# Patient Record
Sex: Female | Born: 1950 | Race: White | Hispanic: No | Marital: Married | Smoking: Never smoker
Health system: Southern US, Community
[De-identification: ages and names within clinical notes are randomized; demographics above are authoritative.]

## PROBLEM LIST (undated history)

## (undated) DIAGNOSIS — N6012 Diffuse cystic mastopathy of left breast: Secondary | ICD-10-CM

## (undated) DIAGNOSIS — E119 Type 2 diabetes mellitus without complications: Secondary | ICD-10-CM

## (undated) DIAGNOSIS — R011 Cardiac murmur, unspecified: Secondary | ICD-10-CM

## (undated) DIAGNOSIS — Z9889 Other specified postprocedural states: Secondary | ICD-10-CM

## (undated) DIAGNOSIS — M1990 Unspecified osteoarthritis, unspecified site: Secondary | ICD-10-CM

## (undated) DIAGNOSIS — Z5189 Encounter for other specified aftercare: Secondary | ICD-10-CM

## (undated) DIAGNOSIS — H269 Unspecified cataract: Secondary | ICD-10-CM

## (undated) DIAGNOSIS — F419 Anxiety disorder, unspecified: Secondary | ICD-10-CM

## (undated) DIAGNOSIS — R112 Nausea with vomiting, unspecified: Secondary | ICD-10-CM

## (undated) DIAGNOSIS — R0600 Dyspnea, unspecified: Secondary | ICD-10-CM

## (undated) DIAGNOSIS — T7840XA Allergy, unspecified, initial encounter: Secondary | ICD-10-CM

## (undated) DIAGNOSIS — K760 Fatty (change of) liver, not elsewhere classified: Secondary | ICD-10-CM

## (undated) DIAGNOSIS — T8859XA Other complications of anesthesia, initial encounter: Secondary | ICD-10-CM

## (undated) DIAGNOSIS — N6011 Diffuse cystic mastopathy of right breast: Secondary | ICD-10-CM

## (undated) DIAGNOSIS — K219 Gastro-esophageal reflux disease without esophagitis: Secondary | ICD-10-CM

## (undated) DIAGNOSIS — R7303 Prediabetes: Secondary | ICD-10-CM

## (undated) DIAGNOSIS — I454 Nonspecific intraventricular block: Secondary | ICD-10-CM

## (undated) DIAGNOSIS — D649 Anemia, unspecified: Secondary | ICD-10-CM

## (undated) DIAGNOSIS — F329 Major depressive disorder, single episode, unspecified: Secondary | ICD-10-CM

## (undated) DIAGNOSIS — R739 Hyperglycemia, unspecified: Secondary | ICD-10-CM

## (undated) DIAGNOSIS — K834 Spasm of sphincter of Oddi: Secondary | ICD-10-CM

## (undated) DIAGNOSIS — Z90710 Acquired absence of both cervix and uterus: Secondary | ICD-10-CM

## (undated) DIAGNOSIS — F32A Depression, unspecified: Secondary | ICD-10-CM

## (undated) HISTORY — DX: Unspecified cataract: H26.9

## (undated) HISTORY — DX: Type 2 diabetes mellitus without complications: E11.9

## (undated) HISTORY — PX: ERCP: SHX60

## (undated) HISTORY — DX: Diffuse cystic mastopathy of left breast: N60.12

## (undated) HISTORY — DX: Depression, unspecified: F32.A

## (undated) HISTORY — PX: MASTECTOMY: SHX3

## (undated) HISTORY — DX: Diffuse cystic mastopathy of left breast: N60.11

## (undated) HISTORY — DX: Unspecified osteoarthritis, unspecified site: M19.90

## (undated) HISTORY — PX: COSMETIC SURGERY: SHX468

## (undated) HISTORY — DX: Acquired absence of both cervix and uterus: Z90.710

## (undated) HISTORY — PX: CHOLECYSTECTOMY: SHX55

## (undated) HISTORY — PX: BREAST SURGERY: SHX581

## (undated) HISTORY — DX: Hyperglycemia, unspecified: R73.9

## (undated) HISTORY — DX: Major depressive disorder, single episode, unspecified: F32.9

## (undated) HISTORY — DX: Encounter for other specified aftercare: Z51.89

## (undated) HISTORY — DX: Gastro-esophageal reflux disease without esophagitis: K21.9

## (undated) HISTORY — DX: Allergy, unspecified, initial encounter: T78.40XA

## (undated) HISTORY — DX: Cardiac murmur, unspecified: R01.1

## (undated) HISTORY — PX: ABDOMINAL HYSTERECTOMY: SHX81

## (undated) HISTORY — PX: TUBAL LIGATION: SHX77

## (undated) HISTORY — DX: Spasm of sphincter of Oddi: K83.4

## (undated) HISTORY — PX: APPENDECTOMY: SHX54

## (undated) HISTORY — PX: CARDIAC CATHETERIZATION: SHX172

## (undated) HISTORY — DX: Fatty (change of) liver, not elsewhere classified: K76.0

## (undated) HISTORY — DX: Diffuse cystic mastopathy of right breast: N60.11

---

## 1978-08-30 DIAGNOSIS — Z90710 Acquired absence of both cervix and uterus: Secondary | ICD-10-CM

## 1978-08-30 HISTORY — DX: Acquired absence of both cervix and uterus: Z90.710

## 1998-03-03 ENCOUNTER — Inpatient Hospital Stay (HOSPITAL_COMMUNITY): Admission: AD | Admit: 1998-03-03 | Discharge: 1998-03-03 | Payer: Self-pay | Admitting: Obstetrics

## 1999-08-06 ENCOUNTER — Other Ambulatory Visit: Admission: RE | Admit: 1999-08-06 | Discharge: 1999-08-06 | Payer: Self-pay | Admitting: Gynecology

## 2000-08-25 ENCOUNTER — Ambulatory Visit (HOSPITAL_BASED_OUTPATIENT_CLINIC_OR_DEPARTMENT_OTHER): Admission: RE | Admit: 2000-08-25 | Discharge: 2000-08-25 | Payer: Self-pay | Admitting: Orthopedic Surgery

## 2000-09-14 ENCOUNTER — Other Ambulatory Visit: Admission: RE | Admit: 2000-09-14 | Discharge: 2000-09-14 | Payer: Self-pay | Admitting: Gynecology

## 2001-11-08 ENCOUNTER — Other Ambulatory Visit: Admission: RE | Admit: 2001-11-08 | Discharge: 2001-11-08 | Payer: Self-pay | Admitting: Gynecology

## 2002-06-13 ENCOUNTER — Encounter: Admission: RE | Admit: 2002-06-13 | Discharge: 2002-06-13 | Payer: Self-pay | Admitting: Family Medicine

## 2002-06-13 ENCOUNTER — Encounter: Payer: Self-pay | Admitting: Family Medicine

## 2002-10-03 ENCOUNTER — Encounter (INDEPENDENT_AMBULATORY_CARE_PROVIDER_SITE_OTHER): Payer: Self-pay | Admitting: Specialist

## 2002-10-03 ENCOUNTER — Ambulatory Visit (HOSPITAL_COMMUNITY): Admission: RE | Admit: 2002-10-03 | Discharge: 2002-10-03 | Payer: Self-pay | Admitting: Gastroenterology

## 2003-01-16 ENCOUNTER — Other Ambulatory Visit: Admission: RE | Admit: 2003-01-16 | Discharge: 2003-01-16 | Payer: Self-pay | Admitting: Gynecology

## 2004-04-28 ENCOUNTER — Other Ambulatory Visit: Admission: RE | Admit: 2004-04-28 | Discharge: 2004-04-28 | Payer: Self-pay | Admitting: Gynecology

## 2004-11-03 ENCOUNTER — Ambulatory Visit: Payer: Self-pay | Admitting: Internal Medicine

## 2005-03-05 ENCOUNTER — Ambulatory Visit: Payer: Self-pay | Admitting: Family Medicine

## 2005-10-05 ENCOUNTER — Ambulatory Visit: Payer: Self-pay | Admitting: Family Medicine

## 2006-01-21 ENCOUNTER — Ambulatory Visit (HOSPITAL_COMMUNITY): Admission: RE | Admit: 2006-01-21 | Discharge: 2006-01-21 | Payer: Self-pay | Admitting: Cardiology

## 2006-01-23 ENCOUNTER — Emergency Department (HOSPITAL_COMMUNITY): Admission: EM | Admit: 2006-01-23 | Discharge: 2006-01-24 | Payer: Self-pay | Admitting: Emergency Medicine

## 2006-02-01 ENCOUNTER — Encounter: Payer: Self-pay | Admitting: Vascular Surgery

## 2006-02-01 ENCOUNTER — Ambulatory Visit (HOSPITAL_COMMUNITY): Admission: RE | Admit: 2006-02-01 | Discharge: 2006-02-01 | Payer: Self-pay | Admitting: Cardiology

## 2006-03-17 ENCOUNTER — Other Ambulatory Visit: Admission: RE | Admit: 2006-03-17 | Discharge: 2006-03-17 | Payer: Self-pay | Admitting: Gynecology

## 2006-09-23 ENCOUNTER — Ambulatory Visit: Payer: Self-pay | Admitting: Family Medicine

## 2006-09-23 LAB — CONVERTED CEMR LAB
ALT: 188 units/L — ABNORMAL HIGH (ref 0–40)
AST: 73 units/L — ABNORMAL HIGH (ref 0–37)
Albumin: 3.8 g/dL (ref 3.5–5.2)
Alkaline Phosphatase: 95 units/L (ref 39–117)
BUN: 10 mg/dL (ref 6–23)
Basophils Absolute: 0 10*3/uL (ref 0.0–0.1)
Basophils Relative: 0.4 % (ref 0.0–1.0)
Bilirubin, Direct: 0.1 mg/dL (ref 0.0–0.3)
CO2: 29 meq/L (ref 19–32)
Calcium: 8.6 mg/dL (ref 8.4–10.5)
Chloride: 105 meq/L (ref 96–112)
Cholesterol: 131 mg/dL (ref 0–200)
Creatinine, Ser: 0.6 mg/dL (ref 0.4–1.2)
Eosinophils Absolute: 0.1 10*3/uL (ref 0.0–0.6)
Eosinophils Relative: 2.9 % (ref 0.0–5.0)
GFR calc Af Amer: 133 mL/min
GFR calc non Af Amer: 110 mL/min
Glucose, Bld: 105 mg/dL — ABNORMAL HIGH (ref 70–99)
HCT: 37.3 % (ref 36.0–46.0)
HDL: 41.6 mg/dL (ref >39.0)
Hemoglobin: 12.6 g/dL (ref 12.0–15.0)
LDL Cholesterol: 73 mg/dL (ref 0–99)
Lymphocytes Relative: 28.8 % (ref 12.0–46.0)
MCHC: 33.6 g/dL (ref 30.0–36.0)
MCV: 86.9 fL (ref 78.0–100.0)
Monocytes Absolute: 0.5 10*3/uL (ref 0.2–0.7)
Monocytes Relative: 10.6 % (ref 3.0–11.0)
Neutro Abs: 3 10*3/uL (ref 1.4–7.7)
Neutrophils Relative %: 57.3 % (ref 43.0–77.0)
Phosphorus: 3.8 mg/dL (ref 2.3–4.6)
Platelets: 281 10*3/uL (ref 150–400)
Potassium: 3.6 meq/L (ref 3.5–5.1)
RBC: 4.3 M/uL (ref 3.87–5.11)
RDW: 13.2 % (ref 11.5–14.6)
Sodium: 140 meq/L (ref 135–145)
Total Bilirubin: 0.7 mg/dL (ref 0.3–1.2)
Total CHOL/HDL Ratio: 3.1
Total Protein: 6.7 g/dL (ref 6.0–8.3)
Triglycerides: 80 mg/dL (ref 0–149)
VLDL: 16 mg/dL (ref 0–40)
WBC: 5.1 10*3/uL (ref 4.5–10.5)

## 2006-09-28 ENCOUNTER — Ambulatory Visit: Payer: Self-pay | Admitting: Family Medicine

## 2006-09-28 LAB — CONVERTED CEMR LAB
ALT: 99 units/L — ABNORMAL HIGH (ref 0–40)
AST: 50 units/L — ABNORMAL HIGH (ref 0–37)
Albumin: 4.6 g/dL (ref 3.5–5.2)
Alkaline Phosphatase: 72 units/L (ref 39–117)
Bilirubin, Direct: 0.1 mg/dL (ref 0.0–0.3)
HCV Ab: NEGATIVE
Hep A IgM: NEGATIVE
Hep B C IgM: NEGATIVE
Hepatitis B Surface Ag: NEGATIVE
Total Bilirubin: 0.6 mg/dL (ref 0.3–1.2)
Total Protein: 8 g/dL (ref 6.0–8.3)

## 2006-09-30 ENCOUNTER — Ambulatory Visit (HOSPITAL_COMMUNITY): Admission: RE | Admit: 2006-09-30 | Discharge: 2006-09-30 | Payer: Self-pay | Admitting: Family Medicine

## 2006-11-15 ENCOUNTER — Encounter: Admission: RE | Admit: 2006-11-15 | Discharge: 2006-11-15 | Payer: Self-pay | Admitting: Gastroenterology

## 2007-01-06 ENCOUNTER — Encounter (INDEPENDENT_AMBULATORY_CARE_PROVIDER_SITE_OTHER): Payer: Self-pay | Admitting: Specialist

## 2007-01-06 ENCOUNTER — Encounter: Payer: Self-pay | Admitting: Family Medicine

## 2007-01-06 ENCOUNTER — Ambulatory Visit (HOSPITAL_COMMUNITY): Admission: RE | Admit: 2007-01-06 | Discharge: 2007-01-06 | Payer: Self-pay | Admitting: Gastroenterology

## 2007-02-12 ENCOUNTER — Emergency Department (HOSPITAL_COMMUNITY): Admission: EM | Admit: 2007-02-12 | Discharge: 2007-02-12 | Payer: Self-pay | Admitting: Family Medicine

## 2007-03-16 ENCOUNTER — Ambulatory Visit (HOSPITAL_COMMUNITY): Admission: RE | Admit: 2007-03-16 | Discharge: 2007-03-16 | Payer: Self-pay | Admitting: Gastroenterology

## 2007-04-27 ENCOUNTER — Other Ambulatory Visit: Admission: RE | Admit: 2007-04-27 | Discharge: 2007-04-27 | Payer: Self-pay | Admitting: Gynecology

## 2007-09-04 ENCOUNTER — Telehealth: Payer: Self-pay | Admitting: Family Medicine

## 2007-09-12 ENCOUNTER — Ambulatory Visit: Payer: Self-pay | Admitting: Internal Medicine

## 2007-09-12 LAB — CONVERTED CEMR LAB: Inflenza A Ag: NEGATIVE

## 2008-01-02 ENCOUNTER — Encounter: Payer: Self-pay | Admitting: Family Medicine

## 2008-03-27 ENCOUNTER — Telehealth (INDEPENDENT_AMBULATORY_CARE_PROVIDER_SITE_OTHER): Payer: Self-pay | Admitting: *Deleted

## 2008-04-06 LAB — CONVERTED CEMR LAB: Pap Smear: NORMAL

## 2008-04-14 ENCOUNTER — Emergency Department (HOSPITAL_COMMUNITY): Admission: EM | Admit: 2008-04-14 | Discharge: 2008-04-14 | Payer: Self-pay | Admitting: Family Medicine

## 2008-04-16 ENCOUNTER — Telehealth: Payer: Self-pay | Admitting: Family Medicine

## 2008-06-20 ENCOUNTER — Ambulatory Visit: Payer: Self-pay | Admitting: Family Medicine

## 2008-06-20 DIAGNOSIS — R1012 Left upper quadrant pain: Secondary | ICD-10-CM | POA: Insufficient documentation

## 2008-06-20 DIAGNOSIS — D485 Neoplasm of uncertain behavior of skin: Secondary | ICD-10-CM | POA: Insufficient documentation

## 2008-06-20 DIAGNOSIS — F329 Major depressive disorder, single episode, unspecified: Secondary | ICD-10-CM | POA: Insufficient documentation

## 2008-06-20 DIAGNOSIS — K219 Gastro-esophageal reflux disease without esophagitis: Secondary | ICD-10-CM | POA: Insufficient documentation

## 2008-06-20 DIAGNOSIS — F3289 Other specified depressive episodes: Secondary | ICD-10-CM | POA: Insufficient documentation

## 2008-06-24 LAB — CONVERTED CEMR LAB
ALT: 27 units/L (ref 0–35)
AST: 23 units/L (ref 0–37)
Albumin: 4.2 g/dL (ref 3.5–5.2)
Alkaline Phosphatase: 55 units/L (ref 39–117)
BUN: 16 mg/dL (ref 6–23)
Basophils Absolute: 0 10*3/uL (ref 0.0–0.1)
Basophils Relative: 0.3 % (ref 0.0–3.0)
Bilirubin, Direct: 0.1 mg/dL (ref 0.0–0.3)
CO2: 30 meq/L (ref 19–32)
Calcium: 9.1 mg/dL (ref 8.4–10.5)
Chloride: 105 meq/L (ref 96–112)
Cholesterol: 204 mg/dL (ref 0–200)
Creatinine, Ser: 0.6 mg/dL (ref 0.4–1.2)
Direct LDL: 106.1 mg/dL
Eosinophils Absolute: 0.2 10*3/uL (ref 0.0–0.7)
Eosinophils Relative: 3.4 % (ref 0.0–5.0)
GFR calc Af Amer: 132 mL/min
GFR calc non Af Amer: 109 mL/min
Glucose, Bld: 99 mg/dL (ref 70–99)
HCT: 37.3 % (ref 36.0–46.0)
HDL: 59.8 mg/dL (ref >39.0)
Hemoglobin: 12.7 g/dL (ref 12.0–15.0)
Lymphocytes Relative: 28.8 % (ref 12.0–46.0)
MCHC: 34.2 g/dL (ref 30.0–36.0)
MCV: 87.2 fL (ref 78.0–100.0)
Monocytes Absolute: 0.4 10*3/uL (ref 0.1–1.0)
Monocytes Relative: 6.9 % (ref 3.0–12.0)
Neutro Abs: 3.3 10*3/uL (ref 1.4–7.7)
Neutrophils Relative %: 60.6 % (ref 43.0–77.0)
Platelets: 268 10*3/uL (ref 150–400)
Potassium: 4 meq/L (ref 3.5–5.1)
RBC: 4.28 M/uL (ref 3.87–5.11)
RDW: 12.7 % (ref 11.5–14.6)
Sodium: 141 meq/L (ref 135–145)
TSH: 1.18 microintl units/mL (ref 0.35–5.50)
Total Bilirubin: 1.1 mg/dL (ref 0.3–1.2)
Total CHOL/HDL Ratio: 3.4
Total Protein: 7.5 g/dL (ref 6.0–8.3)
Triglycerides: 145 mg/dL (ref 0–149)
VLDL: 29 mg/dL (ref 0–40)
WBC: 5.5 10*3/uL (ref 4.5–10.5)

## 2008-07-04 ENCOUNTER — Encounter: Payer: Self-pay | Admitting: Family Medicine

## 2008-08-30 HISTORY — PX: CARPAL TUNNEL RELEASE: SHX101

## 2008-09-12 ENCOUNTER — Encounter: Payer: Self-pay | Admitting: Family Medicine

## 2008-09-16 ENCOUNTER — Encounter: Payer: Self-pay | Admitting: Family Medicine

## 2008-09-26 ENCOUNTER — Encounter: Payer: Self-pay | Admitting: Family Medicine

## 2008-09-26 ENCOUNTER — Ambulatory Visit (HOSPITAL_BASED_OUTPATIENT_CLINIC_OR_DEPARTMENT_OTHER): Admission: RE | Admit: 2008-09-26 | Discharge: 2008-09-26 | Payer: Self-pay | Admitting: Orthopedic Surgery

## 2008-10-08 ENCOUNTER — Telehealth: Payer: Self-pay | Admitting: Family Medicine

## 2009-02-11 ENCOUNTER — Ambulatory Visit: Payer: Self-pay | Admitting: Family Medicine

## 2009-02-11 DIAGNOSIS — W57XXXA Bitten or stung by nonvenomous insect and other nonvenomous arthropods, initial encounter: Secondary | ICD-10-CM | POA: Insufficient documentation

## 2009-02-11 DIAGNOSIS — S90569A Insect bite (nonvenomous), unspecified ankle, initial encounter: Secondary | ICD-10-CM

## 2009-07-01 ENCOUNTER — Telehealth: Payer: Self-pay | Admitting: Family Medicine

## 2009-10-01 ENCOUNTER — Telehealth: Payer: Self-pay | Admitting: Family Medicine

## 2009-12-25 ENCOUNTER — Telehealth: Payer: Self-pay | Admitting: Family Medicine

## 2010-03-12 ENCOUNTER — Ambulatory Visit: Payer: Self-pay | Admitting: Family Medicine

## 2010-03-19 LAB — CONVERTED CEMR LAB
ALT: 41 units/L — ABNORMAL HIGH (ref 0–35)
AST: 28 units/L (ref 0–37)
Albumin: 4.5 g/dL (ref 3.5–5.2)
Alkaline Phosphatase: 61 units/L (ref 39–117)
BUN: 12 mg/dL (ref 6–23)
Basophils Absolute: 0 10*3/uL (ref 0.0–0.1)
Basophils Relative: 0.7 % (ref 0.0–3.0)
Bilirubin, Direct: 0.1 mg/dL (ref 0.0–0.3)
CO2: 29 meq/L (ref 19–32)
Calcium: 9.3 mg/dL (ref 8.4–10.5)
Chloride: 103 meq/L (ref 96–112)
Cholesterol: 205 mg/dL — ABNORMAL HIGH (ref 0–200)
Creatinine, Ser: 0.5 mg/dL (ref 0.4–1.2)
Direct LDL: 124.5 mg/dL
Eosinophils Absolute: 0.3 10*3/uL (ref 0.0–0.7)
Eosinophils Relative: 5.6 % — ABNORMAL HIGH (ref 0.0–5.0)
GFR calc non Af Amer: 130.53 mL/min (ref >60)
Glucose, Bld: 99 mg/dL (ref 70–99)
HCT: 37.8 % (ref 36.0–46.0)
HDL: 64.8 mg/dL (ref >39.00)
Hemoglobin: 12.8 g/dL (ref 12.0–15.0)
Lymphocytes Relative: 35.2 % (ref 12.0–46.0)
Lymphs Abs: 2.2 10*3/uL (ref 0.7–4.0)
MCHC: 33.8 g/dL (ref 30.0–36.0)
MCV: 87.4 fL (ref 78.0–100.0)
Monocytes Absolute: 0.5 10*3/uL (ref 0.1–1.0)
Monocytes Relative: 8.2 % (ref 3.0–12.0)
Neutro Abs: 3.1 10*3/uL (ref 1.4–7.7)
Neutrophils Relative %: 50.3 % (ref 43.0–77.0)
Platelets: 296 10*3/uL (ref 150.0–400.0)
Potassium: 4.7 meq/L (ref 3.5–5.1)
RBC: 4.32 M/uL (ref 3.87–5.11)
RDW: 13.6 % (ref 11.5–14.6)
Sodium: 140 meq/L (ref 135–145)
TSH: 1.41 microintl units/mL (ref 0.35–5.50)
Total Bilirubin: 0.8 mg/dL (ref 0.3–1.2)
Total CHOL/HDL Ratio: 3
Total Protein: 7.6 g/dL (ref 6.0–8.3)
Triglycerides: 158 mg/dL — ABNORMAL HIGH (ref 0.0–149.0)
VLDL: 31.6 mg/dL (ref 0.0–40.0)
WBC: 6.1 10*3/uL (ref 4.5–10.5)

## 2010-05-12 ENCOUNTER — Encounter: Payer: Self-pay | Admitting: Family Medicine

## 2010-06-18 ENCOUNTER — Ambulatory Visit: Payer: Self-pay | Admitting: Family Medicine

## 2010-06-18 DIAGNOSIS — R740 Nonspecific elevation of levels of transaminase and lactic acid dehydrogenase [LDH]: Secondary | ICD-10-CM

## 2010-06-18 DIAGNOSIS — R7401 Elevation of levels of liver transaminase levels: Secondary | ICD-10-CM | POA: Insufficient documentation

## 2010-06-19 LAB — CONVERTED CEMR LAB
ALT: 38 units/L — ABNORMAL HIGH (ref 0–35)
AST: 23 units/L (ref 0–37)
Albumin: 4 g/dL (ref 3.5–5.2)
Alkaline Phosphatase: 56 units/L (ref 39–117)
Bilirubin, Direct: 0.1 mg/dL (ref 0.0–0.3)
Total Bilirubin: 0.6 mg/dL (ref 0.3–1.2)
Total Protein: 6.9 g/dL (ref 6.0–8.3)

## 2010-08-27 ENCOUNTER — Ambulatory Visit
Admission: RE | Admit: 2010-08-27 | Discharge: 2010-08-27 | Payer: Self-pay | Source: Home / Self Care | Attending: Internal Medicine | Admitting: Internal Medicine

## 2010-09-18 ENCOUNTER — Telehealth: Payer: Self-pay | Admitting: Family Medicine

## 2010-09-29 NOTE — Letter (Signed)
Summary: ORTHOPAEDIC & HAND SPEC / NUMBNESS & TINGLING IN LEFT HAND / DR.  Leslie Dales & HAND SPEC / NUMBNESS & TINGLING IN LEFT HAND / DR. GARY KUZMA   Imported By: Carin Primrose 09/19/2008 14:44:03  _____________________________________________________________________  External Attachment:    Type:   Image     Comment:   External Document

## 2010-09-29 NOTE — Miscellaneous (Signed)
Summary: refil celexa  Clinical Lists Changes  Medications: Rx of CELEXA 20 MG  TABS (CITALOPRAM HYDROBROMIDE) 1 by mouth qd;  #90 x 3;  Signed;  Entered by: Judith Part MD;  Authorized by: Colon Flattery Tower MD;  Method used: Printed then faxed to    Prescriptions: CELEXA 20 MG  TABS (CITALOPRAM HYDROBROMIDE) 1 by mouth qd  #90 x 3   Entered and Authorized by:   Judith Part MD   Signed by:   Judith Part MD on 01/02/2008   Method used:   Printed then faxed to ...         RxID:   [id number]   Appended Document: refil celexa faxed to [facility] 01/02/08

## 2010-09-29 NOTE — Letter (Signed)
Summary: Beather Arbour MD  Beather Arbour MD   Imported By: Lanelle Bal 05/27/2010 12:01:54  _____________________________________________________________________  External Attachment:    Type:   Image     Comment:   External Document

## 2010-09-29 NOTE — Procedures (Signed)
Summary: Gastroenterology  Gastroenterology   Imported By: Beau Fanny 01/13/2007 15:16:45  _____________________________________________________________________  External Attachment:    Type:   Image     Comment:   External Document

## 2010-09-29 NOTE — Progress Notes (Signed)
Summary: black widow bite (notes in inbox)  Phone Note Call from Patient   Caller: Patient Call For: dr tower Summary of Call: pt states she was bit by a black widow on sunday-  she went to urgent care and was given script for doxycyline and was told not to take it unless she started having signs of infection.  she says she has started having tingling in her legs and feet, itching on the bottom of her feet. I told her to go ahead and start the abx and to call back if further problems- I told her I would check with you Initial call taken by: Lowella Petties,  April 16, 2008 12:01 PM  Follow-up for Phone Call        most troublesome symptom of black widow bite is generally severe pain at bite site with migrating muscle spasms  doxycycline would cover infection-- go ahead and take as directed f/u with me if not improved in the next day or if increased redness at bite or fever  please send for UC note if availible- thanks  Follow-up by: Judith Part MD,  April 16, 2008 1:28 PM  Additional Follow-up for Phone Call Additional follow up Details #1::        Advised patient.  ......................................................Marland KitchenLiane Comber April 16, 2008 2:25 PM u/c notes printed Additional Follow-up by: Liane Comber,  April 16, 2008 2:25 PM

## 2010-09-29 NOTE — Progress Notes (Signed)
Summary: Cyclobenzaprine 10mg  refill  Phone Note Refill Request Call back at [phone] Message from:  [facility] on October 01, 2009 9:13 AM  Refills Requested: Medication #1:  FLEXERIL 10 MG TABS one tab by mouth three times a day as needed   Last Refilled: 06/18/2009 [name] request refill for Cyclobenzaprine 10mg .Please advise.    Method Requested: Electronic Initial call taken by: Lewanda Rife LPN,  October 01, 2009 9:14 AM  Follow-up for Phone Call        px written on EMR for call in  Follow-up by: Judith Part MD,  October 01, 2009 10:12 AM    New/Updated Medications: FLEXERIL 10 MG TABS (CYCLOBENZAPRINE HCL) one tab by mouth three times a day as needed Prescriptions: FLEXERIL 10 MG TABS (CYCLOBENZAPRINE HCL) one tab by mouth three times a day as needed  #90 x 3   Entered by:   Lowella Petties CMA   Authorized by:   Judith Part MD   Signed by:   Lowella Petties CMA on 10/01/2009   Method used:   Electronically to        [facility]* (retail)       [address]       [address] Shipping/mailing       [place], [place]  [address]       Ph: [phone]       Fax: [phone]   RxID:   [id number] FLEXERIL 10 MG TABS (CYCLOBENZAPRINE HCL) one tab by mouth three times a day as needed  #90 x 3   Entered and Authorized by:   Judith Part MD   Signed by:   Judith Part MD on 10/01/2009   Method used:   Telephoned to ...       [facility]* (retail)       [address]       [address] Shipping/mailing       [place], [place]  [address]       Ph: [phone]       Fax: [phone]   RxID:   [id number]

## 2010-09-29 NOTE — Progress Notes (Signed)
  Phone Note Call from Patient Call back at [phone]   Caller: Patient Summary of Call: Pt is having back pain and needs some flexeril called in to her pharmacy at [facility] at [phone]. Pt actually was taking care of Dr Milinda Antis today and she told her to call that someone would do this for her patient. If this will be called after 5:30 please just call it in to [facility]. Initial call taken by: Carlton Adam,  October 08, 2008 3:26 PM  Follow-up for Phone Call        [facility] Follow-up by: Providence Crosby,  October 08, 2008 3:34 PM    New/Updated Medications: FLEXERIL 10 MG TABS (CYCLOBENZAPRINE HCL) one tab by mouth three times a day   Prescriptions: FLEXERIL 10 MG TABS (CYCLOBENZAPRINE HCL) one tab by mouth three times a day  #50 x 0   Entered and Authorized by:   Shaune Leeks MD   Signed by:   Shaune Leeks MD on 10/08/2008   Method used:   Print then Give to Patient   RxID:   [id number]

## 2010-09-29 NOTE — Assessment & Plan Note (Signed)
Summary: CPX NO PAP/RBH   Vital Signs:  Patient Profile:   60 Years Old Female Height:     63.5 inches Weight:      157 pounds BMI:     27.47 Temp:     98.1 degrees F oral Pulse rate:   68 / minute Pulse rhythm:   regular BP sitting:   132 / 80  (left arm) Cuff size:   regular  Vitals Entered By: Liane Comber (June 20, 2008 10:13 AM)                 Serial Vital Signs/Assessments:  Time      Position  BP       Pulse  Resp  Temp     By                     125/70                         Judith Part MD   PCP:  [facility]  Chief Complaint:  cpx.  History of Present Illness: has had flu shot at the hospital  continues to have some epigastric pain on and off - and some diarrhea yesterday has not been to GI since ERCP in july 08  symptoms are not severe- but still present  no change in reflux med- and not heartburn   had last gyn aug of 08-- is going set up her own gyn appt to get caught up  has had bilat subcutanous mastectomy-- does not get mammograms any longer   is getting started back to walking -- had gotten out of the habit   wt is up about 4 lb   thinks colonosc 2007  takes ca and gets 2000 international units vit D with that per day  lipitor-- rash, zetia non tolerant in past (Dr Deborah Chalk) thinks her chol will be up  has lesion on L neck-- ? bug bite, keeps scratching at it and will not go away        Current Allergies (reviewed today): ! SEPTRA (SULFAMETHOXAZOLE-TRIMETHOPRIM) ! LIPITOR (ATORVASTATIN CALCIUM)  Past Medical History:    Reviewed history and no changes required:       Depression/ anxiety       GERD       Fatty Liver       Sphincter of oddi- sphincteritis                            gyn-- Dr Nicholas Lose   Past Surgical History:    Reviewed history and no changes required:       TAH/ oophrectomy       Choel/ biopsy       B Mastectomy       1998- EGD- Normal       09/2002-Colonoscopy- hem, polyps       09/2002- EGD- normal      02/2005- R ankle sprain       12/2005- Cardiolite       2 D Echo- mild arterial sclerosis       12/2006- EGD- peptic polyps, normal       02/2007- duoenoscope- normal       ERCP       bilateral subcutaneous mastectomy    Family History:    Father- FM, BP    Mothe- Ulcers    Grandmother- CAD    Uncle-DM  aunt with pancreatic cancer   Social History:    Reviewed history and no changes required:       Occupation:Nurse       Never Smoked       Alcohol use-yes       Drug use-no       Married   Risk Factors:  Tobacco use:  never Drug use:  no Alcohol use:  yes  PAP Smear History:     Date of Last PAP Smear:  06/20/2008    Results:  02/2006   Colonoscopy History:     Date of Last Colonoscopy:  06/20/2008    Results:  2/200   PAP Smear History:     Date of Last PAP Smear:  04/06/2008    Results:  normal   PAP Smear History:     Date of Last PAP Smear:  02/27/2006    Results:  09/2005   Colonoscopy History:     Date of Last Colonoscopy:  10/03/2002    Results:  Adenomatous Polyp    Review of Systems  General      Denies fatigue, fever, loss of appetite, and malaise.  Eyes      Denies blurring, eye irritation, and eye pain.  CV      Denies chest pain or discomfort, palpitations, and shortness of breath with exertion.  Resp      Denies cough and wheezing.  GI      Complains of abdominal pain and diarrhea.      Denies bloody stools, change in bowel habits, gas, indigestion, nausea, vomiting, and yellowish skin color.  GU      Denies discharge, dysuria, and incontinence.  MS      Denies joint pain, joint redness, and joint swelling.  Derm      Complains of itching and lesion(s).      Denies rash.  Neuro      Denies numbness and tingling.  Psych      mood is overall stable   Endo      Denies excessive thirst and excessive urination.  Heme      Denies abnormal bruising and bleeding.   Physical Exam  General:     alert, well-developed,  well-nourished, well-hydrated, appropriate dress, normal appearance, healthy-appearing, cooperative to examination, and good hygiene.   Head:     normocephalic, atraumatic, and no abnormalities observed.   Eyes:     vision grossly intact, pupils equal, pupils round, and pupils reactive to light.   Ears:     R ear normal and L ear normal.   Nose:     no nasal discharge.   Mouth:     pharynx pink and moist.   Neck:     supple with full rom and no masses or thyromegally, no JVD or carotid bruit  Chest Wall:     No deformities, masses, or tenderness noted. Lungs:     Normal respiratory effort, chest expands symmetrically. Lungs are clear to auscultation, no crackles or wheezes. Heart:     Normal rate and regular rhythm. S1 and S2 normal without gallop, murmur, click, rub or other extra sounds. Abdomen:     Bowel sounds positive,abdomen soft and non-tender without masses, organomegaly or hernias noted. Msk:     No deformity or scoliosis noted of thoracic or lumbar spine.  no acute joint changes, full rom of all joints  Pulses:     R and L carotid,radial,femoral,dorsalis pedis and posterior tibial pulses are full and  equal bilaterally Extremities:     No clubbing, cyanosis, edema, or deformity noted with normal full range of motion of all joints.   Neurologic:     sensation intact to light touch, gait normal, and DTRs symmetrical and normal.   Skin:     papule on L neck with some scale - excoriated  slt redness  some solar lentigos no rash Cervical Nodes:     No lymphadenopathy noted Inguinal Nodes:     No significant adenopathy Psych:     normal affect, talkative and pleasant     Impression & Recommendations:  Problem # 1:  HEALTH MAINTENANCE EXAM (ICD-V70.0) Assessment: Comment Only reviewed health habits including diet, exercise and skin cancer prevention reviewed health maintenance list and family history labs today   Orders: Venipuncture (14782) TLB-Lipid Panel  (80061-LIPID) TLB-BMP (Basic Metabolic Panel-BMET) (80048-METABOL) TLB-CBC Platelet - w/Differential (85025-CBCD) TLB-Hepatic/Liver Function Pnl (80076-HEPATIC) TLB-TSH (Thyroid Stimulating Hormone) (84443-TSH)   Problem # 2:  ABDOMINAL PAIN, LEFT UPPER QUADRANT (ICD-789.02) Assessment: Improved improved but not totally resolved after removal of CBD stone  will check labs incl cbc and lfts today ref to GI for re eval  adv to update if symptoms worsen or any fever Orders: Gastroenterology Referral (GI)   Problem # 3:  NEOPLASM OF UNCERTAIN BEHAVIOR OF SKIN (ICD-238.2) Assessment: New papular lesion on L neck that will not heal will ref to derm for further eval and also skin screening Orders: Dermatology Referral (Derma)   Complete Medication List: 1)  Celexa 20 Mg Tabs (Citalopram hydrobromide) .Marland Kitchen.. 1 by mouth qd 2)  Nexium 40 Mg Cpdr (Esomeprazole magnesium) .... Take one by mouth two times a day 3)  Tylenol Extra Strength 500 Mg Tabs (Acetaminophen) .... Take one every 4 hours as needed 4)  Vivelle-dot 0.075 Mg/24hr Pttw (Estradiol) .... 1/2 patch applied twice a week 5)  Vitamin D  .... Daily 6)  Calcium  .... Daily   Patient Instructions: 1)  we will refer you to GI at check out  2)  we will refer you to derm at check out  3)  the current recommendation for calcium intake is 1200-1500 mg daily with [id number] IU of vitamin D  4)  follow up with Dr Nicholas Lose for gyn exam 5)  wellness labs today    ]  Preventive Care Screening  Colonoscopy:    Date:  10/03/2002    Next Due:  06/2013    Results:  Adenomatous Polyp   Last Flu Shot:    Date:  05/30/2008    Results:  given   Pap Smear:    Date:  04/06/2008    Results:  normal   Last Tetanus Booster:    Date:  08/30/2002    Results:  given   Bone Density:    Date:  08/30/2002    Results:  normal std dev

## 2010-09-29 NOTE — Progress Notes (Signed)
Summary: Celexa and Nexium refills  Phone Note Refill Request Call back at [phone] Message from:  [facility] on December 25, 2009 9:55 AM  Refills Requested: Medication #1:  CELEXA 20 MG  TABS 1 by mouth every day   Last Refilled: 10/01/2009  Medication #2:  NEXIUM 40 MG  CPDR Take one by mouth two times a day   Last Refilled: 10/01/2009 [name] request refills on Celexa 20mg  #90 and Nexium 40mg  #180. Pt last CPX 06/20/2008. Please advise.    Method Requested: Telephone to Pharmacy Initial call taken by: Lewanda Rife LPN,  December 25, 2009 9:57 AM  Follow-up for Phone Call        I think she may be overdue for check up -- please sched that  Follow-up by: Judith Part MD,  December 25, 2009 1:02 PM  Additional Follow-up for Phone Call Additional follow up Details #1::        Pt scheduled CPX on 07/14/11at 10:30am with Dr. Milinda Antis. Medication phoned to [facility] outpatient pharmacy (spoke w ith Velna Hatchet) as instructed. Lewanda Rife LPN  December 25, 2009 3:46 PM     Prescriptions: NEXIUM 40 MG  CPDR (ESOMEPRAZOLE MAGNESIUM) Take one by mouth two times a day  #180 x 0   Entered and Authorized by:   Judith Part MD   Signed by:   Lewanda Rife LPN on 45/40/9811   Method used:   Telephoned to ...       [name]* (retail)       [address]       [address] Shipping/mailing       [place], [place]  [address]       Ph: [phone]       Fax: [phone]   RxID:   [id number] CELEXA 20 MG  TABS (CITALOPRAM HYDROBROMIDE) 1 by mouth every day  #90 x 0   Entered and Authorized by:   Judith Part MD   Signed by:   Lewanda Rife LPN on 66/44/0347   Method used:   Telephoned to ...       [facility]* (retail)       [address]       [address] Shipping/mailing       [place], [place]  [address]       Ph: [phone]       Fax: [phone]   RxID:   [id number]

## 2010-09-29 NOTE — Op Note (Signed)
Summary: [facility]/Decompression,left Median Nerve/Dr. Jonette Mate [facility]/Decompression,left Median Nerve/Dr. Merlyn Lot   Imported By: Eleonore Chiquito 10/02/2008 13:38:49  _____________________________________________________________________  External Attachment:    Type:   Image     Comment:   External Document

## 2010-09-29 NOTE — Consult Note (Signed)
Summary: [facility]. [facility]/Dr. Loreta Ave   Imported By: Eleonore Chiquito 07/08/2008 15:33:37  _____________________________________________________________________  External Attachment:    Type:   Image     Comment:   External Document

## 2010-09-29 NOTE — Progress Notes (Signed)
Summary: 90 day rx  Phone Note Call from Patient   Caller: Patient Call For: [facility] Summary of Call: pt dropped off forms she wants a 90 day rx for a generic alternative to lexapro prefered alternatives are citalopram20mg  qd, fluoxetine20mg  qd,fluvoxamine 50mg  once daily, paroxetine 20mg  once daily, paroxetine er 25mg  once daily, sertraline 50mg  once daily. are any of these appropiate? Initial call taken by: Liane Comber,  September 04, 2007 3:47 PM  Follow-up for Phone Call        none of them will be exactly the same closest would be generic celexa 20 printed and in my out box for pick up  let me know if side effects or other problems Follow-up by: Judith Part MD,  September 04, 2007 5:14 PM  Additional Follow-up for Phone Call Additional follow up Details #1::        advised pt, she will pick up script and forms tomorrow Additional Follow-up by: Lowella Petties,  September 04, 2007 5:21 PM    New/Updated Medications: CELEXA 20 MG  TABS (CITALOPRAM HYDROBROMIDE) 1 by mouth qd   Prescriptions: CELEXA 20 MG  TABS (CITALOPRAM HYDROBROMIDE) 1 by mouth qd  #90 x 3   Entered and Authorized by:   Judith Part MD   Signed by:   Judith Part MD on 09/04/2007   Method used:   Print then Give to Patient   RxID:   [id number]

## 2010-09-29 NOTE — Assessment & Plan Note (Signed)
Summary: HIGH FEVER,COUGH/CLE   Vital Signs:  Patient Profile:   60 Years Old Female Weight:      153 pounds Temp:     101.3 degrees F oral Pulse rate:   92 / minute Pulse rhythm:   regular Resp:     20 per minute BP sitting:   110 / 60  (right arm) Cuff size:   regular  Vitals Entered By: Sydell Axon (September 12, 2007 2:37 PM)                  Visit Type:  Acute PCP:  [facility]  Chief Complaint:  High fever and cough.  History of Present Illness: Onset from late Sunday night with chills and feeling bad. Had gotten up and got ready for work and realized that she was too sick to go. Has been bed all day yesterday and then today until getting up and coming for her appointment. Hears crackles when she lies down in bed. Has taken some tylenol & delsym. When she coughs, it feels like her head is going to split in to. Has has some post nasal drainage but nothing from her nose she could blow and clear it. Hurts and aches all over.  Collaborated with Dr. Milinda Antis and treatment plan decided upon.  Current Allergies (reviewed today): ! SEPTRA (SULFAMETHOXAZOLE-TRIMETHOPRIM) ! LIPITOR (ATORVASTATIN CALCIUM) Updated/Current Medications (including changes made in today's visit):  CELEXA 20 MG  TABS (CITALOPRAM HYDROBROMIDE) 1 by mouth qd NEXIUM 40 MG  CPDR (ESOMEPRAZOLE MAGNESIUM) Take one by mouth two times a day TYLENOL EXTRA STRENGTH 500 MG  TABS (ACETAMINOPHEN) Take one every 4 hours as needed BIAXIN XL 500 MG  TB24 (CLARITHROMYCIN) Take 2 tabs once a day x 10 days. * CODICLEAR- DH Take 1 tsp every 4-6 hours as needed cough.      Review of Systems       The patient complains of fever and prolonged cough.  The patient denies decreased hearing, hoarseness, chest pain, syncope, dyspnea on exhertion, peripheral edema, hemoptysis, abdominal pain, and severe indigestion/heartburn.         has significant pain when she coughs, feels like head is coming in to.   Physical  Exam  General:     alert, well-developed, well-nourished, well-hydrated, appropriate dress, normal appearance, healthy-appearing, cooperative to examination, and good hygiene.   Ears:     TM's negative. Nose:     Mucus membranes are pink and moist with slight edema of the turbinates. Mouth:     Posterior pharnyx is slightly errythematous. No lesions or ulcerations. No significant post-nasal drainage.  Neck:     supple and full ROM.   Lungs:     normal respiratory effort and no accessory muscle use.  Left lung, mid to lower lung field is slightly decreased but air is moving into the base. No rales, rhonchi or wheezes. Heart:     normal rate and regular rhythm.   Neurologic:     alert & oriented X3.   Skin:     turgor normal, color normal, no rashes, and no suspicious lesions.  warm to touch. Cervical Nodes:     no anterior cervical adenopathy and no posterior cervical adenopathy.   Psych:     memory intact for recent and remote, normally interactive, good eye contact, not anxious appearing, and not depressed appearing.      Impression & Recommendations:  Problem # 1:  URI (ICD-465.9) Biaxin XL 500 mg- Take 2 tabs once a day x  10 days. Codaclear- DH- Take 1 tsp every 4-6 hours as needed. Delsym as needed every 12 hours. Her updated medication list for this problem includes:    Tylenol Extra Strength 500 Mg Tabs (Acetaminophen) .Marland Kitchen... Take one every 4 hours as needed  Orders: Flu A+B (04540)   Complete Medication List: 1)  Celexa 20 Mg Tabs (Citalopram hydrobromide) .Marland Kitchen.. 1 by mouth qd 2)  Nexium 40 Mg Cpdr (Esomeprazole magnesium) .... Take one by mouth two times a day 3)  Tylenol Extra Strength 500 Mg Tabs (Acetaminophen) .... Take one every 4 hours as needed 4)  Biaxin Xl 500 Mg Tb24 (Clarithromycin) .... Take 2 tabs once a day x 10 days. 5)  Codiclear- Dh  .... Take 1 tsp every 4-6 hours as needed cough.   Patient Instructions: 1)  Biaxin XL 500 mg- Take 2 tabs once a  day x 10 days. 2)  Codaclear- DH- Take 1 tsp every 4-6 hours as needed. 3)  Delsym as needed every 12 hours.    Prescriptions: BIAXIN XL 500 MG  TB24 (CLARITHROMYCIN) Take 2 tabs once a day x 10 days.  #20 x 0   Entered and Authorized by:   Kathie Rhodes NP   Signed by:   Kathie Rhodes NP on 09/12/2007   Method used:   Print then Give to Patient   RxID:   [id number] CODICLEAR- DH Take 1 tsp every 4-6 hours as needed cough.  #8 oz x 0   Entered and Authorized by:   Kathie Rhodes NP   Signed by:   Kathie Rhodes NP on 09/12/2007   Method used:   Print then Give to Patient   RxID:   [id number] CODACLEAR- DH Take 1 tsp every 4-6 hours as needed cough.  #8 oz x 0   Entered and Authorized by:   Kathie Rhodes NP   Signed by:   Kathie Rhodes NP on 09/12/2007   Method used:   Print then Give to Patient   RxID:   [id number] BIAXIN XL 500 MG  TB24 (CLARITHROMYCIN) Take 2 tabs once a day x 10 days.  #20 x 0   Entered and Authorized by:   Kathie Rhodes NP   Signed by:   Kathie Rhodes NP on 09/12/2007   Method used:   Print then Give to Patient   RxID:   [id number] CODACLEAR- DH Take 1 tsp every 4-6 hours as needed cough.  #8 oz x 0   Entered and Authorized by:   Kathie Rhodes NP   Signed by:   Kathie Rhodes NP on 09/12/2007   Method used:   Print then Give to Patient   RxID:   [id number] CODACLEAR- DH Take 1 tsp every 12 hours as needed cough.  #8 oz x 0   Entered and Authorized by:   Kathie Rhodes NP   Signed by:   Kathie Rhodes NP on 09/12/2007   Method used:   Print then Give to Patient   RxID:   [id number] BIAXIN XL 500 MG  TB24 (CLARITHROMYCIN) Take 2 tabs once a day x 10 days.  #20 x 0   Entered and Authorized by:   Kathie Rhodes NP   Signed by:   Kathie Rhodes NP on 09/12/2007   Method used:   Print then Give to Patient   RxID:   [id number]  ] Current Allergies  (reviewed today): ! SEPTRA (SULFAMETHOXAZOLE-TRIMETHOPRIM) !  LIPITOR (ATORVASTATIN CALCIUM)  Laboratory Results  Date/Time Received: 09-12-2007 Date/Time Reported: Same  Other Tests  Influenza: negative

## 2010-09-29 NOTE — Progress Notes (Signed)
Summary: refill request for celexa  Phone Note Refill Request Message from:  Fax from Pharmacy  Refills Requested: Medication #1:  CELEXA 20 MG  TABS 1 by mouth every day   Last Refilled: 04/08/2009 Faxed request from [facility], phone [phone]  Initial call taken by: Lowella Petties CMA,  July 01, 2009 10:52 AM  Follow-up for Phone Call        px written on EMR for call in  Follow-up by: Judith Part MD,  July 01, 2009 11:48 AM  Additional Follow-up for Phone Call Additional follow up Details #1::        Medication phoned to pharmacy.  Additional Follow-up by: Delilah Shan CMA (AAMA),  July 01, 2009 12:40 PM    Prescriptions: CELEXA 20 MG  TABS (CITALOPRAM HYDROBROMIDE) 1 by mouth every day  #30 x 5   Entered and Authorized by:   Judith Part MD   Signed by:   Delilah Shan CMA (AAMA) on 07/01/2009   Method used:   Telephoned to ...         RxID:   [id number]

## 2010-09-29 NOTE — Progress Notes (Signed)
Summary: nexium refill  Phone Note Refill Request Message from:  Fax from Pharmacy on March 27, 2008 12:23 PM  Refills Requested: Medication #1:  NEXIUM 40 MG  CPDR Take one by mouth two times a day  Method Requested: Fax to Local Pharmacy Initial call taken by: Liane Comber,  March 27, 2008 12:23 PM      Prescriptions: NEXIUM 40 MG  CPDR (ESOMEPRAZOLE MAGNESIUM) Take one by mouth two times a day  #180 x 1   Entered and Authorized by:   Liane Comber   Signed by:   Liane Comber on 03/27/2008   Method used:   Telephoned to ...         RxID:   [id number]

## 2010-09-29 NOTE — Assessment & Plan Note (Signed)
Summary: CPX NO PAP/RI   Vital Signs:  Patient profile:   60 year old female Height:      63.5 inches Weight:      165.75 pounds BMI:     29.01 Temp:     98.1 degrees F oral Pulse rate:   84 / minute Pulse rhythm:   regular BP sitting:   114 / 66  (left arm) Cuff size:   regular  Vitals Entered By: Lewanda Rife LPN (March 12, 2010 10:40 AM) CC: CPX GYN does pap and breast exam   History of Present Illness: here for health mt exam and to disc chronic health problems   has been feeling ok  nothing new   wt is up 4 lb  bp is good 114/66  mood - has been pretty stable   is not doing regular exercise but a lot of work outdoors  is eating a very healthy diet  needs labs -- is fasting   tot hyst in past - nl pap in 09 sees Dr Nicholas Lose yearly -- was last spring- will make appt  keeps an eye on her  is doing ok on her hormones -- for her bones -- and feels better with them    bilat mastecomy in past - no mammograms   colonosc was 04-- adv f/u in 10 y saw Dr Loreta Ave   dexa nl in 04 ca and D-- is good about taking those    Td 04  zoster status   Allergies: 1)  ! Septra (Sulfamethoxazole-Trimethoprim) 2)  ! Lipitor (Atorvastatin Calcium) 3)  ! Zetia (Ezetimibe)  Past History:  Past Medical History: Last updated: 06/20/2008 Depression/ anxiety GERD Fatty Liver Sphincter of oddi- sphincteritis    gyn-- Dr Nicholas Lose   Past Surgical History: Last updated: 09/28/2008 TAH/ oophrectomy Choel/ biopsy B Mastectomy 1998- EGD- Normal 09/2002-Colonoscopy- hem, polyps 09/2002- EGD- normal 02/2005- R ankle sprain 12/2005- Cardiolite 2 D Echo- mild arterial sclerosis 12/2006- EGD- peptic polyps, normal 02/2007- duoenoscope- normal ERCP bilateral subcutaneous mastectomy  01/10 L carpal tunnel surgery  Family History: Last updated: 06/20/2008 Father- FM, BP Mothe- Ulcers Grandmother- CAD Uncle-DM aunt with pancreatic cancer   Social History: Last updated:  06/20/2008 Occupation:Nurse Never Smoked Alcohol use-yes Drug use-no Married  Risk Factors: Smoking Status: never (06/20/2008)  Review of Systems General:  Denies fatigue, fever, loss of appetite, and malaise. Eyes:  Denies blurring and eye irritation. CV:  Denies chest pain or discomfort and lightheadness. Resp:  Denies cough, shortness of breath, and wheezing. GI:  Denies abdominal pain, change in bowel habits, indigestion, and nausea. MS:  Denies joint pain, joint redness, joint swelling, cramps, and muscle weakness. Derm:  Denies itching, lesion(s), poor wound healing, and rash. Neuro:  Denies numbness and tingling. Psych:  Denies anxiety and depression. Endo:  Denies excessive thirst and excessive urination. Heme:  Denies abnormal bruising and bleeding.  Physical Exam  General:  overweight but generally well appearing  Head:  normocephalic, atraumatic, and no abnormalities observed.   Eyes:  vision grossly intact, pupils equal, pupils round, and pupils reactive to light.  no conjunctival pallor, injection or icterus  Ears:  R ear normal and L ear normal.   Nose:  no nasal discharge.   Mouth:  pharynx pink and moist.   Neck:  supple with full rom and no masses or thyromegally, no JVD or carotid bruit  Chest Wall:  No deformities, masses, or tenderness noted. Lungs:  Normal respiratory effort, chest expands symmetrically. Lungs  are clear to auscultation, no crackles or wheezes. Heart:  Normal rate and regular rhythm. S1 and S2 normal without gallop, murmur, click, rub or other extra sounds. Abdomen:  Bowel sounds positive,abdomen soft and non-tender without masses, organomegaly or hernias noted. no renal bruits  Msk:  No deformity or scoliosis noted of thoracic or lumbar spine.  no acute joint changes  Pulses:  R and L carotid,radial,femoral,dorsalis pedis and posterior tibial pulses are full and equal bilaterally Extremities:  No clubbing, cyanosis, edema, or deformity noted  with normal full range of motion of all joints.   Neurologic:  sensation intact to light touch, gait normal, and DTRs symmetrical and normal.   Skin:  Intact without suspicious lesions or rashes lentigos diffusely  Cervical Nodes:  No lymphadenopathy noted Inguinal Nodes:  No significant adenopathy Psych:  normal affect, talkative and pleasant    Impression & Recommendations:  Problem # 1:  HEALTH MAINTENANCE EXAM (ICD-V70.0) reviewed health habits including diet, exercise and skin cancer prevention reviewed health maintenance list and family history  she will f/u with gyn for gyn care  interested in zostavax- will check with ins and call back to schedule Orders: Venipuncture ([address]) TLB-Lipid Panel (80061-LIPID) TLB-BMP (Basic Metabolic Panel-BMET) (80048-METABOL) TLB-CBC Platelet - w/Differential (85025-CBCD) TLB-Hepatic/Liver Function Pnl (80076-HEPATIC) TLB-TSH (Thyroid Stimulating Hormone) (60454-UJW) Prescription Created Electronically [phone])  Problem # 2:  GERD (ICD-530.81) well controled with nexium two times a day  med refilled  disc diet Her updated medication list for this problem includes:    Nexium 40 Mg Cpdr (Esomeprazole magnesium) .Marland Kitchen... Take one by mouth two times a day  Orders: Venipuncture ([address]) TLB-Lipid Panel (80061-LIPID) TLB-BMP (Basic Metabolic Panel-BMET) (80048-METABOL) TLB-CBC Platelet - w/Differential (85025-CBCD) TLB-Hepatic/Liver Function Pnl (80076-HEPATIC) TLB-TSH (Thyroid Stimulating Hormone) (62130-QMV) Prescription Created Electronically [id number])  Problem # 3:  DEPRESSION (ICD-311)  doing well with celexa encouraged exercise  Her updated medication list for this problem includes:    Celexa 20 Mg Tabs (Citalopram hydrobromide) .Marland Kitchen... 1 by mouth every day  Orders: Prescription Created Electronically [phone])  Complete Medication List: 1)  Celexa 20 Mg Tabs (Citalopram hydrobromide) .Marland Kitchen.. 1 by mouth every day 2)  Nexium 40 Mg Cpdr  (Esomeprazole magnesium) .... Take one by mouth two times a day 3)  Tylenol Extra Strength 500 Mg Tabs (Acetaminophen) .... Take one every 4 hours as needed 4)  Vivelle-dot 0.075 Mg/24hr Pttw (Estradiol) .... 1/2 patch applied twice a week 5)  Flexeril 10 Mg Tabs (Cyclobenzaprine hcl) .... One tab by mouth three times a day as needed 6)  Aspirin 81 Mg Tabs (Aspirin) .... One daily 7)  Clarinex 5 Mg Tabs (Desloratadine) .Marland Kitchen.. 1 once daily as needed 8)  Unisom 25 Mg Tabs (Doxylamine succinate (sleep)) .... Otc as directed. 9)  Melatonin 10mg   .... Otc as directed. 10)  Vitamin D-3 5000 Unit Tabs (Cholecalciferol) .... Take 1 tablet by mouth once a day 11)  Calcium Carbonate-vitamin D 600-400 Mg-unit Tabs (Calcium carbonate-vitamin d) .... Take 1 tablet by mouth once a day 12)  Fish Oil  .... One pill by mouth once daily  Patient Instructions: 1)  if you are interested in shingles vaccine- check with your insurance company and then call us to schedule it  2)  labs today  3)  follow up with your gyn as planned 4)  I sent px to the pharmacy  Prescriptions: NEXIUM 40 MG  CPDR (ESOMEPRAZOLE MAGNESIUM) Take one by mouth two times a day  #180 x 3  Entered and Authorized by:   Judith Part MD   Signed by:   Judith Part MD on 03/12/2010   Method used:   Electronically to        [facility]* (retail)       [address]       [address] Shipping/mailing       [place], [place]  [address]       Ph: [phone]       Fax: [phone]   RxID:   [id number] CELEXA 20 MG  TABS (CITALOPRAM HYDROBROMIDE) 1 by mouth every day  #90 x 3   Entered and Authorized by:   Judith Part MD   Signed by:   Judith Part MD on 03/12/2010   Method used:   Electronically to        [facility]* (retail)       [address]       [address] [place] [address]mail[address]       [place], [place]  [address]       Ph: [phone]       Fax: [phone]   RxID:    [id number]   Current Allergies (reviewed today): ! SEPTRA (SULFAMETHOXAZOLE-TRIMETHOPRIM) ! LIPITOR (ATORVASTATIN CALCIUM) ! ZETIA (EZETIMIBE)

## 2010-09-29 NOTE — Letter (Signed)
Summary: 2/2//10/[facility]/Dr. Tommye Standard RN  2/2//10/[facility]/Dr. Tommye Standard RN   Imported By: Eleonore Chiquito 10/15/2008 14:58:55  _____________________________________________________________________  External Attachment:    Type:   Image     Comment:   External Document

## 2010-09-29 NOTE — Assessment & Plan Note (Signed)
Summary: BUG BITE ON L ANKLE/CLE   Vital Signs:  Patient profile:   60 year old female Height:      63.25 inches Weight:      161.25 pounds BMI:     28.44 Temp:     98.6 degrees F oral Pulse rate:   80 / minute Pulse rhythm:   regular BP sitting:   118 / 72  (left arm) Cuff size:   regular  Vitals Entered By: Lewanda Rife (February 11, 2009 2:15 PM)  Primary Care Provider:  [facility]  CC:  bug bite left ankle on 02/09/09. Now feels hot is red, hurts, and swollen and itches..  History of Present Illness: Here for lesion L ankle/lower leg --noted 02/09/2009--now swollen and tender, itches --[place]--noted next morning --taking nothing, has done elevation and ice --worked on it x 12hrs yesterday, no work today  Allergies: 1)  ! Septra (Sulfamethoxazole-Trimethoprim) 2)  ! Lipitor (Atorvastatin Calcium)  Review of Systems      See HPI  Physical Exam  General:  alert, well-developed, well-nourished, and well-hydrated.   Skin:  area of increased redness centrally--skin intact with no pustular area, and lighter area of pink surrounding to just below ankle and lower 1/3 of anterolateral lower leg--slightly tender to touch and warm Psych:  normally interactive and good eye contact.     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 1 by mouth every day 2)  Nexium 40 Mg Cpdr (Esomeprazole magnesium) .... Take one by mouth two times a day 3)  Tylenol Extra Strength 500 Mg Tabs (Acetaminophen) .... Take one every 4 hours  as needed 4)  Vivelle-dot 0.075 Mg/24hr Pttw (Estradiol) .... 1/2 patch applied twice a week 5)  Vitamin D  .... Daily 6)  Calcium  .... Daily 7)  Flexeril 10 Mg Tabs (Cyclobenzaprine hcl) .... One tab by mouth three times a day as needed 8)  Aspirin 81 Mg Tabs (Aspirin) .... One daily 9)  Clarinex 5 Mg Tabs (Desloratadine) .Marland Kitchen.. 1 once daily as needed Prescriptions: FLEXERIL 10 MG TABS (CYCLOBENZAPRINE HCL) one tab by mouth three times a day as needed  #90 x 1   Entered and Authorized by:   Gildardo Griffes FNP   Signed by:   Gildardo Griffes FNP on 02/11/2009   Method used:   Electronically to        [name]* (retail)       [address]       [address] Shipping/mailing       [place], [place]  [address]       Ph: [phone]       Fax: [phone]   RxID:   [id number]   Current Allergies (reviewed today): ! SEPTRA (SULFAMETHOXAZOLE-TRIMETHOPRIM) ! LIPITOR (ATORVASTATIN CALCIUM)

## 2010-10-01 NOTE — Assessment & Plan Note (Signed)
Summary: cough,congestion/alc   Vital Signs:  Patient profile:   60 year old female Weight:      164.25 pounds Temp:     98.7 degrees F oral Pulse rate:   80 / minute Pulse rhythm:   regular BP sitting:   124 / 80  (left arm) Cuff size:   regular  Vitals Entered By: Selena Batten Dance CMA Duncan Dull) (August 27, 2010 9:33 AM) CC: Cough/Congestion x1 week   History of Present Illness: CC: cold  6d h/o cold sxs, RN, congestion, pain R maxillary region.  Actually better today.  4d h/o cough, productive of green sputum.  + ST, ear pain.  + "skin hurting".  cough waking her up at night.  Getting mucous out.  Has tried tylenol, delsym and pseudophed.  Also tried zycam, nyquil, dayquil.  No abd pain, f/c, n/v/d, rashes, myalgias, arthralgias.  No sick contacts around her.  No smokers around her.  No h/o asthma.  has had wheezing with bronchitis in past.  Pt nurse at [employer].  Current Medications (verified): 1)  Celexa 20 Mg  Tabs (Citalopram Hydrobromide) .Marland Kitchen.. 1 By Mouth Every Day 2)  Nexium 40 Mg  Cpdr (Esomeprazole Magnesium) .... Take One By Mouth Two Times A Day 3)  Tylenol Extra Strength 500 Mg  Tabs (Acetaminophen) .... Take One Every 4 Hours As Needed 4)  Vivelle-Dot 0.075 Mg/24hr Pttw (Estradiol) .... 1/2 Patch Applied Twice A Week 5)  Flexeril 10 Mg Tabs (Cyclobenzaprine Hcl) .... One Tab By Mouth Three Times A Day As Needed 6)  Aspirin 81 Mg  Tabs (Aspirin) .... One Daily 7)  Melatonin 10mg  .... Otc As Directed. 8)  Vitamin D-3 5000 Unit Tabs (Cholecalciferol) .... Take 1 Tablet By Mouth Once A Day 9)  Calcium Carbonate-Vitamin D 600-400 Mg-Unit  Tabs (Calcium Carbonate-Vitamin D) .... Take 1 Tablet By Mouth Once A Day 10)  Fish Oil .... One Pill By Mouth Once Daily 11)  Trazodone Hcl 50 Mg Tabs (Trazodone Hcl) .Marland Kitchen.. 1-1 1/2 By Mouth At Bedtime As Needed  Allergies: 1)  ! Septra 2)  ! Lipitor (Atorvastatin Calcium) 3)  ! 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  R max sinus tenderness Eyes:  vision grossly intact, pupils equal, pupils round, and pupils reactive to light.  no conjunctival pallor, injection or icterus  Ears:  R ear normal and L ear normal.   Nose:  no nasal discharge.  dry mucous membranes Mouth:  pharyngeal erythema no exudates.  mmm Neck:  supple with full rom and no masses or thyromegally, no JVD or carotid bruit  Lungs:  CTAB, crackles bibasilarly, clear with coughing. Heart:  Normal rate and regular rhythm. S1 and S2 normal without gallop, murmur, click, rub or other extra sounds. Pulses:  2+ rad pulses, brisk cap refill Skin:  Intact without suspicious lesions or rashes lentigos diffusely    Impression & Recommendations:  Problem # 1:  ACUTE BRONCHITIS (ICD-466.0) early.  supportive care.  rec tussionex.  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    Her  updated medication list for this problem includes:    Tussionex Pennkinetic Er 10-8 Mg/27ml Lqcr (Hydrocod polst-chlorphen polst) ..... One teaspoon two times a day as needed cough    Zithromax Z-pak 250 Mg Tabs (Azithromycin) ..... Use as directed  Complete Medication List: 1)  Celexa 20 Mg Tabs (Citalopram hydrobromide) .Marland Kitchen.. 1 by mouth every day 2)  Nexium 40 Mg Cpdr (Esomeprazole magnesium) .... Take one by mouth two times a day 3)  Tylenol Extra Strength 500 Mg Tabs (Acetaminophen) .... Take one every 4 hours as needed 4)  Vivelle-dot 0.075 Mg/24hr Pttw (Estradiol) .... 1/2 patch applied twice a week 5)  Flexeril 10 Mg Tabs  (Cyclobenzaprine hcl) .... One tab by mouth three times a day as needed 6)  Aspirin 81 Mg Tabs (Aspirin) .... One daily 7)  Melatonin 10mg   .... Otc as directed. 8)  Vitamin D-3 5000 Unit Tabs (Cholecalciferol) .... Take 1 tablet by mouth once a day 9)  Calcium Carbonate-vitamin D 600-400 Mg-unit Tabs (Calcium carbonate-vitamin d) .... Take 1 tablet by mouth once a day 10)  Fish Oil  .... One pill by mouth once daily 11)  Trazodone Hcl 50 Mg Tabs (Trazodone hcl) .Marland Kitchen.. 1-1 1/2 by mouth at bedtime as needed 12)  Tussionex Pennkinetic Er 10-8 Mg/50ml Lqcr (Hydrocod polst-chlorphen polst) .... One teaspoon two times a day as needed cough 13)  Zithromax Z-pak 250 Mg Tabs (Azithromycin) .... Use as directed  Patient Instructions: 1)  Sounds like you have a viral upper respiratory infection. 2)  Antibiotics are not needed for this.  Viral infections usually take 7-10 days to resolve.  The cough can last 4 weeks to go away. 3)  Use medication as prescribed: cheratussin and azithromycin to hold on to in case not noticeably better by Sunday. 4)  Please return if you are not improving as expected, or if you have high fevers (>101.5) or difficulty swallowing. 5)  Call clinic with questions.  Pleasure to see you today!  Prescriptions: ZITHROMAX Z-PAK 250 MG TABS (AZITHROMYCIN) use as directed  #1 x 0   Entered and Authorized by:   Eustaquio Boyden  MD   Signed by:   Eustaquio Boyden  MD on 08/27/2010   Method used:   Print then Give to Patient   RxID:   [id number] Sandria Senter ER 10-8 MG/5ML LQCR (HYDROCOD POLST-CHLORPHEN POLST) one teaspoon two times a day as needed cough  #100cc x 0   Entered and Authorized by:   Eustaquio Boyden  MD   Signed by:   Eustaquio Boyden  MD on 08/27/2010   Method used:   Print then Give to Patient   RxID:   [id number]    Orders Added: 1)  Est. Patient Level III [[address]]    Current Allergies (reviewed today): ! SEPTRA ! LIPITOR (ATORVASTATIN  CALCIUM) ! ZETIA (EZETIMIBE)

## 2010-10-01 NOTE — Progress Notes (Signed)
Summary: Flexeril refill  Phone Note Refill Request Message from:  [employer] on September 18, 2010 9:27 AM  Refills Requested: Medication #1:  FLEXERIL 10 MG TABS one tab by mouth three times a day as needed Electronic request for refill. Okay to do so?   Method Requested: Electronic Initial call taken by: Janee Morn CMA Duncan Dull),  September 18, 2010 9:27 AM  Follow-up for Phone Call        px written on EMR for call in  Follow-up by: Judith Part MD,  September 18, 2010 11:18 AM  Additional Follow-up for Phone Call Additional follow up Details #1::        Rx sent in as directed.  Additional Follow-up by: Janee Morn CMA Duncan Dull),  September 18, 2010 11:26 AM    Prescriptions: FLEXERIL 10 MG TABS (CYCLOBENZAPRINE HCL) one tab by mouth three times a day as needed  #90 x 0   Entered by:   Janee Morn CMA (AAMA)   Authorized by:   Judith Part MD   Signed by:   Selena Batten Dance CMA Duncan Dull) on 09/18/2010   Method used:   Electronically to        [facility]* (retail)       [address]       [address] [place] [address]       [place], [place]  [address]       Ph: [phone]       Fax: [phone]   RxID:   [id number]

## 2010-10-02 NOTE — Letter (Signed)
Summary: [facility] / MILD CARPAL TUNNEL L SIDE / DR. GARY K  [facility] / MILD CARPAL TUNNEL L SIDE / DR. GARY KUZMA   Imported By: Carin Primrose 09/20/2008 14:31:48  _____________________________________________________________________  External Attachment:    Type:   Image     Comment:   External Document

## 2010-10-05 ENCOUNTER — Encounter (HOSPITAL_BASED_OUTPATIENT_CLINIC_OR_DEPARTMENT_OTHER)
Admission: RE | Admit: 2010-10-05 | Discharge: 2010-10-05 | Disposition: A | Discharge status: Home / Self Care | Payer: 59 | Source: Ambulatory Visit | Attending: Plastic Surgery | Admitting: Plastic Surgery

## 2010-10-05 DIAGNOSIS — Z01810 Encounter for preprocedural cardiovascular examination: Secondary | ICD-10-CM | POA: Insufficient documentation

## 2010-10-08 ENCOUNTER — Ambulatory Visit (HOSPITAL_BASED_OUTPATIENT_CLINIC_OR_DEPARTMENT_OTHER)
Admission: RE | Admit: 2010-10-08 | Discharge: 2010-10-09 | Disposition: A | Discharge status: Home / Self Care | Payer: 59 | Source: Ambulatory Visit | Attending: Plastic Surgery | Admitting: Plastic Surgery

## 2010-10-08 ENCOUNTER — Other Ambulatory Visit: Payer: Self-pay | Admitting: Plastic Surgery

## 2010-10-08 DIAGNOSIS — Y849 Medical procedure, unspecified as the cause of abnormal reaction of the patient, or of later complication, without mention of misadventure at the time of the procedure: Secondary | ICD-10-CM | POA: Insufficient documentation

## 2010-10-08 DIAGNOSIS — T8549XA Other mechanical complication of breast prosthesis and implant, initial encounter: Secondary | ICD-10-CM | POA: Insufficient documentation

## 2010-10-08 DIAGNOSIS — Z9010 Acquired absence of unspecified breast and nipple: Secondary | ICD-10-CM | POA: Insufficient documentation

## 2010-10-08 DIAGNOSIS — T8544XA Capsular contracture of breast implant, initial encounter: Secondary | ICD-10-CM | POA: Insufficient documentation

## 2010-10-08 LAB — POCT HEMOGLOBIN-HEMACUE: Hemoglobin: 13.7 g/dL (ref 12.0–15.0)

## 2010-11-19 NOTE — Op Note (Signed)
NAMESABREA, [name]                 ACCOUNT NO.:  [id number]  MEDICAL RECORD NO.:  [id number]          PATIENT TYPE:  LOCATION:                                 FACILITY:  PHYSICIAN:  Etter Sjogren, M.D.          DATE OF BIRTH:  DATE OF PROCEDURE:  10/08/2010 DATE OF DISCHARGE:                              OPERATIVE REPORT   PREOPERATIVE DIAGNOSES: 1. Acquired absence of the breasts. 2. Severe capsular contracture bilateral, status post breast     reconstruction.  POSTOPERATIVE DIAGNOSES: 1. Acquired absence of the breasts. 2. Severe capsular contracture bilateral, status post breast     reconstruction. 3. Ruptured right gel implant.  PROCEDURES PERFORMED: 1. Removal of ruptured gel implant right and implant material. 2. Explantation gel implant, left. 3. Bilateral total capsulectomy. 4. Bilateral immediate breast reconstruction with a saline implant.  SURGEON:  Etter Sjogren, MD  ANESTHESIA:  General.  ESTIMATED BLOOD LOSS:  30 mL.  DRAINS:  One 19-French is left in the side.  CLINICAL NOTE:  This 60 year old woman has had bilateral subcutaneous mastectomy by another plastic surgeon approximately 22 years ago and had reconstruction with tissue expanders and then later placed on silicone gel implants.  She developed severe capsular contractures and these have been very in comfortable for her and painful and deforming.  She desired a explantation and reconstruction with saline implants.  Initially, the procedure and risks and possible complications were discussed with her in great detail and she understood those risks and wished to proceed. The risks include not limited to bleeding, infection, anesthesia complications, healing problems, scarring, loss of sensation, fluid collections, failure to heal well, displacement of the implants, capsular contracture, failure of implants, wrinkles, ripples, disappointment, secondary surgeries have recurrence of  capsular contracture, pulmonary embolism, pneumothorax and overall disappointment and she understood all this and wished to proceed.  DESCRIPTION OF PROCEDURE:  The patient was marked in the holding area for the desired inframammary fold positioning of the implants.  She was then taken to the operative room, placed supine.  After successful induction of general anesthesia, she was prepped with ChloraPrep and after waiting for 3 minutes for the drying time, she was draped with sterile drapes.  The old incisions were used and went in for areolar and then lateral to the areola and dissection carried down through the subcutaneous tissue and the underlying capsules were identified.  These were opened and these implants were submuscular.  On the right side, the implant was found to be ruptured and was removed and the pocket cleaned of silicone gel.  In the left side, the implant was found to be intact. Total capsulectomies were performed taking great care to avoid damage to underlying chest cavity and total capsulectomies had been performed. The wounds were irrigated with saline and meticulous hemostasis with electrocautery.  The spaces were opened appropriately inferiorly in order to achieve good positioning of the implants.  The 0 PDS blocking sutures were then placed one on each side at the inframammary fold just slightly below and these were placed through the underlying chest wall and then  back out through the skin to be tied later over Xeroform bolster dressing bolster.  Again, excellent hemostasis was confirmed after thoroughly cleaning gloves, the implants were prepared.  These were Mentor smooth round saline high profile implants of 600 mL implant was planned on the right side and a probably a 675 mL on the left.  The right side 600 mL implant was placed first.  It was soaked in antibiotic solution.  It was prepared.  All the air removed, 100 mL sterile saline placed using a closed  filling system.  The implant was then positioned and then filled to the maximum 600 mL and seemed to be in very good position and care was taken to make sure that the fill port was located directly behind the nipple-areolar complex antibiotic solution again placed in the space as it had been placed prior to position of the implant.  In addition, 19-French drain had also been positioned and brought through the separate stab wound inferolaterally and had been secured with a 3-0 Prolene suture as well.  With the implant in position, again antibiotic solution was placed over it and the closure with 3-0 Vicryl interrupted figure-of-eight sutures and after the wound was irrigated with antibiotic solution and then 3-0 Monocryl interrupted inverted deep dermal sutures and running 4-0 Monocryl subcuticular suture.  Attention was then directed to the left side where again through irrigation with saline antibiotic solution again placed in the space and allowed to dwell there as it had been done on the right side and the sizer was positioned and filled up to 750 and then backed off to 675. The patient was placed in sitting position.  It was felt that the 675 mL fill gave her good symmetry with the opposite side.  Her previous reconstruction had been performed with 460 and 500 mL silicone gel implants and she felt that they have given her fairly good symmetry of the left side, but the 500 had been little bit small and she did want to be a little bit larger at this time.  Having determined the 675 mL implant would be used, the sizer was removed.  The irrigation again with antibiotic solution meticulously with electrocautery.  The blocking suture was placed as previous with a 0 PDS and that was passed through the skin through almost to the chest wall just below the level of the planned inframammary crease and then back out the skin.  After thoroughly cleaning gloves, this implant was prepared.  This  was 675 mL Mentor smooth round high profile saline implant, soaked in antibiotic solution 100 mL sterile saline placed as a closed filling system and again the 3-0 Vicryl sutures were preplaced as figure-of-eight closures of the muscle layer and left untied.  The implant was positioned.  It was filled to the maximum 675 mL.  Again, she was placed in the same position, found to have good symmetry in size and position, returned supine.  Irrigation with antibiotic solution.  The 3-0 Vicryl sutures were then tied securely and the wound irrigated with antibiotic solution and then closed with 3-0 Monocryl interrupted inverted deep dermal sutures and running 4-0 Monocryl subcuticular suture.  Steri-Strips and a dry sterile dressing applied with the drain dressings.  There was one drain that was then placed on each side and these were dressed with antibiotic ointment and dry sterile dressing.  The circumferential Ace wrap placed over to hold the implants in the inferior position and another Ace wrap placed as well  in the chest binder and she was transported to the recovery room in stable having tolerated the procedure well.  DISPOSITION:  She will be observed in the RCC overnight.     Etter Sjogren, M.D.     DB/MEDQ  D:  10/08/2010  T:  10/09/2010  Job:  [id number]  Electronically Signed by Etter Sjogren M.D. on 11/19/2010 05:04:28 PM

## 2011-01-12 NOTE — Op Note (Signed)
[name], [name]                ACCOUNT NO.:  [id number]   MEDICAL RECORD NO.:  [id number]          PATIENT TYPE:  AMB   LOCATION:  DSC                          FACILITY:  [facility]   PHYSICIAN:  Cindee Salt, M.D.       DATE OF BIRTH:  1951-03-19   DATE OF PROCEDURE:  09/26/2008  DATE OF DISCHARGE:                               OPERATIVE REPORT   PREOPERATIVE DIAGNOSIS:  Carpal tunnel syndrome, left hand.   POSTOPERATIVE DIAGNOSIS:  Carpal tunnel syndrome, left hand.   OPERATION:  Decompression, left median nerve.   SURGEON:  Cindee Salt, MD   ASSISTANT:  Carolyne Fiscal, RN   ANESTHESIA:  Forearm-based IV regional.   ANESTHESIOLOGIST:  Kaylyn Layer. Michelle Piper, MD   HISTORY:  The patient is a 60 year old female with a history of carpal  tunnel syndrome, EMG nerve conductions positive.  She has undergone  release of her right side.  She is admitted now for release of her left.  She is aware of risks and complications including infection, recurrence  injury to arteries, nerves, and tendons, incomplete relief of symptoms,  and dystrophy.  In the preoperative area, the patient is seen.  The  extremity was marked by both the patient and surgeon.  Antibiotic is  given.   PROCEDURE:  The patient was brought to the operating room where a  forearm-based IV regional anesthetic was carried out without difficulty.  She was prepped using DuraPrep, supine position with the left arm free.  A time-out was taken confirming the patient and procedure.  A  longitudinal incision was made in the palm and carried down through the  subcutaneous tissue.  Bleeders were electrocauterized.  The palmar  fascia was split.  Superficial palmar arch was identified.  The flexor  tendon to the ring/little finger identified to the ulnar side of the  median nerve.  The carpal retinaculum was incised with sharp dissection.  A right-angle and Sewall retractor were placed between the skin and  forearm fascia.  Fascia was released for  approximately a centimeter and  half proximal to the wrist crease under direct vision.  The canal was  explored.  No further lesions were identified.  The area of compression  to the nerve was apparent.  The wound was copiously irrigated with  saline.  The skin was then closed with interrupted 5-0  Vicryl Rapide sutures.  A sterile compressive dressing and splint to the  wrist was applied with the fingers free.  The patient tolerated the  procedure well and was taken to the recovery room for observation in  satisfactory condition.  She will be discharged home to return to the  [facility] in 1 week on Vicodin.           ______________________________  Cindee Salt, M.D.     GK/MEDQ  D:  09/26/2008  T:  09/26/2008  Job:  [id number]   cc:   Marne A. Milinda Antis, MD

## 2011-01-12 NOTE — Op Note (Signed)
[name], [name]                ACCOUNT NO.:  [id number]   MEDICAL RECORD NO.:  [id number]          PATIENT TYPE:  AMB   LOCATION:  ENDO                         FACILITY:  [facility]   PHYSICIAN:  Anselmo Rod, M.D.  DATE OF BIRTH:  Jan 05, 1951   DATE OF PROCEDURE:  01/06/2007  DATE OF DISCHARGE:                               OPERATIVE REPORT   PROCEDURE PERFORMED:  Esophagogastroduodenoscopy with multiple cold  biopsies.   ENDOSCOPIST:  Anselmo Rod, M.D.   INSTRUMENT USED:  Pentax video panendoscope.   INDICATIONS FOR PROCEDURE:  60 year old white female with a history of  abdominal pain, especially right upper quadrant and epigastric  discomfort with intermittent nausea and vomiting, undergoing EGD to rule  out peptic ulcer disease, esophagitis, gastritis, etc.   PREPROCEDURE PREPARATION:  Informed consent was procured from the  patient.  The patient was fasted for 4 hours prior to the procedure.  The risks and benefits of the procedure were discussed with the patient  in great detail.   PREPROCEDURE PHYSICAL:  Patient had stable vital signs.  Neck supple.  Chest clear to auscultation.  S1 and S2 regular.  Abdomen soft with  normal bowel.   DESCRIPTION OF PROCEDURE:  The patient was placed in the left lateral  decubitus position and sedated with 50 mcg of Fentanyl and 6.5 mg of  Versed given intravenously in slow incremental doses.  Once the patient  was adequately sedated and maintained on low flow oxygen and continuous  cardiac monitoring, the Pentax video panendoscope was advanced through  the mouthpiece over the tongue into the esophagus under direct vision.  The entire esophagus was widely patent with no evidence of ring,  stricture, mass, esophagitis or Barrett's mucosa.  The scope was then  advanced into the stomach.  Antral erythema was noted.  Small sessile  polyps in the proximal stomach which were biopsied for pathology.  Antral biopsies were done to rule out  presence of H. pylori by  pathology, as well.  The proximal small bowel appeared normal.  There  was no outlet obstruction.  The patient tolerated the procedure well  without immediate complications.   IMPRESSION:  1. Widely patent esophagus with no evidence of ring, stricture,      esophagitis or Barrett's mucosa.  2. A few small sessile polyps in the proximal stomach, biopsied for      pathology.  3. Antral erythema, biopsies done for H. pylori.  4. Normal proximal small bowel.   RECOMMENDATIONS:  1. Await pathology results.  2. Continue PPI.  3. Avoid all nonsteroidals for now.  4. Outpatient follow-up in the next two weeks for further      recommendations.      Anselmo Rod, M.D.  Electronically Signed     JNM/MEDQ  D:  01/07/2007  T:  01/07/2007  Job:  [id number]   cc:   Marne A. Milinda Antis, MD

## 2011-01-15 NOTE — Cardiovascular Report (Signed)
[name], [name]                ACCOUNT NO.:  [id number]   MEDICAL RECORD NO.:  [id number]          PATIENT TYPE:  OIB   LOCATION:  2899                         FACILITY:  [facility]   PHYSICIAN:  Colleen Can. Deborah Chalk, M.D.DATE OF BIRTH:  Dec 21, 1950   DATE OF PROCEDURE:  01/21/2006  DATE OF DISCHARGE:                              CARDIAC CATHETERIZATION   INDICATION FOR PROCEDURE:  Left bundle branch block, chest pain, equivocal  stress Cardiolite study.   PROCEDURE:  1.  Left heart catheterization with selective coronary angiography.  2.  Left ventricular angiography.   TYPE AND SITE OF ENTRY:  Percutaneous right femoral artery with AngioSeal.   CATHETERS:  A 6-French 4 curved Judkins right and left coronary catheter, 6-  French pigtail ventriculographic catheter.   CONTRAST MATERIAL:  Omnipaque.   MEDICATIONS GIVEN PRIOR TO PROCEDURE.:  Valium 10 mg.   MEDICATIONS GIVEN DURING THE PROCEDURE:  Versed 4 mg IV.   COMMENTS:  The patient tolerated the procedure well.   HEMODYNAMIC DATA:  Aortic pressure 136/77.  LV was 154/11-13.  There was no  aortic valve gradient noted on pullback.   ANGIOGRAPHIC DATA:  1.  Left main coronary artery is normal.  2.  Left anterior descending extended to the apex.  There is some tortuosity      toward the apex but there is no focal obstructive disease.  The left      anterior descending was normal.  3.  Left circumflex:  Left circumflex is a moderately large vessel.  It is      normal.  4.  Right coronary artery:  The right coronary artery is normal.  It is a      dominant vessel.   Left ventricular angiograms is performed in the RAO position.  Overall  cardiac size and silhouette are normal.  Global ejection fraction is 60%.  Regional wall motion is normal.   OVERALL IMPRESSION:  1.  Normal left ventricular function.  2.  Normal coronary arteries.      Colleen Can. Deborah Chalk, M.D.  Electronically Signed     SNT/MEDQ  D:  01/21/2006   T:  01/21/2006  Job:  [id number]

## 2011-01-15 NOTE — H&P (Signed)
[name], [name]                ACCOUNT NO.:  [id number]   MEDICAL RECORD NO.:  [id number]           PATIENT TYPE:   LOCATION:                                 FACILITY:   PHYSICIAN:  Colleen Can. Deborah Chalk, M.D.DATE OF BIRTH:  06-12-1951   DATE OF ADMISSION:  DATE OF DISCHARGE:                                HISTORY & PHYSICAL   CHIEF COMPLAINT:  Chest pain.   HISTORY OF PRESENT ILLNESS:  The patient is a very pleasant 60 year old  female who has a known history of hyperlipidemia and a chronic left bundle-  branch block which dates back at least 5 years.  She presented to our office  with chest pain and had a non-diagnostic adenosine Cardiolite study  performed on Jan 19, 2006.  She is now referred for catheterization.   Two weeks ago she had a crampy discomfort that radiated into the jaw as well  as to her arm.  It occurred originally after she had eaten when she had a  sudden onset of left-sided chest pain.  She was quite diaphoretic.  She had  no nausea or vomiting.  She was not short of breath.  She had only taken one  dose of Nexium that day and took additional Nexium and slept for  approximately one-and-a-half hours.  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  She has basically had two episodes of chest pain in the setting of  a known left bundle-branch block.  Cardiolite testing was performed on Jan 19, 2006, which was non-diagnostic.  The patient has had previous breast  implants.  In light of her symptomatology and the non-diagnostic Cardiolite,  she is referred for catheterization.   PAST MEDICAL HISTORY:  1.  Hysterectomy.  2.  Cholecystectomy approximately 8 years ago.  3.  She has had bilateral mastectomies for fibrocystic disease with      implants.  She has had previous breast biopsies.  4.  Gastroesophageal  reflux disease.  5.  Anxiety and depression.  6.  Recently-diagnosed hyperlipidemia.  7.  Excess weight.   ALLERGIES:  SEPTRA which causes a rash.   CURRENT MEDICINES:  Include Nexium and Lexapro.   FAMILY HISTORY:  Her father died at age 6 with leukemia and had no cardiac  complaints.  Her mother is alive at age 21 and has question of an old silent  myocardial infarction, and has had previous episodes of chest pain and heart  trouble.   SOCIAL HISTORY:  She is married.  She has had no smoking and only social  alcohol use.   REVIEW OF SYSTEMS:  Is as noted above and is otherwise unremarkable.   PHYSICAL EXAMINATION:  GENERAL:  She is a very pleasant middle-aged white  female in no acute distress.  VITAL SIGNS:  Blood pressure was 140/70, heart rate in the 70s, respiratory  rate 18, she is afebrile.  SKIN:  Warm and dry, color is unremarkable.  LUNGS:  Basically clear.  HEART:  Shows  regular rhythm.  ABDOMEN:  Soft.  EXTREMITIES:  Without edema.  NEUROLOGIC:  Intact.  There are no gross focal deficits.   PERTINENT LABORATORIES:  Pending.   OVERALL IMPRESSION:  1.  Chest pain.  2.  Non-diagnostic Cardiolite study.  3.  Left bundle-branch block.  4.  Hyperlipidemia, recently diagnosed.  5.  Gastroesophageal reflux disease.   PLAN:  Will proceed on with cardiac catheterization.  The procedure has been  reviewed in full detail and she is willing to proceed on Friday, Jan 21, 2006.      Sharlee Blew, N.P.      Colleen Can. Deborah Chalk, M.D.  Electronically Signed    LC/MEDQ  D:  01/19/2006  T:  01/19/2006  Job:  [id number]   cc:   Marne A. Milinda Antis, M.D. [facility]  [address], [place]  [place] [address]   Gretta Cool, M.D.  Fax: [phone]

## 2011-01-15 NOTE — Op Note (Signed)
[name], [name]                            ACCOUNT NO.:  [id number]   MEDICAL RECORD NO.:  [id number]                   PATIENT TYPE:  AMB   LOCATION:  ENDO                                 FACILITY:  [facility]   PHYSICIAN:  Anselmo Rod, M.D.               DATE OF BIRTH:  [birth year]   DATE OF PROCEDURE:  10/03/2002  DATE OF DISCHARGE:                                 OPERATIVE REPORT   PROCEDURE PERFORMED:  Colonoscopy with biopsy x2.   ENDOSCOPIST:  Anselmo Rod, M.D.   INSTRUMENT USED:  Olympus video colonoscope (intractable pediatric scope).   INDICATIONS FOR THE PROCEDURE:  A 60 year old white female undergoing  screening colonoscopy.  The patient has a family history of colonic polyps,  a personal history of rectal bleeding, and had an EGD earlier today that  revealed no acute abnormalities.  Rule out colonic polyps, masses, etc.   PROCEDURE PREPARATION:  Informed consent was procured from the patient.  The  patient had fasted for eight hours prior to the procedure and prepped with a  bottle of MiraLax and Gatorade the night prior to the procedure.   BRIEF HISTORY AND PHYSICAL:  VITAL SIGNS:  Stable.  NECK:  Supple.  CHEST:  Clear to auscultation.  S1 and S2 regular.  ABDOMEN:  Soft with normal bowel sounds.   DESCRIPTION OF THE PROCEDURE:  The patient was placed in the left lateral  decubitus position and sedated with an additional 20 mg of Demerol and 2 mg  of Versed intravenously.  Once the patient was adequately sedated and  maintained on low-flow oxygen and continuous cardiac monitoring, the Olympus  video colonoscope was advanced from the rectum to the cecum.  The  appendiceal orifice and the ileocecal valve were visualized and  photographed.  No masses, polyps, erosions, ulcerations, or diverticula were  seen.  A small sessile polyp was biopsied at 20 cm.  Retroflexion revealed  no acute abnormalities except for small, nonbleeding internal hemorrhoids.   IMPRESSION:  1. Small, nonbleeding internal hemorrhoids.  2. Small sessile polyp snared at 20 cm.  3. Normal-appearing proximal left colon, transverse colon, right colon, and     cecum.   RECOMMENDATIONS:  1. Await pathology results.  2.     Repeat CRC screening depending on pathology results in the next 3-5 years.  3. _________.  4. Outpatient followup in the next two weeks or earlier if need be.                                               Anselmo Rod, M.D.    JNM/MEDQ  D:  10/03/2002  T:  10/03/2002  Job:  [id number]   cc:   Marne A. Tower, M.D. [facility]  [address]  [place]  [place] [address]  Fax: [phone]

## 2011-01-15 NOTE — Op Note (Signed)
   NAMECARLEIGH, [name]                            ACCOUNT NO.:  [id number]   MEDICAL RECORD NO.:  [id number]                   PATIENT TYPE:  AMB   LOCATION:  ENDO                                 FACILITY:  [facility]   PHYSICIAN:  Anselmo Rod, M.D.               DATE OF BIRTH:  [birth year]   DATE OF PROCEDURE:  10/03/2002  DATE OF DISCHARGE:                                 OPERATIVE REPORT   PROCEDURE PERFORMED:  Esophagogastroduodenoscopy.   ENDOSCOPIST:  Anselmo Rod, M.D.   INSTRUMENT USED:  Olympus video panendoscope.   INDICATIONS FOR PROCEDURE:  A 60 year old white female with epigastric pain,  rectal bleeding, rule out peptic ulcer disease, esophagitis, gastritis, etc.   PREPROCEDURE PREPARATION:  Informed consent was procured from the patient.  The patient was fasted for eight hours prior to the procedure.   PREPROCEDURE PHYSICAL:  VITAL SIGNS: The patient had stable vital signs.  NECK: Supple.  CHEST: Clear to auscultation.  S1 and S2 regular.  ABDOMEN: Soft with normal bowel sounds.   DESCRIPTION OF PROCEDURE:  The patient was placed in the left lateral  decubitus position, sedated with 60 mg of Demerol and 6 mg of Versed  intravenously.  Once the patient was adequately sedated and maintained on  low flow oxygen and continuous cardiac monitoring, the Olympus video  panendoscope was advanced through the mouth piece, over the tongue, into the  esophagus and under direct vision.  The entire esophagus appeared normal  with no evidence of rings, strictures, masses or esophagitis or Barrett's  mucosa.  The scope was then advanced into the stomach.  The entire gastric  mucosa and the proximal and small bowel appeared normal.  Retroflexion  revealed no acute abnormalities.  The patient tolerated the procedure well  without complications.   IMPRESSION:  Normal EGD.  No ulcers, erosions, masses or polyps seen.    RECOMMENDATIONS:  1. Continue proton pump inhibitors.  2.  Proceed with a colonoscopy at this time.  3. Follow antireflux measures and avoid all nonsteroidals including aspirin.                                               Anselmo Rod, M.D.    JNM/MEDQ  D:  10/03/2002  T:  10/03/2002  Job:  [id number]   cc:   Marne A. Tower, M.D. [facility]  [address] [place]  [place] [address]  Fax: 1

## 2011-03-17 ENCOUNTER — Other Ambulatory Visit: Payer: Self-pay | Admitting: Family Medicine

## 2011-03-17 NOTE — Telephone Encounter (Signed)
Px sent electronically  

## 2011-03-17 NOTE — Telephone Encounter (Signed)
[name] outpatient pharmacy electronically request refill for Nexium 40 mg #180 x1 given with note for pt to call for appt. Celexa request sent to Dr Milinda Antis as instructed.

## 2011-06-04 ENCOUNTER — Other Ambulatory Visit: Payer: Self-pay | Admitting: Gynecology

## 2011-06-07 ENCOUNTER — Ambulatory Visit (INDEPENDENT_AMBULATORY_CARE_PROVIDER_SITE_OTHER): Payer: 59 | Admitting: Family Medicine

## 2011-06-07 ENCOUNTER — Encounter: Payer: Self-pay | Admitting: Family Medicine

## 2011-06-07 VITALS — BP 130/90 | HR 90 | Temp 100.3°F | Ht 63.5 in | Wt 160.0 lb

## 2011-06-07 DIAGNOSIS — J069 Acute upper respiratory infection, unspecified: Secondary | ICD-10-CM

## 2011-06-07 LAB — HM PAP SMEAR

## 2011-06-07 LAB — HM COLONOSCOPY

## 2011-06-07 MED ORDER — CHLORPHENIRAMINE-HYDROCODONE 8-10 MG/5ML PO LQCR: 5.0000 mL | Freq: Two times a day (BID) | ORAL | Status: AC | PRN

## 2011-06-07 MED ORDER — AZITHROMYCIN 250 MG PO TABS: ORAL_TABLET | ORAL | Status: AC

## 2011-06-07 NOTE — Progress Notes (Signed)
SUBJECTIVE:  [name] is a 60 y.o. female who complains of coryza, congestion, sore throat, swollen glands, dry cough and fever for 8 days. She denies a history of anorexia, chest pain, chills, nausea, shortness of breath and vomiting and denies a history of asthma.    Patient Active Problem List  Diagnoses  . NEOPLASM OF UNCERTAIN BEHAVIOR OF SKIN  . DEPRESSION  . GERD  . ABDOMINAL PAIN, LEFT UPPER QUADRANT  . TRANSAMINASES, SERUM, ELEVATED  . INSECT BITE, LEG   Past Medical History  Diagnosis Date  . Depression   . GERD (gastroesophageal reflux disease)   . Fatty liver   . Sphincter of Oddi dysfunction    Past Surgical History  Procedure Date  . Mastectomy     bilateral for severe macrocystic breast  . Carpal tunnel release 08/2008    left  . Ercp    History  Substance Use Topics  . Smoking status: Never Smoker   . Smokeless tobacco: Not on file  . Alcohol Use: Yes   Family History  Problem Relation Age of Onset  . Hypertension Father   . Cancer Maternal Aunt     Pancreatic  . Diabetes Maternal Uncle   . Heart disease Maternal Grandmother    Allergies  Allergen Reactions  . Atorvastatin     REACTION: rash  . Ezetimibe     REACTION: muscle aches  . Sulfamethoxazole W/Trimethoprim    Current Outpatient Prescriptions on File Prior to Visit  Medication Sig Dispense Refill  . citalopram (CELEXA) 20 MG tablet TAKE 1 TABLET BY MOUTH DAILY  90 tablet  0  . NEXIUM 40 MG capsule TAKE 1 CAPSULE BY MOUTH TWICE DAILY  180 capsule  1   The PMH, PSH, Social History, Family History, Medications, and allergies have been reviewed in [facility], and have been updated if relevant.  OBJECTIVE: BP 130/90  Pulse 90  Temp(Src) 100.3 F (37.9 C) (Oral)  Ht 5' 3.5" (1.613 m)  Wt 160 lb (72.576 kg)  BMI 27.90 kg/m2  She appears well, vital signs are as noted. Ears normal.  Throat and pharynx normal.  Neck supple. No adenopathy in the neck. Nose is congested. Sinuses non tender.  The chest is clear, without wheezes or rales.  ASSESSMENT:  viral upper respiratory illness  PLAN: Symptomatic therapy suggested: push fluids, rest and return office visit prn if symptoms persist or worsen. Lack of antibiotic effectiveness discussed with her. Given rx for Zpack to fill if symptoms worsen.  Call or return to clinic prn if these symptoms worsen or fail to improve as anticipated.

## 2011-06-07 NOTE — Patient Instructions (Signed)
Hope you feel better soon! Take antibiotic as directed.  Drink lots of fluids.  Treat sympotmatically with Mucinexand Tylenol/Ibuprofen.Cough suppressant at night. Call if not improving as expected in 5-7 days.

## 2011-06-22 ENCOUNTER — Other Ambulatory Visit: Payer: Self-pay | Admitting: Family Medicine

## 2011-06-22 NOTE — Telephone Encounter (Signed)
Will refill electronically  

## 2011-06-22 NOTE — Telephone Encounter (Signed)
[facility] outpatient pharmacy request refill on Celexa 20 mg last filled 03/17/11 and Flexeril 10 mg. Last filled 09/18/10.

## 2011-09-23 ENCOUNTER — Other Ambulatory Visit: Payer: Self-pay | Admitting: Family Medicine

## 2012-02-20 ENCOUNTER — Telehealth: Payer: Self-pay | Admitting: Family Medicine

## 2012-02-20 DIAGNOSIS — Z0000 Encounter for general adult medical examination without abnormal findings: Secondary | ICD-10-CM

## 2012-02-20 NOTE — Telephone Encounter (Signed)
Message copied by Judy Pimple on Sun Feb 20, 2012  4:39 PM ------      Message from: Baldomero Lamy      Created: Thu Feb 17, 2012  8:18 AM      Regarding: Cpx labs Tues 6/25       Please order  future cpx labs for pt's upcomming lab appt.      Thanks      Rodney Booze

## 2012-02-24 ENCOUNTER — Other Ambulatory Visit (INDEPENDENT_AMBULATORY_CARE_PROVIDER_SITE_OTHER): Payer: 59

## 2012-02-24 DIAGNOSIS — Z0000 Encounter for general adult medical examination without abnormal findings: Secondary | ICD-10-CM

## 2012-02-24 LAB — CBC WITH DIFFERENTIAL/PLATELET
Basophils Absolute: 0 10*3/uL (ref 0.0–0.1)
Basophils Relative: 0.8 % (ref 0.0–3.0)
Eosinophils Absolute: 0.1 10*3/uL (ref 0.0–0.7)
Eosinophils Relative: 2.8 % (ref 0.0–5.0)
HCT: 37.3 % (ref 36.0–46.0)
Hemoglobin: 12.1 g/dL (ref 12.0–15.0)
Lymphocytes Relative: 40.4 % (ref 12.0–46.0)
Lymphs Abs: 2 10*3/uL (ref 0.7–4.0)
MCHC: 32.6 g/dL (ref 30.0–36.0)
MCV: 87.4 fl (ref 78.0–100.0)
Monocytes Absolute: 0.4 10*3/uL (ref 0.1–1.0)
Monocytes Relative: 8.7 % (ref 3.0–12.0)
Neutro Abs: 2.4 10*3/uL (ref 1.4–7.7)
Neutrophils Relative %: 47.3 % (ref 43.0–77.0)
Platelets: 263 10*3/uL (ref 150.0–400.0)
RBC: 4.27 Mil/uL (ref 3.87–5.11)
RDW: 13.6 % (ref 11.5–14.6)
WBC: 5 10*3/uL (ref 4.5–10.5)

## 2012-02-24 LAB — LIPID PANEL
Cholesterol: 206 mg/dL — ABNORMAL HIGH (ref 0–200)
HDL: 60.1 mg/dL (ref >39.00)
Total CHOL/HDL Ratio: 3
Triglycerides: 159 mg/dL — ABNORMAL HIGH (ref 0.0–149.0)
VLDL: 31.8 mg/dL (ref 0.0–40.0)

## 2012-02-24 LAB — COMPREHENSIVE METABOLIC PANEL
ALT: 19 U/L (ref 0–35)
AST: 17 U/L (ref 0–37)
Albumin: 4 g/dL (ref 3.5–5.2)
Alkaline Phosphatase: 44 U/L (ref 39–117)
BUN: 14 mg/dL (ref 6–23)
CO2: 29 mEq/L (ref 19–32)
Calcium: 8.9 mg/dL (ref 8.4–10.5)
Chloride: 106 mEq/L (ref 96–112)
Creatinine, Ser: 0.5 mg/dL (ref 0.4–1.2)
GFR: 124.05 mL/min (ref >60.00)
Glucose, Bld: 99 mg/dL (ref 70–99)
Potassium: 4.2 mEq/L (ref 3.5–5.1)
Sodium: 140 mEq/L (ref 135–145)
Total Bilirubin: 1 mg/dL (ref 0.3–1.2)
Total Protein: 7 g/dL (ref 6.0–8.3)

## 2012-02-24 LAB — TSH: TSH: 1.34 u[IU]/mL (ref 0.35–5.50)

## 2012-02-24 LAB — LDL CHOLESTEROL, DIRECT: Direct LDL: 117.3 mg/dL

## 2012-02-29 ENCOUNTER — Ambulatory Visit (INDEPENDENT_AMBULATORY_CARE_PROVIDER_SITE_OTHER): Payer: 59 | Admitting: Family Medicine

## 2012-02-29 ENCOUNTER — Encounter: Payer: Self-pay | Admitting: Family Medicine

## 2012-02-29 VITALS — BP 118/64 | HR 109 | Temp 98.2°F | Ht 64.5 in | Wt 163.2 lb

## 2012-02-29 DIAGNOSIS — Z0000 Encounter for general adult medical examination without abnormal findings: Secondary | ICD-10-CM

## 2012-02-29 MED ORDER — CITALOPRAM HYDROBROMIDE 20 MG PO TABS: 20.0000 mg | ORAL_TABLET | Freq: Every day | ORAL | Status: DC

## 2012-02-29 MED ORDER — CYCLOBENZAPRINE HCL 10 MG PO TABS: 10.0000 mg | ORAL_TABLET | Freq: Every day | ORAL | Status: DC | PRN

## 2012-02-29 MED ORDER — ESOMEPRAZOLE MAGNESIUM 40 MG PO CPDR: 40.0000 mg | DELAYED_RELEASE_CAPSULE | Freq: Two times a day (BID) | ORAL | Status: DC

## 2012-02-29 NOTE — Patient Instructions (Addendum)
If you are interested in a shingles/zoster vaccine - call your insurance to check on coverage,( you should not get it within 1 month of other vaccines) , then call us for a prescription  for it to take to a pharmacy that gives the shot  Keep up the good health habits  Try to aim for 5 days of exercise per week  Labs look stable - keep working on the low cholesterol diet

## 2012-02-29 NOTE — Assessment & Plan Note (Signed)
Reviewed health habits including diet and exercise and skin cancer prevention Also reviewed health mt list, fam hx and immunizations  Reviewed wellness labs in detail  LDL chol down a bit -disc low sat fat diet  Adv to aim for exercise 5 d per week

## 2012-02-29 NOTE — Progress Notes (Signed)
Subjective:    Patient ID: [name], female    DOB: 06-07-1951, 61 y.o.   MRN: [id number]  HPI Here for health maintenance exam and to review chronic medical problems   Is doing ok overall  Gets tired more easily   trazadone is helping sleep - helps a lot  Gets that from Dr Nicholas Lose   bp is good 118/64 Wt is up 3 lb with bmi of 27 Is eating a healthy diet for the most part  Works a lot of yard work -- for her exercise and also working on her feet  Is RN at [employer] Value Date/Time   NA 140 02/24/2012 0833   K 4.2 02/24/2012 0833   CL 106 02/24/2012 0833   CO2 29 02/24/2012 0833   BUN 14 02/24/2012 0833   CREATININE 0.5 02/24/2012 0833      Component Value Date/Time   CALCIUM 8.9 02/24/2012 0833   ALKPHOS 44 02/24/2012 0833   AST 17 02/24/2012 0833   ALT 19 02/24/2012 0833   BILITOT 1.0 02/24/2012 0833      Lab Results  Component Value Date   TSH 1.34 02/24/2012    Lab Results  Component Value Date   WBC 5.0 02/24/2012   HGB 12.1 02/24/2012   HCT 37.3 02/24/2012   MCV 87.4 02/24/2012   PLT 263.0 02/24/2012    Lab Results  Component Value Date   CHOL 206* 02/24/2012   CHOL 205* 03/12/2010   CHOL 204* 06/20/2008   Lab Results  Component Value Date   HDL 60.10 02/24/2012   HDL 19.14 03/12/2010   HDL 78.2 06/20/2008   Lab Results  Component Value Date   LDLCALC 73 09/23/2006   Lab Results  Component Value Date   TRIG 159.0* 02/24/2012   TRIG 158.0* 03/12/2010   TRIG 145 06/20/2008   Lab Results  Component Value Date   CHOLHDL 3 02/24/2012   CHOLHDL 3 03/12/2010   CHOLHDL 3.4 CALC 06/20/2008   Lab Results  Component Value Date   LDLDIRECT 117.3 02/24/2012   LDLDIRECT 124.5 03/12/2010   LDLDIRECT 106.1 06/20/2008   is overall a bit improved    Does not get mammograms due to bilateral mastectomy in the past  However- is getting one in the am -- due to problems with a ruptured implant (still has 10% breast tissue)    Zoster status- has  not had a shingles shot  Will look into insurance coverage   Last pap 10/12 - everything was ok   Colonoscopy--was in 2009- 10 year recall   On vivelle dot- HRT - is doing well with that     On celexa for depresion - doing well with that   nexium for GERD- doing well with that   Patient Active Problem List  Diagnosis  . NEOPLASM OF UNCERTAIN BEHAVIOR OF SKIN  . DEPRESSION  . GERD  . ABDOMINAL PAIN, LEFT UPPER QUADRANT  . TRANSAMINASES, SERUM, ELEVATED  . INSECT BITE, LEG  . Routine general medical examination at a health care facility   Past Medical History  Diagnosis Date  . Depression   . GERD (gastroesophageal reflux disease)   . Fatty liver   . Sphincter of Oddi dysfunction    Past Surgical History  Procedure Date  . Mastectomy     bilateral for severe macrocystic breast  . Carpal tunnel release 08/2008    left  . Ercp  History  Substance Use Topics  . Smoking status: Never Smoker   . Smokeless tobacco: Not on file  . Alcohol Use: Yes   Family History  Problem Relation Age of Onset  . Hypertension Father   . Cancer Maternal Aunt     Pancreatic  . Diabetes Maternal Uncle   . Heart disease Maternal Grandmother    Allergies  Allergen Reactions  . Atorvastatin     REACTION: rash  . Ezetimibe     REACTION: muscle aches  . Sulfamethoxazole W-Trimethoprim    Current Outpatient Prescriptions on File Prior to Visit  Medication Sig Dispense Refill  . acetaminophen (TYLENOL) 500 MG tablet Take 500 mg by mouth every 4 (four) hours as needed.        Marland Kitchen aspirin 81 MG tablet Take 81 mg by mouth daily.        . Cholecalciferol (VITAMIN D-3) 5000 UNITS TABS Take 1 tablet by mouth daily.        . citalopram (CELEXA) 20 MG tablet Take 1 tablet (20 mg total) by mouth daily.  90 tablet  3  . esomeprazole (NEXIUM) 40 MG capsule Take 1 capsule (40 mg total) by mouth 2 (two) times daily.  180 capsule  3  . estradiol (VIVELLE-DOT) 0.075 MG/24HR 1/2 patch applied twice  a week       . Omega-3 Fatty Acids (FISH OIL) 1000 MG CAPS Take 1 capsule by mouth daily.        . traZODone (DESYREL) 50 MG tablet Take one to one and a half tablets by mouth at bedtime as needed       . Calcium Carbonate-Vitamin D (TH CALCIUM CARBONATE-VITAMIN D) 600-400 MG-UNIT per tablet Take 1 tablet by mouth daily.        . chlorpheniramine-hydrocodone (TUSSIONEX) 8-10 MG/5ML suspension Take 5 mLs by mouth every 12 (twelve) hours as needed for cough.  60 mL  0  . Melatonin 10 MG TABS OTC as directed           Review of Systems    Review of Systems  Constitutional: Negative for fever, appetite change, fatigue and unexpected weight change.  Eyes: Negative for pain and visual disturbance.  Respiratory: Negative for cough and shortness of breath.   Cardiovascular: Negative for cp or palpitations    Gastrointestinal: Negative for nausea, diarrhea and constipation.  Genitourinary: Negative for urgency and frequency.  Skin: Negative for pallor or rash   Neurological: Negative for weakness, light-headedness, numbness and headaches.  Hematological: Negative for adenopathy. Does not bruise/bleed easily.  Psychiatric/Behavioral: Negative for dysphoric mood. The patient is not nervous/anxious.      Objective:   Physical Exam  Constitutional: She appears well-developed and well-nourished. No distress.  HENT:  Head: Normocephalic and atraumatic.  Mouth/Throat: Oropharynx is clear and moist.  Eyes: Conjunctivae and EOM are normal. Pupils are equal, round, and reactive to light. No scleral icterus.  Neck: Normal range of motion. Neck supple. No JVD present. Carotid bruit is not present. No thyromegaly present.  Cardiovascular: Normal rate, regular rhythm, normal heart sounds and intact distal pulses.  Exam reveals no gallop.   Pulmonary/Chest: Effort normal and breath sounds normal. No respiratory distress. She has no wheezes.  Abdominal: Soft. Bowel sounds are normal. She exhibits no  distension, no abdominal bruit and no mass. There is no tenderness.  Musculoskeletal: Normal range of motion. She exhibits no edema and no tenderness.  Lymphadenopathy:    She has no cervical adenopathy.  Neurological:  She is alert. She has normal reflexes. No cranial nerve deficit. She exhibits normal muscle tone. Coordination normal.  Skin: Skin is warm and dry. No rash noted. No erythema. No pallor.  Psychiatric: She has a normal mood and affect.          Assessment & Plan:

## 2012-03-03 ENCOUNTER — Encounter: Payer: 59 | Admitting: Family Medicine

## 2012-03-03 ENCOUNTER — Encounter: Payer: Self-pay | Admitting: Family Medicine

## 2012-07-12 ENCOUNTER — Ambulatory Visit (INDEPENDENT_AMBULATORY_CARE_PROVIDER_SITE_OTHER): Payer: 59 | Admitting: Family Medicine

## 2012-07-12 ENCOUNTER — Encounter: Payer: Self-pay | Admitting: Family Medicine

## 2012-07-12 VITALS — BP 122/84 | HR 76 | Temp 98.3°F | Ht 64.5 in | Wt 165.2 lb

## 2012-07-12 DIAGNOSIS — B354 Tinea corporis: Secondary | ICD-10-CM

## 2012-07-12 MED ORDER — CLOTRIMAZOLE-BETAMETHASONE 1-0.05 % EX CREA: TOPICAL_CREAM | Freq: Two times a day (BID) | CUTANEOUS | Status: DC

## 2012-07-12 NOTE — Progress Notes (Signed)
Subjective:    Patient ID: [name], female    DOB: 1951/08/15, 61 y.o.   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 NEOPLASM OF UNCERTAIN BEHAVIOR OF SKIN  . DEPRESSION  . GERD  . ABDOMINAL PAIN, LEFT UPPER QUADRANT  . TRANSAMINASES, SERUM, ELEVATED  . INSECT BITE, LEG  . Routine general medical examination at a health care facility   Past Medical History  Diagnosis Date  . Depression   . GERD (gastroesophageal reflux disease)   . Fatty liver   . Sphincter of Oddi dysfunction    Past Surgical History  Procedure Date  . Mastectomy     bilateral for severe macrocystic breast  . Carpal tunnel release 08/2008    left  . Ercp    History  Substance Use Topics  . Smoking status: Never Smoker   . Smokeless tobacco: Not on file  . Alcohol Use: Yes   Family History  Problem Relation Age of Onset  . Hypertension Father   . Cancer Maternal Aunt     Pancreatic  . Diabetes Maternal Uncle   . Heart disease Maternal Grandmother    Allergies  Allergen Reactions  . Atorvastatin     REACTION: rash  . Ezetimibe     REACTION: muscle aches  . Sulfamethoxazole W-Trimethoprim    Current Outpatient Prescriptions on File Prior to Visit  Medication Sig Dispense Refill  . acetaminophen (TYLENOL) 500 MG tablet Take 500 mg by mouth every 4 (four) hours as needed.        Marland Kitchen aspirin 81 MG tablet Take 81 mg by mouth daily.        Marland Kitchen esomeprazole (NEXIUM) 40 MG capsule Take 1 capsule (40 mg total) by mouth 2 (two) times daily.  180 capsule  3  . estradiol (VIVELLE-DOT) 0.075 MG/24HR 1/2 patch applied twice a week         . Omega-3 Fatty Acids (FISH OIL) 1000 MG CAPS Take 1 capsule by mouth daily.        . traZODone (DESYREL) 50 MG tablet Take one to one and a half tablets by mouth at bedtime as needed       . Calcium Carbonate-Vitamin D (TH CALCIUM CARBONATE-VITAMIN D) 600-400 MG-UNIT per tablet Take 1 tablet by mouth daily.        . Cholecalciferol (VITAMIN D-3) 5000 UNITS TABS Take 1 tablet by mouth daily.        . cyclobenzaprine (FLEXERIL) 10 MG tablet Take 1 tablet (10 mg total) by mouth daily as needed for muscle spasms.  90 tablet  1      Review of Systems    Review of Systems  Constitutional: Negative for fever, appetite change, fatigue and unexpected weight change.  Eyes: Negative for pain and visual disturbance.  Respiratory: Negative for cough and shortness of breath.   Cardiovascular: Negative for cp or palpitations    Gastrointestinal: Negative for nausea, diarrhea and constipation.  Genitourinary: Negative for urgency and frequency.  Skin: Negative  for pallor and pos for itchy rash on foot Neurological: Negative for weakness, light-headedness, numbness and headaches.  Hematological: Negative for adenopathy. Does not bruise/bleed easily.  Psychiatric/Behavioral: Negative for dysphoric mood. The patient is not nervous/anxious.      Objective:   Physical Exam  Constitutional: She appears well-developed and well-nourished. No distress.  HENT:  Head: Normocephalic and atraumatic.  Mouth/Throat: Oropharynx is clear and moist.  Eyes: Conjunctivae normal and EOM are normal. Pupils are equal, round, and reactive to light. Right eye exhibits no discharge. Left eye exhibits no discharge. No scleral icterus.  Neck: Normal range of motion. Neck supple. Carotid bruit is not present.  Cardiovascular: Normal rate, regular rhythm and intact distal pulses.   Musculoskeletal: She exhibits no edema.  Lymphadenopathy:    She has no cervical adenopathy.  Neurological: She is alert.  Skin: Skin is warm.  Rash noted. There is erythema.       L heel 2 by 2 cm oval area of erythema with some central clearing and raised border with scale No drainage or vesicle  Psychiatric: She has a normal mood and affect.          Assessment & Plan:

## 2012-07-12 NOTE — Assessment & Plan Note (Signed)
Area of redness/ scale and central clearing on back of L heel that itches 2 by 2 cm  Consistent with ringworm tx with lotrisone and update  Will call if wose or no imp

## 2012-07-12 NOTE — Patient Instructions (Addendum)
Use lotrisone cream twice daily  Keep area clean and dry  If worse or no improvement in a week - or if other new symptoms-please call

## 2012-08-25 ENCOUNTER — Other Ambulatory Visit: Payer: Self-pay | Admitting: Gynecology

## 2012-09-07 ENCOUNTER — Ambulatory Visit (INDEPENDENT_AMBULATORY_CARE_PROVIDER_SITE_OTHER): Payer: 59 | Admitting: *Deleted

## 2012-09-07 DIAGNOSIS — Z2911 Encounter for prophylactic immunotherapy for respiratory syncytial virus (RSV): Secondary | ICD-10-CM

## 2012-09-07 DIAGNOSIS — Z23 Encounter for immunization: Secondary | ICD-10-CM

## 2012-09-29 ENCOUNTER — Ambulatory Visit (INDEPENDENT_AMBULATORY_CARE_PROVIDER_SITE_OTHER): Payer: 59 | Admitting: Family Medicine

## 2012-09-29 ENCOUNTER — Encounter: Payer: Self-pay | Admitting: Family Medicine

## 2012-09-29 VITALS — BP 116/74 | HR 69 | Temp 98.5°F | Ht 64.5 in | Wt 157.2 lb

## 2012-09-29 DIAGNOSIS — J069 Acute upper respiratory infection, unspecified: Secondary | ICD-10-CM | POA: Insufficient documentation

## 2012-09-29 DIAGNOSIS — J029 Acute pharyngitis, unspecified: Secondary | ICD-10-CM

## 2012-09-29 LAB — POCT RAPID STREP A (OFFICE): Rapid Strep A Screen: NEGATIVE

## 2012-09-29 MED ORDER — HYDROCODONE-HOMATROPINE 5-1.5 MG/5ML PO SYRP: 5.0000 mL | ORAL_SOLUTION | Freq: Three times a day (TID) | ORAL | Status: DC | PRN

## 2012-09-29 NOTE — Assessment & Plan Note (Signed)
Disc symptomatic care - see instructions on AVS  Will try hycodan cough susp Update if not starting to improve in a week or if worsening   Will rest and drink fluids this weekend- hope to return to work [employer]

## 2012-09-29 NOTE — Progress Notes (Signed)
Subjective:    Patient ID: [name], female    DOB: [birth year], 62 y.o.   MRN: [id number]  HPI Here with uri symptoms  Rapid strep test neg   Monday night - developed a ST out of the blue  tues - achey -- low grade fever if any  Now she is coughing - (worse at night) - some green / yellow mucous   Nasal- stuffiness / occ runny nose  R ear hurts  No facial pain   Did get her flu shot this year   Patient Active Problem List  Diagnosis  . NEOPLASM OF UNCERTAIN BEHAVIOR OF SKIN  . DEPRESSION  . GERD  . ABDOMINAL PAIN, LEFT UPPER QUADRANT  . TRANSAMINASES, SERUM, ELEVATED  . INSECT BITE, LEG  . Routine general medical examination at a health care facility  . Tinea corporis  . Viral URI with cough   Past Medical History  Diagnosis Date  . Depression   . GERD (gastroesophageal reflux disease)   . Fatty liver   . Sphincter of Oddi dysfunction    Past Surgical History  Procedure Date  . Mastectomy     bilateral for severe macrocystic breast  . Carpal tunnel release 08/2008    left  . Ercp    History  Substance Use Topics  . Smoking status: Never Smoker   . Smokeless tobacco: Not on file  . Alcohol Use: Yes     Comment: occ   Family History  Problem Relation Age of Onset  . Hypertension Father   . Cancer Maternal Aunt     Pancreatic  . Diabetes Maternal Uncle   . Heart disease Maternal Grandmother    Allergies  Allergen Reactions  . Atorvastatin     REACTION: rash  . Ezetimibe     REACTION: muscle aches  . Sulfamethoxazole W-Trimethoprim    Current Outpatient Prescriptions on File Prior to Visit  Medication Sig Dispense Refill  . acetaminophen (TYLENOL) 500 MG tablet Take 500 mg by mouth every 4 (four) hours as needed.        Marland Kitchen aspirin 81 MG tablet Take 81 mg by mouth daily.        . Calcium Carbonate-Vitamin D (TH CALCIUM CARBONATE-VITAMIN D) 600-400 MG-UNIT per tablet Take 1 tablet by mouth daily.        . Cholecalciferol (VITAMIN D-3) 5000 UNITS  TABS Take 1 tablet by mouth daily.        . cyclobenzaprine (FLEXERIL) 10 MG tablet Take 1 tablet (10 mg total) by mouth daily as needed for muscle spasms.  90 tablet  1  . esomeprazole (NEXIUM) 40 MG capsule Take 1 capsule (40 mg total) by mouth 2 (two) times daily.  180 capsule  3  . estradiol (VIVELLE-DOT) 0.075 MG/24HR 1/2 patch applied twice a week       . Omega-3 Fatty Acids (FISH OIL) 1000 MG CAPS Take 1 capsule by mouth daily.        . traZODone (DESYREL) 50 MG tablet Take one to one and a half tablets by mouth at bedtime as needed           Review of Systems Review of Systems  Constitutional: Negative for  appetite change,  and unexpected weight change.  ENt pos for cong and rhinorrhea and st, neg for sinus pain  Eyes: Negative for pain and visual disturbance.  Respiratory: Negative for wheeze  and shortness of breath.   Cardiovascular: Negative for cp or palpitations  Gastrointestinal: Negative for nausea, diarrhea and constipation.  Genitourinary: Negative for urgency and frequency.  Skin: Negative for pallor or rash   Neurological: Negative for weakness, light-headedness, numbness and headaches.  Hematological: Negative for adenopathy. Does not bruise/bleed easily.  Psychiatric/Behavioral: Negative for dysphoric mood. The patient is not nervous/anxious.         Objective:   Physical Exam  Constitutional: She appears well-developed and well-nourished. No distress.  HENT:  Head: Normocephalic and atraumatic.  Right Ear: External ear normal.  Left Ear: External ear normal.  Mouth/Throat: Oropharynx is clear and moist. No oropharyngeal exudate.       Nares are injected and congested  No sinus tenderness   Eyes: Conjunctivae normal and EOM are normal. Pupils are equal, round, and reactive to light. Right eye exhibits no discharge. Left eye exhibits no discharge.  Neck: Normal range of motion. Neck supple.  Cardiovascular: Normal rate and regular rhythm.    Pulmonary/Chest: Effort normal and breath sounds normal. No respiratory distress. She has no wheezes. She has no rales.  Lymphadenopathy:    She has no cervical adenopathy.  Neurological: She is alert.  Skin: Skin is warm and dry. No rash noted.  Psychiatric: She has a normal mood and affect.          Assessment & Plan:

## 2012-09-29 NOTE — Patient Instructions (Signed)
You have a viral upper respiratory infection  Try the hycodan at night for cough (or when you are not driving or working)  Also something with guifenesin will help loosen congestion  Drink lots of fluids and rest

## 2013-05-22 ENCOUNTER — Encounter: Payer: Self-pay | Admitting: Family Medicine

## 2013-05-22 ENCOUNTER — Ambulatory Visit (INDEPENDENT_AMBULATORY_CARE_PROVIDER_SITE_OTHER): Payer: 59 | Admitting: Family Medicine

## 2013-05-22 VITALS — BP 124/68 | HR 69 | Temp 98.3°F | Ht 64.5 in | Wt 158.8 lb

## 2013-05-22 DIAGNOSIS — R079 Chest pain, unspecified: Secondary | ICD-10-CM

## 2013-05-22 DIAGNOSIS — R0789 Other chest pain: Secondary | ICD-10-CM

## 2013-05-22 MED ORDER — ESOMEPRAZOLE MAGNESIUM 40 MG PO CPDR: 40.0000 mg | DELAYED_RELEASE_CAPSULE | Freq: Two times a day (BID) | ORAL | Status: DC

## 2013-05-22 NOTE — Progress Notes (Signed)
Subjective:    Patient ID: [name], female    DOB: [birth year], 62 y.o.   MRN: [id number]  HPI Here for chest discomfort  Has an area on L chest that hurts occ - feels deep One day at work - she had pain in both arms - sudden when she stood up - lasted no more than 5 min and dissapated gradually (weak feeling but not sob or cp at the time and no sweating or nausea) --not on exertion   Sometimes does have some exertional symptom -- cp with slt rad to jaw  No regular exercise   No palpitations Family history - distant M relatives with heart problems (but mother is ok -- she has a LBBB as well)   Some stress- caring for mother in her 90s  Nothing out of the ordinary   Lab Results  Component Value Date   CHOL 206* 02/24/2012   HDL 60.10 02/24/2012   LDLCALC 73 09/23/2006   LDLDIRECT 117.3 02/24/2012   TRIG 159.0* 02/24/2012   CHOLHDL 3 02/24/2012    No new GI symptoms Has chronic RUQ pain since her ERCP  Had cardiac cath 7-8 years ago for LBBB- it was clean along with an echo  Patient Active Problem List   Diagnosis Date Noted  . Viral URI with cough 09/29/2012  . Tinea corporis 07/12/2012  . Routine general medical examination at a health care facility 02/20/2012  . TRANSAMINASES, SERUM, ELEVATED 06/18/2010  . INSECT BITE, LEG 02/11/2009  . 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 DEPRESSION 06/20/2008  . GERD 06/20/2008  . ABDOMINAL PAIN, LEFT UPPER QUADRANT 06/20/2008   Past Medical History  Diagnosis Date  . Depression   . GERD (gastroesophageal reflux disease)   . Fatty liver   . Sphincter of Oddi dysfunction    Past Surgical History  Procedure Laterality Date  . Mastectomy      bilateral for severe macrocystic breast  . Carpal tunnel release  08/2008    left  . Ercp     History  Substance Use Topics  . Smoking status: Never Smoker   . Smokeless tobacco: Not on file  . Alcohol Use: Yes     Comment: occ   Family History  Problem Relation  Age of Onset  . Hypertension Father   . Cancer Maternal Aunt     Pancreatic  . Diabetes Maternal Uncle   . Heart disease Maternal Grandmother    Allergies  Allergen Reactions  . Atorvastatin     REACTION: rash  . Ezetimibe     REACTION: muscle aches  . Sulfamethoxazole W-Trimethoprim    Current Outpatient Prescriptions on File Prior to Visit  Medication Sig Dispense Refill  . acetaminophen (TYLENOL) 500 MG tablet Take 500 mg by mouth every 4 (four) hours as needed.        Marland Kitchen aspirin 81 MG tablet Take 81 mg by mouth daily.        Marland Kitchen esomeprazole (NEXIUM) 40 MG capsule Take 1 capsule (40 mg total) by mouth 2 (two) times daily.  180 capsule  3  . estradiol (ESTRACE VAGINAL) 0.1 MG/GM vaginal cream Place 1 g vaginally 2 (two) times a week.      . estradiol (VIVELLE-DOT) 0.075 MG/24HR 1/2 patch applied twice a week       . traZODone (DESYREL) 50 MG tablet 150 mg at bedtime.       . Cholecalciferol (VITAMIN D-3) 5000 UNITS TABS Take 1 tablet  by mouth daily.        . Omega-3 Fatty Acids (FISH OIL) 1000 MG CAPS Take 1 capsule by mouth daily.         No current facility-administered medications on file prior to visit.      Review of Systems Review of Systems  Constitutional: Negative for fever, appetite change, fatigue and unexpected weight change.  Eyes: Negative for pain and visual disturbance.  Respiratory: Negative for cough and shortness of breath.   Cardiovascular: Negative for palpitations , (can hear pulse in ears however), neg for PND/ orthopnea or pedal edema Gastrointestinal: Negative for nausea, diarrhea and constipation.  Genitourinary: Negative for urgency and frequency.  Skin: Negative for pallor or rash   Neurological: Negative for weakness, light-headedness, numbness and headaches.  Hematological: Negative for adenopathy. Does not bruise/bleed easily.  Psychiatric/Behavioral: Negative for dysphoric mood. The patient is not nervous/anxious.          Objective:    Physical Exam  Constitutional: She is oriented to person, place, and time. She appears well-developed and well-nourished. No distress.  HENT:  Head: Normocephalic and atraumatic.  Eyes: Conjunctivae and EOM are normal. Pupils are equal, round, and reactive to light. No scleral icterus.  Neck: Normal range of motion. Neck supple. No JVD present. Carotid bruit is not present. No thyromegaly present.  Cardiovascular: Normal rate, regular rhythm, normal heart sounds and intact distal pulses.  Exam reveals no gallop and no friction rub.   No murmur heard. Pulmonary/Chest: Effort normal and breath sounds normal. No respiratory distress. She has no wheezes. She has no rales. She exhibits tenderness.  No crackles  Scant tenderness over L ant cw -but not reprod symptoms  Abdominal: Soft. Bowel sounds are normal. She exhibits no distension and no mass. There is no tenderness.  Musculoskeletal: She exhibits no edema and no tenderness.  Lymphadenopathy:    She has no cervical adenopathy.  Neurological: She is alert and oriented to person, place, and time. She has normal reflexes.  Skin: Skin is warm and dry. No rash noted. No erythema. No pallor.  Psychiatric: She has a normal mood and affect.          Assessment & Plan:

## 2013-05-22 NOTE — Patient Instructions (Addendum)
We will refer you to cardiology at check out  If symptoms worsen/ persist - go to ER

## 2013-05-23 NOTE — Assessment & Plan Note (Signed)
In pt with a clear cath 7-8 y ago  Some stressors Symptoms are at times atypical but at other times exertional  LBBB on EKG Ref to cardiology for eval

## 2013-05-24 ENCOUNTER — Ambulatory Visit (INDEPENDENT_AMBULATORY_CARE_PROVIDER_SITE_OTHER): Payer: 59 | Admitting: Cardiology

## 2013-05-24 ENCOUNTER — Encounter: Payer: Self-pay | Admitting: Cardiology

## 2013-05-24 VITALS — BP 127/67 | HR 86 | Ht 64.5 in | Wt 158.8 lb

## 2013-05-24 DIAGNOSIS — R079 Chest pain, unspecified: Secondary | ICD-10-CM

## 2013-05-24 LAB — BASIC METABOLIC PANEL
BUN: 16 mg/dL (ref 6–23)
CO2: 27 mEq/L (ref 19–32)
Calcium: 9.2 mg/dL (ref 8.4–10.5)
Chloride: 108 mEq/L (ref 96–112)
Creatinine, Ser: 0.6 mg/dL (ref 0.4–1.2)
GFR: 109.17 mL/min (ref >60.00)
Glucose, Bld: 120 mg/dL — ABNORMAL HIGH (ref 70–99)
Potassium: 3.9 mEq/L (ref 3.5–5.1)
Sodium: 141 mEq/L (ref 135–145)

## 2013-05-24 LAB — LIPID PANEL
Cholesterol: 204 mg/dL — ABNORMAL HIGH (ref 0–200)
HDL: 58.2 mg/dL (ref >39.00)
Total CHOL/HDL Ratio: 4
Triglycerides: 221 mg/dL — ABNORMAL HIGH (ref 0.0–149.0)
VLDL: 44.2 mg/dL — ABNORMAL HIGH (ref 0.0–40.0)

## 2013-05-24 LAB — LDL CHOLESTEROL, DIRECT: Direct LDL: 119 mg/dL

## 2013-05-24 MED ORDER — NITROGLYCERIN 0.4 MG SL SUBL: 0.4000 mg | SUBLINGUAL_TABLET | SUBLINGUAL | Status: DC | PRN

## 2013-05-24 NOTE — Progress Notes (Signed)
Patient ID: [name], female   DOB: 08-05-1951, 62 y.o.   MRN: [id number]    Patient Name: [name] [facility] Date of Encounter: 05/24/2013  Primary Care Provider:  Roxy Manns, MD Primary Cardiologist:  Tobias Alexander, H   Patient Profile  Chest pain  Problem List   Past Medical History  Diagnosis Date  . Depression   . GERD (gastroesophageal reflux disease)   . Fatty liver   . Sphincter of Oddi dysfunction    Past Surgical History  Procedure Laterality Date  . Mastectomy      bilateral for severe macrocystic breast  . Carpal tunnel release  08/2008    left  . Ercp      Allergies  Allergies  Allergen Reactions  . Atorvastatin     REACTION: rash  . Ezetimibe     REACTION: muscle aches  . 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 The pains are predominantly exertional occuring when she is doing house chores. The pains usually last about 15 minutes and they disappear as she slows down with the activity. The pains started in the spring and they are increasing in frequency currently occuring about 3 times a week. She describes the pain as retrosternal, throbbing, radiating sometimes to her jaw and sometimes to her left arm. She describes one occasion when she stood up at work and experienced sudden onset weakness and pain in those upper extremities it was excruciating and slowly resolved. She denies any associated shortness of breath palpitations or prior syncopal episode. The patient has a history of left bundle branch block on EKG that was found prior to the carpal tunnel surgery 12 years ago. It was followed by an echocardiogram that was normal. Patient also underwent cardiac catheterization 7 years ago that was normal.  Home Medications  Prior to Admission medications   Medication Sig Start Date End Date Taking? Authorizing Provider  acetaminophen (TYLENOL) 500 MG  tablet Take 500 mg by mouth every 4 (four) hours as needed.     Yes Historical Provider, MD  aspirin 81 MG tablet Take 81 mg by mouth daily.     Yes Historical Provider, MD  Cholecalciferol (VITAMIN D-3) 5000 UNITS TABS Take 1 tablet by mouth daily.     Yes Historical Provider, MD  esomeprazole (NEXIUM) 40 MG capsule Take 1 capsule (40 mg total) by mouth 2 (two) times daily. 05/22/13  Yes Judy Pimple, MD  estradiol (ESTRACE VAGINAL) 0.1 MG/GM vaginal cream Place 1 g vaginally 2 (two) times a week.   Yes Historical Provider, MD  Omega-3 Fatty Acids (FISH OIL) 1000 MG CAPS Take 300 mg by mouth daily.    Yes Historical Provider, MD  traZODone (DESYREL) 50 MG tablet 150 mg at bedtime.    Yes Historical Provider, MD    Family History  Family History  Problem Relation Age of Onset  . Hypertension Father   . Cancer Maternal Aunt     Pancreatic  . Diabetes Maternal Uncle   . Heart disease Maternal Grandmother     Social History  History   Social History  . Marital Status: Married    Spouse Name: N/A    Number of Children: N/A  . Years of Education: N/A   Occupational History  . Not on file.   Social History Main Topics  . Smoking status: Never Smoker   . Smokeless  tobacco: Not on file  . Alcohol Use: Yes     Comment: occ  . Drug Use: No  . Sexual Activity: Not on file   Other Topics Concern  . Not on file   Social History Narrative  . No narrative on file     Review of Systems General:  No chills, fever, night sweats or weight changes.  Cardiovascular:  + chest pain, dyspnea on exertion, edema, orthopnea, palpitations, paroxysmal nocturnal dyspnea. Dermatological: No rash, lesions/masses Respiratory: No cough, dyspnea Urologic: No hematuria, dysuria Abdominal:   No nausea, vomiting, diarrhea, bright red blood per rectum, melena, or hematemesis Neurologic:  No visual changes, wkns, changes in mental status. All other systems reviewed and are otherwise negative except  as noted above.  Physical Exam  Blood pressure 127/67, pulse 86, height 5' 4.5" (1.638 m), weight 158 lb 12.8 oz (72.031 kg).  General: Pleasant, NAD Psych: Normal affect. Neuro: Alert and oriented X 3. Moves all extremities spontaneously. HEENT: Normal  Neck: Supple without bruits or JVD. Lungs:  Resp regular and unlabored, CTA. Heart: RRR no s3, s4, holosystolic murmur at the left lower sternal border. Abdomen: Soft, non-tender, non-distended, BS + x 4.  Extremities: No clubbing, cyanosis or edema. DP/PT/Radials 2+ and equal bilaterally.  Accessory Clinical Findings  ECG - SR, 85 BPM, LBBB  Cardiac cath 12/2005 ANGIOGRAPHIC DATA:  1. Left main coronary artery is normal.  2. Left anterior descending extended to the apex. There is some tortuosity  toward the apex but there is no focal obstructive disease. The left  anterior descending was normal.  3. Left circumflex: Left circumflex is a moderately large vessel. It is  normal.  4. Right coronary artery: The right coronary artery is normal. It is a  dominant vessel.  Left ventricular angiograms is performed in the RAO position. Overall  cardiac size and silhouette are normal. Global ejection fraction is 60%.  Regional wall motion is normal.  OVERALL IMPRESSION:  1. Normal left ventricular function.  2. Normal coronary arteries.   Assessment & Plan  62 year old female   1. Typical exertional chest pain - we will order an exercise stress test to assess ischemia and reproducibility of her symptoms on exertion  2. Hyperlipidemia - lipid panel not checked in more than a year, we will order  3. Systolic murmur - we will order TTE  Follow up in 2 weeks   Tobias Alexander, Rexene Edison, MD 05/24/2013, 9:12 AM

## 2013-05-24 NOTE — Patient Instructions (Addendum)
Your physician recommends that you return for lab work today: Lipid profile, BMET  Your physician has requested that you have an echocardiogram. Echocardiography is a painless test that uses sound waves to create images of your heart. It provides your doctor with information about the size and shape of your heart and how well your heart's chambers and valves are working. This procedure takes approximately one hour. There are no restrictions for this procedure.  Your physician has recommended you make the following change in your medication:  1) Start Nitroglycerin as needed for chest pain (per discussed guidelines)  Your physician has requested that you have en exercise stress myoview. For further information please visit [email or web address] Please follow instruction sheet, as given.  Your physician recommends that you schedule a follow-up appointment in: 2 weeks with Dr. Delton See

## 2013-06-04 ENCOUNTER — Encounter: Payer: Self-pay | Admitting: Internal Medicine

## 2013-06-04 ENCOUNTER — Ambulatory Visit (HOSPITAL_COMMUNITY): Payer: 59 | Attending: Cardiology | Admitting: Radiology

## 2013-06-04 ENCOUNTER — Other Ambulatory Visit (HOSPITAL_COMMUNITY): Payer: Self-pay | Admitting: Radiology

## 2013-06-04 ENCOUNTER — Other Ambulatory Visit (HOSPITAL_COMMUNITY): Payer: Self-pay | Admitting: Cardiology

## 2013-06-04 DIAGNOSIS — R072 Precordial pain: Secondary | ICD-10-CM | POA: Insufficient documentation

## 2013-06-04 DIAGNOSIS — R079 Chest pain, unspecified: Secondary | ICD-10-CM

## 2013-06-04 DIAGNOSIS — I447 Left bundle-branch block, unspecified: Secondary | ICD-10-CM | POA: Insufficient documentation

## 2013-06-04 DIAGNOSIS — R0602 Shortness of breath: Secondary | ICD-10-CM | POA: Insufficient documentation

## 2013-06-04 DIAGNOSIS — I079 Rheumatic tricuspid valve disease, unspecified: Secondary | ICD-10-CM | POA: Insufficient documentation

## 2013-06-04 DIAGNOSIS — I080 Rheumatic disorders of both mitral and aortic valves: Secondary | ICD-10-CM | POA: Insufficient documentation

## 2013-06-04 DIAGNOSIS — C50919 Malignant neoplasm of unspecified site of unspecified female breast: Secondary | ICD-10-CM | POA: Insufficient documentation

## 2013-06-04 NOTE — Progress Notes (Signed)
Echocardiogram performed.  

## 2013-06-05 ENCOUNTER — Ambulatory Visit (HOSPITAL_COMMUNITY): Payer: 59 | Attending: Cardiology | Admitting: Radiology

## 2013-06-05 VITALS — BP 123/82 | HR 68 | Ht 64.0 in | Wt 156.0 lb

## 2013-06-05 DIAGNOSIS — R42 Dizziness and giddiness: Secondary | ICD-10-CM | POA: Insufficient documentation

## 2013-06-05 DIAGNOSIS — I447 Left bundle-branch block, unspecified: Secondary | ICD-10-CM | POA: Insufficient documentation

## 2013-06-05 DIAGNOSIS — R5383 Other fatigue: Secondary | ICD-10-CM | POA: Insufficient documentation

## 2013-06-05 DIAGNOSIS — R079 Chest pain, unspecified: Secondary | ICD-10-CM

## 2013-06-05 DIAGNOSIS — R0789 Other chest pain: Secondary | ICD-10-CM | POA: Insufficient documentation

## 2013-06-05 DIAGNOSIS — R002 Palpitations: Secondary | ICD-10-CM | POA: Insufficient documentation

## 2013-06-05 DIAGNOSIS — R0609 Other forms of dyspnea: Secondary | ICD-10-CM | POA: Insufficient documentation

## 2013-06-05 DIAGNOSIS — M79609 Pain in unspecified limb: Secondary | ICD-10-CM | POA: Insufficient documentation

## 2013-06-05 DIAGNOSIS — R5381 Other malaise: Secondary | ICD-10-CM | POA: Insufficient documentation

## 2013-06-05 DIAGNOSIS — R0989 Other specified symptoms and signs involving the circulatory and respiratory systems: Secondary | ICD-10-CM | POA: Insufficient documentation

## 2013-06-05 DIAGNOSIS — R110 Nausea: Secondary | ICD-10-CM | POA: Insufficient documentation

## 2013-06-05 MED ORDER — ADENOSINE (DIAGNOSTIC) 3 MG/ML IV SOLN
0.5600 mg/kg | Freq: Once | INTRAVENOUS | Status: AC
  Administered 2013-06-05: 39.6 mg via INTRAVENOUS

## 2013-06-05 MED ORDER — TECHNETIUM TC 99M SESTAMIBI GENERIC - CARDIOLITE
11.0000 | Freq: Once | INTRAVENOUS | Status: AC | PRN
  Administered 2013-06-05: 11 via INTRAVENOUS

## 2013-06-05 MED ORDER — TECHNETIUM TC 99M SESTAMIBI GENERIC - CARDIOLITE
33.0000 | Freq: Once | INTRAVENOUS | Status: AC | PRN
  Administered 2013-06-05: 33 via INTRAVENOUS

## 2013-06-05 NOTE — Progress Notes (Signed)
[facility] SITE 3 NUCLEAR MED [address] [place], [place] [address] [phone]    Cardiology Nuclear Med Study  [name] is a 62 y.o. female     MRN : [id number]     DOB: 1951-03-10  Procedure Date: 06/05/2013  Nuclear Med Background Indication for Stress Test:  Evaluation for Ischemia History:  '07 OZH:[id number]>MWUX:LK=44%,WNUUVO Cardiac Risk Factors: LBBB  Symptoms:  Chest Pain (last date of chest discomfort was today) that radiated into her jaw and had left arm pain , DOE, Fatigue with Exertion, Light-Headedness, Nausea and Palpitations   Nuclear Pre-Procedure Caffeine/Decaff Intake:  None NPO After: 8:30pm    O2 Sat: not obtained%  IV 0.9% NS with Angio Cath:  22g  IV Site: R Antecubital  IV Started by:  Rickard Patience  Chest Size (in):  38 Cup Size: B  Height: 5\' 4"  (1.626 m)  Weight:  156 lb (70.761 kg)  BMI:  Body mass index is 26.76 kg/(m^2). Tech Comments:  n/a    Nuclear Med Study 1 or 2 day study: 1 day  Stress Test Type:  Adenosine  Reading MD: Cassell Clement, MD  Order Authorizing Provider:  Wyvonna Plum  Resting Radionuclide: Technetium 49m Sestamibi  Resting Radionuclide Dose: 10.8 mCi   Stress Radionuclide:  Technetium 72m Sestamibi  Stress Radionuclide Dose: 33.0 mCi           Stress Protocol Rest HR: 68 Stress HR: 94  Rest BP: 123/82 Stress BP: 110/56  Exercise Time (min): n/a METS: n/a   Predicted Max HR: 157 bpm % Max HR: 59.87 bpm Rate Pressure Product: 53664   Dose of Adenosine (mg):  39.7 Dose of Lexiscan: n/a mg  Dose of Atropine (mg): n/a Dose of Dobutamine: n/a mcg/kg/min (at max HR)  Stress Test Technologist: Nelson Chimes, BS-ES  Nuclear Technologist:  Doyne Keel, CNMT     Rest Procedure:  Myocardial perfusion imaging was performed at rest 45 minutes following the intravenous administration of Technetium 10m Sestamibi. Rest ECG: NSR-LBBB  Stress Procedure:  The patient received IV adenosine at  140 mcg/kg/min for 4 minutes.  Technetium 86m Sestamibi was injected at the 2 minute mark and quantitative spect images were obtained after a 45 minute delay. Patient complained of arms feeling heavy and hurting with left neck pain. Stress ECG: No significant change from baseline ECG  QPS Raw Data Images:  Normal; no motion artifact; normal heart/lung ratio. Stress Images:  There is decreased uptake in the anterior wall. Rest Images:  There is decreased uptake in the anterior wall. Subtraction (SDS):  There is a fixed defect that is most consistent with a previous infarction. Transient Ischemic Dilatation (Normal <1.22):  n/a Lung/Heart Ratio (Normal <0.45): 0.36  Quantitative Gated Spect Images QGS EDV: 83 ml QGS ESV: 23 ml  Impression Exercise Capacity:  Adenosine study with no exercise. BP Response:  Normal blood pressure response. Clinical Symptoms:  Arms heavy, left neck pain. ECG Impression:  Baseline:  LBBB.  EKG uninterpretable due to LBBB at rest and stress. Comparison with Prior Nuclear Study: No images to compare  Overall Impression:  Low risk stress nuclear study. There is a small fixed defect of moderate severity involving the apical anterior and apical septal. There is no reversibility. Cannot totally exclude artefact from overlying breast.  The anterior wall moves vigorously on QGS imaging.  LV Ejection Fraction: 73%.  LV Wall Motion:  NL LV Function; NL Wall Motion  [employer]

## 2013-06-07 ENCOUNTER — Encounter (HOSPITAL_COMMUNITY): Payer: Self-pay | Admitting: Pharmacist

## 2013-06-07 ENCOUNTER — Ambulatory Visit (INDEPENDENT_AMBULATORY_CARE_PROVIDER_SITE_OTHER): Payer: 59 | Admitting: Cardiology

## 2013-06-07 VITALS — BP 121/76 | HR 74 | Ht 64.0 in | Wt 158.0 lb

## 2013-06-07 DIAGNOSIS — R079 Chest pain, unspecified: Secondary | ICD-10-CM

## 2013-06-07 DIAGNOSIS — Z01810 Encounter for preprocedural cardiovascular examination: Secondary | ICD-10-CM

## 2013-06-07 LAB — BASIC METABOLIC PANEL
BUN: 14 mg/dL (ref 6–23)
CO2: 26 mEq/L (ref 19–32)
Calcium: 9.2 mg/dL (ref 8.4–10.5)
Chloride: 105 mEq/L (ref 96–112)
Creatinine, Ser: 0.6 mg/dL (ref 0.4–1.2)
GFR: 111.34 mL/min (ref >60.00)
Glucose, Bld: 116 mg/dL — ABNORMAL HIGH (ref 70–99)
Potassium: 3.6 mEq/L (ref 3.5–5.1)
Sodium: 140 mEq/L (ref 135–145)

## 2013-06-07 LAB — PROTIME-INR
INR: 1.1 ratio — ABNORMAL HIGH (ref 0.8–1.0)
Prothrombin Time: 12.1 s (ref 10.2–12.4)

## 2013-06-07 LAB — CBC WITH DIFFERENTIAL/PLATELET
Basophils Absolute: 0 10*3/uL (ref 0.0–0.1)
Basophils Relative: 0.6 % (ref 0.0–3.0)
Eosinophils Absolute: 0.1 10*3/uL (ref 0.0–0.7)
Eosinophils Relative: 1.3 % (ref 0.0–5.0)
HCT: 38.8 % (ref 36.0–46.0)
Hemoglobin: 12.9 g/dL (ref 12.0–15.0)
Lymphocytes Relative: 29.7 % (ref 12.0–46.0)
Lymphs Abs: 2.1 10*3/uL (ref 0.7–4.0)
MCHC: 33.2 g/dL (ref 30.0–36.0)
MCV: 86.8 fl (ref 78.0–100.0)
Monocytes Absolute: 0.4 10*3/uL (ref 0.1–1.0)
Monocytes Relative: 5.3 % (ref 3.0–12.0)
Neutro Abs: 4.4 10*3/uL (ref 1.4–7.7)
Neutrophils Relative %: 63.1 % (ref 43.0–77.0)
Platelets: 281 10*3/uL (ref 150.0–400.0)
RBC: 4.47 Mil/uL (ref 3.87–5.11)
RDW: 13.3 % (ref 11.5–14.6)
WBC: 7 10*3/uL (ref 4.5–10.5)

## 2013-06-07 NOTE — Patient Instructions (Addendum)
**Note De-Identified Salinda Snedeker Obfuscation** Your physician has requested that you have a cardiac catheterization. Cardiac catheterization is used to diagnose and/or treat various heart conditions. Doctors may recommend this procedure for a number of different reasons. The most common reason is to evaluate chest pain. Chest pain can be a symptom of coronary artery disease (CAD), and cardiac catheterization can show whether plaque is narrowing or blocking your heart's arteries. This procedure is also used to evaluate the valves, as well as measure the blood flow and oxygen levels in different parts of your heart. For further information please visit [email or web address] Please follow instruction sheet, as given.  Your physician recommends that you return for lab work in: today   Your physician recommends that you schedule a follow-up appointment in: after

## 2013-06-07 NOTE — Progress Notes (Signed)
Patient ID: YAZLYNN BIRKELAND, female   DOB: [birth year], 62 y.o.   MRN: [id number]  Patient Name: [name] Date of Encounter: 06/07/2013  Primary Care Provider:  Roxy Manns, MD Primary Cardiologist:  Tobias Alexander, H  Patient Profile  Chest pain  Problem List   Past Medical History  Diagnosis Date  . Depression   . GERD (gastroesophageal reflux disease)   . Fatty liver   . Sphincter of Oddi dysfunction    Past Surgical History  Procedure Laterality Date  . Mastectomy      bilateral for severe macrocystic breast  . Carpal tunnel release  08/2008    left  . Ercp      Allergies  Allergies  Allergen Reactions  . Atorvastatin     REACTION: rash  . Ezetimibe     REACTION: muscle aches  . 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 The pains are predominantly exertional occuring when she is doing house chores. The pains usually last about 15 minutes and they disappear as she slows down with the activity. The pains started in the spring and they are increasing in frequency currently occuring about 3 times a week. She describes the pain as retrosternal, throbbing, radiating sometimes to her jaw and sometimes to her left arm. She describes one occasion when she stood up at work and experienced sudden onset weakness and pain in those upper extremities it was excruciating and slowly resolved. She denies any associated shortness of breath palpitations or prior syncopal episode. The patient has a history of left bundle branch block on EKG that was found prior to the carpal tunnel surgery 12 years ago. It was followed by an echocardiogram that was normal. Patient also underwent cardiac catheterization 7 years ago that was normal.  Home Medications  Prior to Admission medications   Medication Sig Start Date End Date Taking? Authorizing Provider  acetaminophen (TYLENOL) 500 MG tablet  Take 500 mg by mouth every 4 (four) hours as needed.     Yes Historical Provider, MD  aspirin 81 MG tablet Take 81 mg by mouth daily.     Yes Historical Provider, MD  Cholecalciferol (VITAMIN D-3) 5000 UNITS TABS Take 1 tablet by mouth daily.     Yes Historical Provider, MD  esomeprazole (NEXIUM) 40 MG capsule Take 1 capsule (40 mg total) by mouth 2 (two) times daily. 05/22/13  Yes Judy Pimple, MD  estradiol (ESTRACE VAGINAL) 0.1 MG/GM vaginal cream Place 1 g vaginally 2 (two) times a week.   Yes Historical Provider, MD  Omega-3 Fatty Acids (FISH OIL) 1000 MG CAPS Take 300 mg by mouth daily.    Yes Historical Provider, MD  traZODone (DESYREL) 50 MG tablet 150 mg at bedtime.    Yes Historical Provider, MD    Family History  Family History  Problem Relation Age of Onset  . Hypertension Father   . Cancer Maternal Aunt     Pancreatic  . Diabetes Maternal Uncle   . Heart disease Maternal Grandmother     Social History  History   Social History  . Marital Status: Married    Spouse Name: N/A    Number of Children: N/A  . Years of Education: N/A   Occupational History  . Not on file.   Social History Main Topics  . Smoking status: Never Smoker   . Smokeless tobacco: Not on file  .  Alcohol Use: Yes     Comment: occ  . Drug Use: No  . Sexual Activity: Not on file   Other Topics Concern  . Not on file   Social History Narrative  . No narrative on file     Review of Systems General:  No chills, fever, night sweats or weight changes.  Cardiovascular:  + chest pain, dyspnea on exertion, edema, orthopnea, palpitations, paroxysmal nocturnal dyspnea. Dermatological: No rash, lesions/masses Respiratory: No cough, dyspnea Urologic: No hematuria, dysuria Abdominal:   No nausea, vomiting, diarrhea, bright red blood per rectum, melena, or hematemesis Neurologic:  No visual changes, wkns, changes in mental status. All other systems reviewed and are otherwise negative except as noted  above.  Physical Exam  Blood pressure 121/76, pulse 74, height 5\' 4"  (1.626 m), weight 158 lb (71.668 kg).  General: Pleasant, NAD Psych: Normal affect. Neuro: Alert and oriented X 3. Moves all extremities spontaneously. HEENT: Normal  Neck: Supple without bruits or JVD. Lungs:  Resp regular and unlabored, CTA. Heart: RRR no s3, s4, holosystolic murmur at the left lower sternal border. Abdomen: Soft, non-tender, non-distended, BS + x 4.  Extremities: No clubbing, cyanosis or edema. DP/PT/Radials 2+ and equal bilaterally.  Accessory Clinical Findings  ECG - SR, 85 BPM, LBBB  Cardiac cath 12/2005 ANGIOGRAPHIC DATA:  1. Left main coronary artery is normal.  2. Left anterior descending extended to the apex. There is some tortuosity  toward the apex but there is no focal obstructive disease. The left  anterior descending was normal.  3. Left circumflex: Left circumflex is a moderately large vessel. It is  normal.  4. Right coronary artery: The right coronary artery is normal. It is a  dominant vessel.  Left ventricular angiograms is performed in the RAO position. Overall  cardiac size and silhouette are normal. Global ejection fraction is 60%.  Regional wall motion is normal.  OVERALL IMPRESSION:  1. Normal left ventricular function.  2. Normal coronary arteries.   Lipid Panel     Component Value Date/Time   CHOL 204* 05/24/2013 1015   TRIG 221.0* 05/24/2013 1015   HDL 58.20 05/24/2013 1015   CHOLHDL 4 05/24/2013 1015   VLDL 44.2* 05/24/2013 1015   LDLCALC 73 09/23/2006 1019    TTE 06/04/2013 Study Conclusions  - Left ventricle: The cavity size was normal. Wall thickness was increased in a pattern of mild LVH. Systolic function was normal. The estimated ejection fraction was in the range of 50% to 55%. Wall motion was normal; there were no regional wall motion abnormalities. Doppler parameters are consistent with abnormal left ventricular relaxation (grade 1 diastolic  dysfunction). - Aortic valve: Trivial regurgitation. - Mitral valve: Mild regurgitation. - Left atrium: The atrium was mildly dilated.  Adenosin nuclear stress test 06/06/2013 Impression  Exercise Capacity: Adenosine study with no exercise.  BP Response: Normal blood pressure response.  Clinical Symptoms: Arms heavy, left neck pain.  ECG Impression: Baseline: LBBB. EKG uninterpretable due to LBBB at rest and stress.  Comparison with Prior Nuclear Study: No images to compare  Overall Impression: Low risk stress nuclear study. There is a small fixed defect of moderate severity involving the apical anterior and apical septal. There is no reversibility. Cannot totally exclude artefact from overlying breast. The anterior wall moves vigorously on QGS imaging.  LV Ejection Fraction: 73%. LV Wall Motion: NL LV Function; NL Wall Motion   Assessment & Plan  62 year old female with typical progressively worsening exertional chest pain, that now  occurs almost daily. The patient is very uncomfortable and anxious about the situation. Despite stress test being negative for ischemia, there should be a consideration for 3 vessel disease with hypoperfusion in all 3 territories leading to falsely negative stress test. There was a concern for potential old MI in the distal LAD territory.  The patient was offered a cardiac CT to assess atherosclerotic plague. However, she is so uncomfortable, that she would like to get a final answer as soon as possible. We will schedule a  Cardiac cath with Dr Elease Hashimoto tomorrow.  I wanted to start Lipitor but the patient states that she doesn't tolerate statins (hives).   Tobias Alexander, Rexene Edison, MD 06/07/2013, 1:49 PM

## 2013-06-08 ENCOUNTER — Encounter (HOSPITAL_COMMUNITY)
Admission: RE | Disposition: A | Discharge status: Home / Self Care | Payer: Self-pay | Source: Ambulatory Visit | Attending: Cardiovascular Disease

## 2013-06-08 ENCOUNTER — Ambulatory Visit (HOSPITAL_COMMUNITY)
Admission: RE | Admit: 2013-06-08 | Discharge: 2013-06-08 | Disposition: A | Discharge status: Home / Self Care | Payer: 59 | Source: Ambulatory Visit | Attending: Cardiovascular Disease | Admitting: Cardiovascular Disease

## 2013-06-08 ENCOUNTER — Encounter: Payer: Self-pay | Admitting: Cardiology

## 2013-06-08 DIAGNOSIS — R0789 Other chest pain: Secondary | ICD-10-CM | POA: Insufficient documentation

## 2013-06-08 DIAGNOSIS — R079 Chest pain, unspecified: Secondary | ICD-10-CM

## 2013-06-08 DIAGNOSIS — I447 Left bundle-branch block, unspecified: Secondary | ICD-10-CM | POA: Insufficient documentation

## 2013-06-08 HISTORY — PX: LEFT HEART CATHETERIZATION WITH CORONARY ANGIOGRAM: SHX5451

## 2013-06-08 SURGERY — LEFT HEART CATHETERIZATION WITH CORONARY ANGIOGRAM
Anesthesia: LOCAL

## 2013-06-08 MED ORDER — SODIUM CHLORIDE 0.9 % IJ SOLN: 3.0000 mL | Freq: Two times a day (BID) | INTRAMUSCULAR | Status: DC

## 2013-06-08 MED ORDER — LIDOCAINE HCL (PF) 1 % IJ SOLN
INTRAMUSCULAR | Status: AC
  Filled 2013-06-08: qty 30

## 2013-06-08 MED ORDER — ASPIRIN 81 MG PO CHEW: 81.0000 mg | CHEWABLE_TABLET | ORAL | Status: DC

## 2013-06-08 MED ORDER — MIDAZOLAM HCL 2 MG/2ML IJ SOLN
INTRAMUSCULAR | Status: AC
  Filled 2013-06-08: qty 2

## 2013-06-08 MED ORDER — FENTANYL CITRATE 0.05 MG/ML IJ SOLN
INTRAMUSCULAR | Status: AC
  Filled 2013-06-08: qty 2

## 2013-06-08 MED ORDER — HEPARIN SODIUM (PORCINE) 1000 UNIT/ML IJ SOLN
INTRAMUSCULAR | Status: AC
Start: 2013-06-08 — End: 2013-06-08
  Filled 2013-06-08: qty 1

## 2013-06-08 MED ORDER — VERAPAMIL HCL 2.5 MG/ML IV SOLN
INTRAVENOUS | Status: AC
  Filled 2013-06-08: qty 2

## 2013-06-08 MED ORDER — HEPARIN (PORCINE) IN NACL 2-0.9 UNIT/ML-% IJ SOLN
INTRAMUSCULAR | Status: AC
  Filled 2013-06-08: qty 1000

## 2013-06-08 MED ORDER — SODIUM CHLORIDE 0.9 % IV SOLN
INTRAVENOUS | Status: DC
  Administered 2013-06-08: 09:00:00 via INTRAVENOUS

## 2013-06-08 MED ORDER — SODIUM CHLORIDE 0.9 % IJ SOLN: 3.0000 mL | INTRAMUSCULAR | Status: DC | PRN

## 2013-06-08 MED ORDER — NITROGLYCERIN 0.2 MG/ML ON CALL CATH LAB
INTRAVENOUS | Status: AC
  Filled 2013-06-08: qty 1

## 2013-06-08 MED ORDER — SODIUM CHLORIDE 0.9 % IV SOLN: 250.0000 mL | INTRAVENOUS | Status: DC | PRN

## 2013-06-08 NOTE — Interval H&P Note (Signed)
Cath Lab Visit (complete for each Cath Lab visit)  Clinical Evaluation Leading to the Procedure:   ACS: no  Non-ACS:    Anginal Classification: CCS II  Anti-ischemic medical therapy: No Therapy  Non-Invasive Test Results: Low-risk stress test findings: cardiac mortality <1%/year  Prior CABG: No previous CABG      History and Physical Interval Note:  06/08/2013 10:44 AM  [name]  has presented today for surgery, with the diagnosis of cad  The various methods of treatment have been discussed with the patient and family. After consideration of risks, benefits and other options for treatment, the patient has consented to  Procedure(s): LEFT HEART CATHETERIZATION WITH CORONARY ANGIOGRAM (N/A) as a surgical intervention .  The patient's history has been reviewed, patient examined, no change in status, stable for surgery.  I have reviewed the patient's chart and labs.  Questions were answered to the patient's satisfaction.     Elyn Aquas.

## 2013-06-08 NOTE — CV Procedure (Signed)
     Cardiac Cath Note  [name] [id number] 17-Jun-1951  Procedure: Left  Heart Cardiac Catheterization Note Indications: chest pressure  Procedure Details Consent: Obtained Time Out: Verified patient identification, verified procedure, site/side was marked, verified correct patient position, special equipment/implants available, Radiology Safety Procedures followed,  medications/allergies/relevent history reviewed, required imaging and test results available.  Performed   Medications: Fentanyl: 100 mcg iv Versed: 3 mg IV Verapamil 3 mg iv Heparin 4000 units   The right radial l artery was easily canulated using a modified Seldinger technique.  Hemodynamics:    LV pressure: 103/8 Aortic pressure: 103/56  Angiography   Left Main: smooth and normal  Left anterior Descending: smooth and normal. diag is normal  Left Circumflex: smooth and normal  Right Coronary Artery:  Smooth and normal, large and dominant, PDA and PLSA is normal  LV Gram: normal LV function, EF 65%.    Complications: No apparent complications Patient did tolerate procedure well.  Contrast used: 100 cc  Conclusions:   1. Smooth and normal coronaries 2. normal LV function.  Vesta Mixer, Montez Hageman., MD, [facility] 06/08/2013, 11:56 AM Office - [phone] Pager [phone]

## 2013-06-08 NOTE — H&P (View-Only) (Signed)
Patient ID: Rilyn J Soper, female   DOB: 11/17/1950, 62 y.o.   MRN: [id number]  Patient Name: [name] Date of Encounter: 06/07/2013  Primary Care Provider:  Marne Tower, MD Primary Cardiologist:  [name], H  Patient Profile  Chest pain  Problem List   Past Medical History  Diagnosis Date  . Depression   . GERD (gastroesophageal reflux disease)   . Fatty liver   . Sphincter of Oddi dysfunction    Past Surgical History  Procedure Laterality Date  . Mastectomy      bilateral for severe macrocystic breast  . Carpal tunnel release  08/2008    left  . Ercp      Allergies  Allergies  Allergen Reactions  . Atorvastatin     REACTION: rash  . Ezetimibe     REACTION: muscle aches  . 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 The pains are predominantly exertional occuring when she is doing house chores. The pains usually last about 15 minutes and they disappear as she slows down with the activity. The pains started in the spring and they are increasing in frequency currently occuring about 3 times a week. She describes the pain as retrosternal, throbbing, radiating sometimes to her jaw and sometimes to her left arm. She describes one occasion when she stood up at work and experienced sudden onset weakness and pain in those upper extremities it was excruciating and slowly resolved. She denies any associated shortness of breath palpitations or prior syncopal episode. The patient has a history of left bundle branch block on EKG that was found prior to the carpal tunnel surgery 12 years ago. It was followed by an echocardiogram that was normal. Patient also underwent cardiac catheterization 7 years ago that was normal.  Home Medications  Prior to Admission medications   Medication Sig Start Date End Date Taking? Authorizing Provider  acetaminophen (TYLENOL) 500 MG tablet  Take 500 mg by mouth every 4 (four) hours as needed.     Yes Historical Provider, MD  aspirin 81 MG tablet Take 81 mg by mouth daily.     Yes Historical Provider, MD  Cholecalciferol (VITAMIN D-3) 5000 UNITS TABS Take 1 tablet by mouth daily.     Yes Historical Provider, MD  esomeprazole (NEXIUM) 40 MG capsule Take 1 capsule (40 mg total) by mouth 2 (two) times daily. 05/22/13  Yes Rose A Tower, MD  estradiol (ESTRACE VAGINAL) 0.1 MG/GM vaginal cream Place 1 g vaginally 2 (two) times a week.   Yes Historical Provider, MD  Omega-3 Fatty Acids (FISH OIL) 1000 MG CAPS Take 300 mg by mouth daily.    Yes Historical Provider, MD  traZODone (DESYREL) 50 MG tablet 150 mg at bedtime.    Yes Historical Provider, MD    Family History  Family History  Problem Relation Age of Onset  . Hypertension Father   . Cancer Maternal Aunt     Pancreatic  . Diabetes Maternal Uncle   . Heart disease Maternal Grandmother     Social History  History   Social History  . Marital Status: Married    Spouse Name: N/A    Number of Children: N/A  . Years of Education: N/A   Occupational History  . Not on file.   Social History Main Topics  . Smoking status: Never Smoker   . Smokeless tobacco: Not on file  .   Alcohol Use: Yes     Comment: occ  . Drug Use: No  . Sexual Activity: Not on file   Other Topics Concern  . Not on file   Social History Narrative  . No narrative on file     Review of Systems General:  No chills, fever, night sweats or weight changes.  Cardiovascular:  + chest pain, dyspnea on exertion, edema, orthopnea, palpitations, paroxysmal nocturnal dyspnea. Dermatological: No rash, lesions/masses Respiratory: No cough, dyspnea Urologic: No hematuria, dysuria Abdominal:   No nausea, vomiting, diarrhea, bright red blood per rectum, melena, or hematemesis Neurologic:  No visual changes, wkns, changes in mental status. All other systems reviewed and are otherwise negative except as noted  above.  Physical Exam  Blood pressure 121/76, pulse 74, height 5' 4" (1.626 m), weight 158 lb (71.668 kg).  General: Pleasant, NAD Psych: Normal affect. Neuro: Alert and oriented X 3. Moves all extremities spontaneously. HEENT: Normal  Neck: Supple without bruits or JVD. Lungs:  Resp regular and unlabored, CTA. Heart: RRR no s3, s4, holosystolic murmur at the left lower sternal border. Abdomen: Soft, non-tender, non-distended, BS + x 4.  Extremities: No clubbing, cyanosis or edema. DP/PT/Radials 2+ and equal bilaterally.  Accessory Clinical Findings  ECG - SR, 85 BPM, LBBB  Cardiac cath 12/2005 ANGIOGRAPHIC DATA:  1. Left main coronary artery is normal.  2. Left anterior descending extended to the apex. There is some tortuosity  toward the apex but there is no focal obstructive disease. The left  anterior descending was normal.  3. Left circumflex: Left circumflex is a moderately large vessel. It is  normal.  4. Right coronary artery: The right coronary artery is normal. It is a  dominant vessel.  Left ventricular angiograms is performed in the RAO position. Overall  cardiac size and silhouette are normal. Global ejection fraction is 60%.  Regional wall motion is normal.  OVERALL IMPRESSION:  1. Normal left ventricular function.  2. Normal coronary arteries.   Lipid Panel     Component Value Date/Time   CHOL 204* 05/24/2013 1015   TRIG 221.0* 05/24/2013 1015   HDL 58.20 05/24/2013 1015   CHOLHDL 4 05/24/2013 1015   VLDL 44.2* 05/24/2013 1015   LDLCALC 73 09/23/2006 1019    TTE 06/04/2013 Study Conclusions  - Left ventricle: The cavity size was normal. Wall thickness was increased in a pattern of mild LVH. Systolic function was normal. The estimated ejection fraction was in the range of 50% to 55%. Wall motion was normal; there were no regional wall motion abnormalities. Doppler parameters are consistent with abnormal left ventricular relaxation (grade 1 diastolic  dysfunction). - Aortic valve: Trivial regurgitation. - Mitral valve: Mild regurgitation. - Left atrium: The atrium was mildly dilated.  Adenosin nuclear stress test 06/06/2013 Impression  Exercise Capacity: Adenosine study with no exercise.  BP Response: Normal blood pressure response.  Clinical Symptoms: Arms heavy, left neck pain.  ECG Impression: Baseline: LBBB. EKG uninterpretable due to LBBB at rest and stress.  Comparison with Prior Nuclear Study: No images to compare  Overall Impression: Low risk stress nuclear study. There is a small fixed defect of moderate severity involving the apical anterior and apical septal. There is no reversibility. Cannot totally exclude artefact from overlying breast. The anterior wall moves vigorously on QGS imaging.  LV Ejection Fraction: 73%. LV Wall Motion: NL LV Function; NL Wall Motion   Assessment & Plan  62-year-old female with typical progressively worsening exertional chest pain, that now   occurs almost daily. The patient is very uncomfortable and anxious about the situation. Despite stress test being negative for ischemia, there should be a consideration for 3 vessel disease with hypoperfusion in all 3 territories leading to falsely negative stress test. There was a concern for potential old MI in the distal LAD territory.  The patient was offered a cardiac CT to assess atherosclerotic plague. However, she is so uncomfortable, that she would like to get a final answer as soon as possible. We will schedule a  Cardiac cath with Dr Nahser tomorrow.  I wanted to start Lipitor but the patient states that she doesn't tolerate statins (hives).   [name], H, MD 06/07/2013, 1:49 PM    

## 2013-06-21 ENCOUNTER — Ambulatory Visit: Payer: 59 | Admitting: Cardiology

## 2013-07-05 ENCOUNTER — Other Ambulatory Visit: Payer: Self-pay

## 2013-09-04 ENCOUNTER — Encounter: Payer: Self-pay | Admitting: Family Medicine

## 2014-05-03 ENCOUNTER — Encounter: Payer: Self-pay | Admitting: Family Medicine

## 2014-05-03 ENCOUNTER — Ambulatory Visit (INDEPENDENT_AMBULATORY_CARE_PROVIDER_SITE_OTHER): Payer: 59 | Admitting: Family Medicine

## 2014-05-03 VITALS — BP 124/76 | HR 75 | Temp 98.4°F | Ht 64.0 in | Wt 158.8 lb

## 2014-05-03 DIAGNOSIS — Z23 Encounter for immunization: Secondary | ICD-10-CM

## 2014-05-03 DIAGNOSIS — N959 Unspecified menopausal and perimenopausal disorder: Secondary | ICD-10-CM

## 2014-05-03 DIAGNOSIS — R7401 Elevation of levels of liver transaminase levels: Secondary | ICD-10-CM

## 2014-05-03 DIAGNOSIS — K219 Gastro-esophageal reflux disease without esophagitis: Secondary | ICD-10-CM

## 2014-05-03 DIAGNOSIS — R7402 Elevation of levels of lactic acid dehydrogenase (LDH): Secondary | ICD-10-CM

## 2014-05-03 DIAGNOSIS — Z0000 Encounter for general adult medical examination without abnormal findings: Secondary | ICD-10-CM

## 2014-05-03 DIAGNOSIS — R740 Nonspecific elevation of levels of transaminase and lactic acid dehydrogenase [LDH]: Secondary | ICD-10-CM

## 2014-05-03 LAB — COMPREHENSIVE METABOLIC PANEL
ALT: 18 U/L (ref 0–35)
AST: 19 U/L (ref 0–37)
Albumin: 4.2 g/dL (ref 3.5–5.2)
Alkaline Phosphatase: 50 U/L (ref 39–117)
BUN: 15 mg/dL (ref 6–23)
CO2: 27 mEq/L (ref 19–32)
Calcium: 9 mg/dL (ref 8.4–10.5)
Chloride: 106 mEq/L (ref 96–112)
Creatinine, Ser: 0.7 mg/dL (ref 0.4–1.2)
GFR: 92.4 mL/min (ref >60.00)
Glucose, Bld: 97 mg/dL (ref 70–99)
Potassium: 4.1 mEq/L (ref 3.5–5.1)
Sodium: 140 mEq/L (ref 135–145)
Total Bilirubin: 0.7 mg/dL (ref 0.2–1.2)
Total Protein: 7.5 g/dL (ref 6.0–8.3)

## 2014-05-03 MED ORDER — ESTRADIOL 0.1 MG/GM VA CREA: 1.0000 g | TOPICAL_CREAM | VAGINAL | Status: DC

## 2014-05-03 MED ORDER — ESTRADIOL 0.075 MG/24HR TD PTTW: 1.0000 | MEDICATED_PATCH | TRANSDERMAL | Status: DC

## 2014-05-03 MED ORDER — ESOMEPRAZOLE MAGNESIUM 40 MG PO CPDR: 40.0000 mg | DELAYED_RELEASE_CAPSULE | Freq: Two times a day (BID) | ORAL | Status: DC

## 2014-05-03 NOTE — Assessment & Plan Note (Signed)
Pt's gyn retired No longer needs mammogams due to mastectomy Refilled estrogen patch and cream  Disc risks of blood clot with these (pros/cons/risks) She chooses to stay on them

## 2014-05-03 NOTE — Patient Instructions (Signed)
Lab today  Liver tests today  Flu shot and tetanus shot today

## 2014-05-03 NOTE — Assessment & Plan Note (Signed)
Lab today occ RUQ pain despite hx of ccy and ercp  Overall much better

## 2014-05-03 NOTE — Assessment & Plan Note (Signed)
Refill nexium bid  Not able to come off of it  Works well [employer] for gerd

## 2014-05-03 NOTE — Progress Notes (Signed)
Pre visit review using our clinic review tool, if applicable. No additional management support is needed unless otherwise documented below in the visit note. 

## 2014-05-03 NOTE — Progress Notes (Signed)
Subjective:    Patient ID: [name], female    DOB: July 16, 1951, 63 y.o.   MRN: [id number]  HPI Here for f/u of chronic medical problems   All in all she feels ok   Some moments of irritability/anxiousness  Taking care of her mother - frustration She is sick and also irritable as well  Has 3 sisters - limited helpfulness   Need to refill nexium  Does great on this -no heartburn or stomach pain   Hx of elevated transaminases in the past  Needs a re check   Dr Ubaldo Glassing was her gyn - and he is retiring  On estrace and also vivelle  Does well  He wanted her on it "for life"- even has hot flashes on them  He though benefits outweighed the risks  Aware of increased risk for blood clots  Had bilat mastectomies  Has only a small area She gets mammograms - due to small amt of tissue left and implants (had silicone and then saline)  She gets them every 2-3 y    Patient Active Problem List   Diagnosis Date Noted  . Menopausal disorder 05/03/2014  . Chest pain 05/22/2013  . Tinea corporis 07/12/2012  . Routine general medical examination at a health care facility 02/20/2012  . TRANSAMINASES, SERUM, ELEVATED 06/18/2010  . INSECT BITE, LEG 02/11/2009  . 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 DEPRESSION 06/20/2008  . GERD 06/20/2008  . ABDOMINAL PAIN, LEFT UPPER QUADRANT 06/20/2008   Past Medical History  Diagnosis Date  . Depression   . GERD (gastroesophageal reflux disease)   . Fatty liver   . Sphincter of Oddi dysfunction    Past Surgical History  Procedure Laterality Date  . Mastectomy      bilateral for severe macrocystic breast  . Carpal tunnel release  08/2008    left  . Ercp     History  Substance Use Topics  . Smoking status: Never Smoker   . Smokeless tobacco: Not on file  . Alcohol Use: Yes     Comment: occ   Family History  Problem Relation Age of Onset  . Hypertension Father   . Cancer Maternal Aunt     Pancreatic  . Diabetes  Maternal Uncle   . Heart disease Maternal Grandmother    Allergies  Allergen Reactions  . Atorvastatin     REACTION: rash  . Ezetimibe     REACTION: muscle aches  . Sulfamethoxazole-Trimethoprim Other (See Comments)    Swelling of nostrils   Current Outpatient Prescriptions on File Prior to Visit  Medication Sig Dispense Refill  . acetaminophen (TYLENOL) 500 MG tablet Take 500 mg by mouth at bedtime.       . nitroGLYCERIN (NITROSTAT) 0.4 MG SL tablet Place 1 tablet (0.4 mg total) under the tongue every 5 (five) minutes as needed for chest pain.  100 tablet  3   No current facility-administered medications on file prior to visit.    Review of Systems Review of Systems  Constitutional: Negative for fever, appetite change, and unexpected weight change.  Eyes: Negative for pain and visual disturbance.  Respiratory: Negative for cough and shortness of breath.   Cardiovascular: Negative for cp or palpitations    Gastrointestinal: Negative for nausea, diarrhea and constipation.  Genitourinary: Negative for urgency and frequency.  Skin: Negative for pallor or rash   Neurological: Negative for weakness, light-headedness, numbness and headaches.  Hematological: Negative for adenopathy. Does not bruise/bleed easily.  Psychiatric/Behavioral: Negative for dysphoric mood. The patient is not nervous/anxious.  pos for stressors and fatigue        Objective:   Physical Exam  Constitutional: She appears well-developed and well-nourished. No distress.  HENT:  Head: Normocephalic and atraumatic.  Mouth/Throat: Oropharynx is clear and moist.  Eyes: Conjunctivae and EOM are normal. Pupils are equal, round, and reactive to light. Right eye exhibits no discharge. Left eye exhibits no discharge. No scleral icterus.  Neck: Normal range of motion. Neck supple. No JVD present. Carotid bruit is not present. No thyromegaly present.  Cardiovascular: Normal rate, regular rhythm, normal heart sounds and  intact distal pulses.   Pulmonary/Chest: Effort normal and breath sounds normal. No respiratory distress. She has no wheezes. She has no rales.  Abdominal: Soft. Bowel sounds are normal. She exhibits no distension, no abdominal bruit and no mass. There is no hepatosplenomegaly. There is no tenderness. There is no rebound.  Musculoskeletal: She exhibits no edema.  Lymphadenopathy:    She has no cervical adenopathy.  Neurological: She is alert. She has normal reflexes. No cranial nerve deficit. She exhibits normal muscle tone. Coordination normal.  Skin: Skin is warm and dry. No rash noted. No erythema. No pallor.  Psychiatric: She has a normal mood and affect.          Assessment & Plan:   Problem List Items Addressed This Visit     Digestive   GERD - Primary     Refill nexium bid  Not able to come off of it  Works well Disc diet for gerd     Relevant Medications      esomeprazole (NEXIUM) capsule     Other   TRANSAMINASES, SERUM, ELEVATED     Lab today occ RUQ pain despite hx of ccy and ercp  Overall much better     Relevant Orders      Comprehensive metabolic panel (Completed)   Routine general medical examination at a health care facility   Menopausal disorder     Pt's gyn retired No longer needs mammogams due to mastectomy Refilled estrogen patch and cream  Disc risks of blood clot with these (pros/cons/risks) She chooses to stay on them     Other Visit Diagnoses   Need for prophylactic vaccination and inoculation against influenza        Relevant Orders       Flu Vaccine QUAD 36+ mos PF IM (Fluarix Quad PF) (Completed)    Need for Tdap vaccination        Relevant Orders       Tdap vaccine greater than or equal to 7yo IM (Completed)

## 2014-08-08 ENCOUNTER — Encounter (HOSPITAL_COMMUNITY): Payer: Self-pay | Admitting: Cardiovascular Disease

## 2014-10-10 DIAGNOSIS — K219 Gastro-esophageal reflux disease without esophagitis: Secondary | ICD-10-CM | POA: Diagnosis not present

## 2014-10-10 DIAGNOSIS — R1011 Right upper quadrant pain: Secondary | ICD-10-CM | POA: Diagnosis not present

## 2014-10-10 DIAGNOSIS — R110 Nausea: Secondary | ICD-10-CM | POA: Diagnosis not present

## 2015-01-21 ENCOUNTER — Telehealth (INDEPENDENT_AMBULATORY_CARE_PROVIDER_SITE_OTHER): Payer: Medicare Other | Admitting: Family Medicine

## 2015-01-21 ENCOUNTER — Other Ambulatory Visit (INDEPENDENT_AMBULATORY_CARE_PROVIDER_SITE_OTHER): Payer: Medicare Other

## 2015-01-21 DIAGNOSIS — Z0000 Encounter for general adult medical examination without abnormal findings: Secondary | ICD-10-CM

## 2015-01-21 DIAGNOSIS — Z79899 Other long term (current) drug therapy: Secondary | ICD-10-CM | POA: Diagnosis not present

## 2015-01-21 LAB — CBC WITH DIFFERENTIAL/PLATELET
Basophils Absolute: 0 10*3/uL (ref 0.0–0.1)
Basophils Relative: 0.7 % (ref 0.0–3.0)
Eosinophils Absolute: 0.1 10*3/uL (ref 0.0–0.7)
Eosinophils Relative: 2.7 % (ref 0.0–5.0)
HCT: 39.3 % (ref 36.0–46.0)
Hemoglobin: 13 g/dL (ref 12.0–15.0)
Lymphocytes Relative: 44.9 % (ref 12.0–46.0)
Lymphs Abs: 2.2 10*3/uL (ref 0.7–4.0)
MCHC: 33 g/dL (ref 30.0–36.0)
MCV: 85 fl (ref 78.0–100.0)
Monocytes Absolute: 0.4 10*3/uL (ref 0.1–1.0)
Monocytes Relative: 8.9 % (ref 3.0–12.0)
Neutro Abs: 2.1 10*3/uL (ref 1.4–7.7)
Neutrophils Relative %: 42.8 % — ABNORMAL LOW (ref 43.0–77.0)
Platelets: 307 10*3/uL (ref 150.0–400.0)
RBC: 4.63 Mil/uL (ref 3.87–5.11)
RDW: 13.8 % (ref 11.5–15.5)
WBC: 4.9 10*3/uL (ref 4.0–10.5)

## 2015-01-21 LAB — LIPID PANEL
Cholesterol: 178 mg/dL (ref 0–200)
HDL: 58.8 mg/dL (ref >39.00)
LDL Cholesterol: 82 mg/dL (ref 0–99)
NonHDL: 119.2
Total CHOL/HDL Ratio: 3
Triglycerides: 184 mg/dL — ABNORMAL HIGH (ref 0.0–149.0)
VLDL: 36.8 mg/dL (ref 0.0–40.0)

## 2015-01-21 LAB — COMPREHENSIVE METABOLIC PANEL
ALT: 23 U/L (ref 0–35)
AST: 16 U/L (ref 0–37)
Albumin: 4.4 g/dL (ref 3.5–5.2)
Alkaline Phosphatase: 62 U/L (ref 39–117)
BUN: 13 mg/dL (ref 6–23)
CO2: 27 mEq/L (ref 19–32)
Calcium: 9.3 mg/dL (ref 8.4–10.5)
Chloride: 103 mEq/L (ref 96–112)
Creatinine, Ser: 0.57 mg/dL (ref 0.40–1.20)
GFR: 113.01 mL/min (ref >60.00)
Glucose, Bld: 111 mg/dL — ABNORMAL HIGH (ref 70–99)
Potassium: 4.1 mEq/L (ref 3.5–5.1)
Sodium: 137 mEq/L (ref 135–145)
Total Bilirubin: 0.9 mg/dL (ref 0.2–1.2)
Total Protein: 7 g/dL (ref 6.0–8.3)

## 2015-01-21 LAB — TSH: TSH: 1.5 u[IU]/mL (ref 0.35–4.50)

## 2015-01-21 NOTE — Telephone Encounter (Signed)
-----   Message from Ignacia Marvel, [place] sent at 01/21/2015  2:13 PM EDT ----- Please order labs on patient. They were drawn this AM and need to go out this afternoon. Thanks!

## 2015-01-23 ENCOUNTER — Other Ambulatory Visit: Payer: 59

## 2015-01-29 ENCOUNTER — Encounter: Payer: Self-pay | Admitting: Family Medicine

## 2015-01-29 ENCOUNTER — Ambulatory Visit (INDEPENDENT_AMBULATORY_CARE_PROVIDER_SITE_OTHER): Payer: Medicare Other | Admitting: Family Medicine

## 2015-01-29 VITALS — BP 127/78 | HR 83 | Temp 98.3°F | Ht 63.75 in | Wt 166.5 lb

## 2015-01-29 DIAGNOSIS — Z23 Encounter for immunization: Secondary | ICD-10-CM

## 2015-01-29 DIAGNOSIS — E2839 Other primary ovarian failure: Secondary | ICD-10-CM

## 2015-01-29 DIAGNOSIS — Z0000 Encounter for general adult medical examination without abnormal findings: Secondary | ICD-10-CM

## 2015-01-29 DIAGNOSIS — R739 Hyperglycemia, unspecified: Secondary | ICD-10-CM | POA: Insufficient documentation

## 2015-01-29 MED ORDER — CYCLOBENZAPRINE HCL 10 MG PO TABS: 10.0000 mg | ORAL_TABLET | Freq: Every day | ORAL | Status: DC | PRN

## 2015-01-29 MED ORDER — ESOMEPRAZOLE MAGNESIUM 40 MG PO CPDR: 40.0000 mg | DELAYED_RELEASE_CAPSULE | Freq: Two times a day (BID) | ORAL | Status: DC

## 2015-01-29 MED ORDER — ESTRADIOL 0.075 MG/24HR TD PTTW: 1.0000 | MEDICATED_PATCH | TRANSDERMAL | Status: DC

## 2015-01-29 NOTE — Progress Notes (Signed)
Subjective:    Patient ID: [name], female    DOB: March 15, 1951, 64 y.o.   MRN: [id number]  HPI Here for annual medicare wellness visit as well as chronic/acute medical problems  And preventative med   I have personally reviewed the Medicare Annual Wellness questionnaire and have noted 1. The patient's medical and social history 2. Their use of alcohol, tobacco or illicit drugs 3. Their current medications and supplements 4. The patient's functional ability including ADL's, fall risks, home safety risks and hearing or visual             impairment. 5. Diet and physical activities 6. Evidence for depression or mood disorders  The patients weight, height, BMI have been recorded in the chart and visual acuity is per eye clinic.  I have made referrals, counseling and provided education to the patient based review of the above and I have provided the pt with a written personalized care plan for preventive services. Reviewed and updated provider list, see scanned forms.  Doing well  Working on a house at [employer] generally pretty good  Stress - with her mother / gets upset about that  Not interested in counseling right now  Is taking care of herself   See scanned forms.  Routine anticipatory guidance given to patient.  See health maintenance. Colon cancer screening 12/14 - 10 year recall  Breast cancer screening- no more mammograms due to mastectomy  Self breast exam - of mammogram site- only has 10% breast tissue/ has some areas of contracture at times/no change  Flu vaccine 9/15 Tetanus vaccine 9/15  Pneumovax - will do the prevnar today Zoster vaccine 1/14  dexa - "about 6 y ago" - with her gyn - and was ok - wants to get another one - will go to the breast center in [facility]  She is not taking ca or D , no falls or fractures (does climb ladders)  Gyn provider retired  Last pap 2-3 y ago- no abn paps - no problems, no new partners  Advance directive - has a living  will and POA  Cognitive function addressed- see scanned forms- and if abnormal then additional documentation follows. -no major concerns   PMH and SH reviewed  Meds, vitals, and allergies reviewed.   ROS: See HPI.  Otherwise negative.    Est def  Not using vaginal cream because it makes her itch Still uses vivelle dot - 1/2 patch at a time -- she was told by Dr Ubaldo Glassing - to take it for life (to keep her "younger")  Only has 10 % of breast tissue    Results for orders placed or performed in visit on 01/21/15  CBC with Differential/Platelet  Result Value Ref Range   WBC 4.9 4.0 - 10.5 K/uL   RBC 4.63 3.87 - 5.11 Mil/uL   Hemoglobin 13.0 12.0 - 15.0 g/dL   HCT 39.3 36.0 - 46.0 %   MCV 85.0 78.0 - 100.0 fl   MCHC 33.0 30.0 - 36.0 g/dL   RDW 13.8 11.5 - 15.5 %   Platelets 307.0 150.0 - 400.0 K/uL   Neutrophils Relative % 42.8 (L) 43.0 - 77.0 %   Lymphocytes Relative 44.9 12.0 - 46.0 %   Monocytes Relative 8.9 3.0 - 12.0 %   Eosinophils Relative 2.7 0.0 - 5.0 %   Basophils Relative 0.7 0.0 - 3.0 %   Neutro Abs 2.1 1.4 - 7.7 K/uL   Lymphs Abs 2.2  0.7 - 4.0 K/uL   Monocytes Absolute 0.4 0.1 - 1.0 K/uL   Eosinophils Absolute 0.1 0.0 - 0.7 K/uL   Basophils Absolute 0.0 0.0 - 0.1 K/uL  Comprehensive metabolic panel  Result Value Ref Range   Sodium 137 135 - 145 mEq/L   Potassium 4.1 3.5 - 5.1 mEq/L   Chloride 103 96 - 112 mEq/L   CO2 27 19 - 32 mEq/L   Glucose, Bld 111 (H) 70 - 99 mg/dL   BUN 13 6 - 23 mg/dL   Creatinine, Ser 0.57 0.40 - 1.20 mg/dL   Total Bilirubin 0.9 0.2 - 1.2 mg/dL   Alkaline Phosphatase 62 39 - 117 U/L   AST 16 0 - 37 U/L   ALT 23 0 - 35 U/L   Total Protein 7.0 6.0 - 8.3 g/dL   Albumin 4.4 3.5 - 5.2 g/dL   Calcium 9.3 8.4 - 10.5 mg/dL   GFR 113.01 >60.00 mL/min  TSH  Result Value Ref Range   TSH 1.50 0.35 - 4.50 uIU/mL  Lipid panel  Result Value Ref Range   Cholesterol 178 0 - 200 mg/dL   Triglycerides 184.0 (H) 0.0 - 149.0 mg/dL   HDL 58.80 >39.00  mg/dL   VLDL 36.8 0.0 - 40.0 mg/dL   LDL Cholesterol 82 0 - 99 mg/dL   Total CHOL/HDL Ratio 3    NonHDL 119.20     Borderline glucose Is a sweet eater/ partial to chocolate and ice cream  Knows she can cut back  Active lifestyle/lot of physical work - no exercise program   Patient Active Problem List   Diagnosis Date Noted  . Encounter for Medicare annual wellness exam 01/29/2015  . Menopausal disorder 05/03/2014  . Chest pain 05/22/2013  . Tinea corporis 07/12/2012  . Routine general medical examination at a health care facility 02/20/2012  . TRANSAMINASES, SERUM, ELEVATED 06/18/2010  . INSECT BITE, LEG 02/11/2009  . 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 DEPRESSION 06/20/2008  . GERD 06/20/2008  . ABDOMINAL PAIN, LEFT UPPER QUADRANT 06/20/2008   Past Medical History  Diagnosis Date  . Depression   . GERD (gastroesophageal reflux disease)   . Fatty liver   . Sphincter of Oddi dysfunction    Past Surgical History  Procedure Laterality Date  . Mastectomy      bilateral for severe macrocystic breast  . Carpal tunnel release  08/2008    left  . Ercp    . Left heart catheterization with coronary angiogram N/A 06/08/2013    Procedure: LEFT HEART CATHETERIZATION WITH CORONARY ANGIOGRAM;  Surgeon: Ramond Dial, MD;  Location: [facility] CATH LAB;  Service: Cardiovascular;  Laterality: N/A;   History  Substance Use Topics  . Smoking status: Never Smoker   . Smokeless tobacco: Not on file  . Alcohol Use: 0.0 oz/week    0 Standard drinks or equivalent per week     Comment: occ   Family History  Problem Relation Age of Onset  . Hypertension Father   . Cancer Maternal Aunt     Pancreatic  . Diabetes Maternal Uncle   . Heart disease Maternal Grandmother    Allergies  Allergen Reactions  . Atorvastatin     REACTION: rash  . Ezetimibe     REACTION: muscle aches  . Sulfamethoxazole-Trimethoprim Other (See Comments)    Swelling of nostrils   Current  Outpatient Prescriptions on File Prior to Visit  Medication Sig Dispense Refill  . acetaminophen (TYLENOL)  500 MG tablet Take 500 mg by mouth at bedtime.     Marland Kitchen doxylamine, Sleep, (UNISOM) 25 MG tablet Take 25 mg by mouth at bedtime as needed.    Marland Kitchen esomeprazole (NEXIUM) 40 MG capsule Take 1 capsule (40 mg total) by mouth 2 (two) times daily. 180 capsule 3  . estradiol (ESTRACE VAGINAL) 0.1 MG/GM vaginal cream Place 3.35 Applicatorfuls vaginally 2 (two) times a week. Takes twice weekly as needed for vaginal dryness 42.5 g 3  . estradiol (VIVELLE-DOT) 0.075 MG/24HR Place 1 patch onto the skin 2 (two) times a week. 24 patch 3  . nitroGLYCERIN (NITROSTAT) 0.4 MG SL tablet Place 1 tablet (0.4 mg total) under the tongue every 5 (five) minutes as needed for chest pain. 100 tablet 3   No current facility-administered medications on file prior to visit.     Review of Systems Review of Systems  Constitutional: Negative for fever, appetite change, fatigue and unexpected weight change.  Eyes: Negative for pain and visual disturbance.  Respiratory: Negative for cough and shortness of breath.   Cardiovascular: Negative for cp or palpitations    Gastrointestinal: Negative for nausea, diarrhea and constipation.  Genitourinary: Negative for urgency and frequency.  Skin: Negative for pallor or rash   Neurological: Negative for weakness, light-headedness, numbness and headaches.  Hematological: Negative for adenopathy. Does not bruise/bleed easily.  Psychiatric/Behavioral: Negative for dysphoric mood. The patient is not nervous/anxious.         Objective:   Physical Exam  Constitutional: She appears well-developed and well-nourished. No distress.  overwt and well appearing   HENT:  Head: Normocephalic and atraumatic.  Right Ear: External ear normal.  Left Ear: External ear normal.  Nose: Nose normal.  Mouth/Throat: Oropharynx is clear and moist.  Eyes: Conjunctivae and EOM are normal. Pupils are  equal, round, and reactive to light. Right eye exhibits no discharge. Left eye exhibits no discharge. No scleral icterus.  Neck: Normal range of motion. Neck supple. No JVD present. Carotid bruit is not present. No thyromegaly present.  Cardiovascular: Normal rate, regular rhythm, normal heart sounds and intact distal pulses.  Exam reveals no gallop.   Pulmonary/Chest: Effort normal and breath sounds normal. No respiratory distress. She has no wheezes. She has no rales.  Abdominal: Soft. Bowel sounds are normal. She exhibits no distension and no mass. There is no tenderness.  Genitourinary:  Bilateral breast implants noted (post mastectomy) No tenderness, masses or LN  No skin changes   Musculoskeletal: She exhibits no edema or tenderness.  Lymphadenopathy:    She has no cervical adenopathy.  Neurological: She is alert. She has normal reflexes. No cranial nerve deficit. She exhibits normal muscle tone. Coordination normal.  Skin: Skin is warm and dry. No rash noted. No erythema. No pallor.  Psychiatric: She has a normal mood and affect.          Assessment & Plan:   Problem List Items Addressed This Visit    Encounter for Medicare annual wellness exam - Primary    Reviewed health habits including diet and exercise and skin cancer prevention Reviewed appropriate screening tests for age  Also reviewed health mt list, fam hx and immunization status , as well as social and family history   See HPI Labs reviewed prevnar vaccine today  Stop at check out for referral for bone density test  Try to get 1200-1500 mg of calcium per day with at least 1000 iu of vitamin D - for bone health  Estrogen deficiency    Pt chooses to continue vivelle dot patch for HRT - she has minimal breast tissue re: less breast cancer risk  This helps vasomotor symptoms greatly  Also may help bone density  dexa scheduled for bone density screening as well  Counseled on ca and D and safety as well         Relevant Orders   DG Bone Density   Hyperglycemia    This is new  Disc low glycemic diet to prevent DM as well as weight control  Will check A1C at next labs       Routine general medical examination at a health care facility    Reviewed health habits including diet and exercise and skin cancer prevention Reviewed appropriate screening tests for age  Also reviewed health mt list, fam hx and immunization status , as well as social and family history   See HPI Labs reviewed prevnar vaccine today  Stop at check out for referral for bone density test  Try to get 1200-1500 mg of calcium per day with at least 1000 iu of vitamin D - for bone health        Other Visit Diagnoses    Need for vaccination with 13-polyvalent pneumococcal conjugate vaccine        Relevant Orders    Pneumococcal conjugate vaccine 13-valent (Completed)

## 2015-01-29 NOTE — Progress Notes (Signed)
Pre visit review using our clinic review tool, if applicable. No additional management support is needed unless otherwise documented below in the visit note. 

## 2015-01-29 NOTE — Patient Instructions (Signed)
prevnar vaccine today  Stop at check out for referral for bone density test  Try to get 1200-1500 mg of calcium per day with at least 1000 iu of vitamin D - for bone health  Also watch sugar and fat in diet

## 2015-01-30 ENCOUNTER — Ambulatory Visit (INDEPENDENT_AMBULATORY_CARE_PROVIDER_SITE_OTHER)
Admission: RE | Admit: 2015-01-30 | Discharge: 2015-01-30 | Disposition: A | Discharge status: Home / Self Care | Payer: Medicare Other | Source: Ambulatory Visit | Attending: Family Medicine | Admitting: Family Medicine

## 2015-01-30 ENCOUNTER — Other Ambulatory Visit: Payer: Medicare Other

## 2015-01-30 DIAGNOSIS — E2839 Other primary ovarian failure: Secondary | ICD-10-CM | POA: Diagnosis not present

## 2015-01-30 NOTE — Assessment & Plan Note (Signed)
Reviewed health habits including diet and exercise and skin cancer prevention Reviewed appropriate screening tests for age  Also reviewed health mt list, fam hx and immunization status , as well as social and family history   See HPI Labs reviewed prevnar vaccine today  Stop at check out for referral for bone density test  Try to get 1200-1500 mg of calcium per day with at least 1000 iu of vitamin D - for bone health

## 2015-01-30 NOTE — Assessment & Plan Note (Signed)
This is new  Disc low glycemic diet to prevent DM as well as weight control  Will check A1C at next labs

## 2015-01-30 NOTE — Assessment & Plan Note (Signed)
Pt chooses to continue vivelle dot patch for HRT - she has minimal breast tissue re: less breast cancer risk  This helps vasomotor symptoms greatly  Also may help bone density  dexa scheduled for bone density screening as well  Counseled on ca and D and safety as well

## 2015-03-12 ENCOUNTER — Encounter: Payer: Self-pay | Admitting: *Deleted

## 2015-03-12 LAB — HM MAMMOGRAPHY

## 2015-03-20 ENCOUNTER — Encounter: Payer: Self-pay | Admitting: *Deleted

## 2015-04-03 ENCOUNTER — Ambulatory Visit (INDEPENDENT_AMBULATORY_CARE_PROVIDER_SITE_OTHER): Payer: Medicare Other | Admitting: Internal Medicine

## 2015-04-03 ENCOUNTER — Encounter: Payer: Self-pay | Admitting: Internal Medicine

## 2015-04-03 VITALS — BP 136/82 | HR 92 | Temp 98.8°F | Wt 167.0 lb

## 2015-04-03 DIAGNOSIS — J329 Chronic sinusitis, unspecified: Secondary | ICD-10-CM

## 2015-04-03 DIAGNOSIS — B349 Viral infection, unspecified: Secondary | ICD-10-CM

## 2015-04-03 DIAGNOSIS — B9789 Other viral agents as the cause of diseases classified elsewhere: Secondary | ICD-10-CM

## 2015-04-03 MED ORDER — HYDROCODONE-HOMATROPINE 5-1.5 MG/5ML PO SYRP: 5.0000 mL | ORAL_SOLUTION | Freq: Three times a day (TID) | ORAL | Status: DC | PRN

## 2015-04-03 NOTE — Patient Instructions (Signed)

## 2015-04-03 NOTE — Progress Notes (Signed)
HPI  Pt presents to the clinic today with c/o headache, facial pain and pressure, runny nose, sore throat and cough. This started 3-4 days ago. She is blowing clear mucous out of her nose. The cough is nonproductive. She denies fever, chills or body aches. She has tried Delsym, Tylenol cold and flu and Nyquil with minimal relief. She has no history of allergies or breathing problems. He has not had sick contacts.  Review of Systems    Past Medical History  Diagnosis Date  . Depression   . GERD (gastroesophageal reflux disease)   . Fatty liver   . Sphincter of Oddi dysfunction     Family History  Problem Relation Age of Onset  . Hypertension Father   . Cancer Maternal Aunt     Pancreatic  . Diabetes Maternal Uncle   . Heart disease Maternal Grandmother     History   Social History  . Marital Status: Married    Spouse Name: N/A  . Number of Children: N/A  . Years of Education: N/A   Occupational History  . Not on file.   Social History Main Topics  . Smoking status: Never Smoker   . Smokeless tobacco: Not on file  . Alcohol Use: 0.0 oz/week    0 Standard drinks or equivalent per week     Comment: occ  . Drug Use: No  . Sexual Activity: Not on file   Other Topics Concern  . Not on file   Social History Narrative    Allergies  Allergen Reactions  . Atorvastatin     REACTION: rash  . Ezetimibe     REACTION: muscle aches  . Sulfamethoxazole-Trimethoprim Other (See Comments)    Swelling of nostrils     Constitutional: Positive headache, fatigue . Denies fever or abrupt weight changes.  HEENT:  Positive eye pain, pressure behind the eyes, facial pain, nasal congestion and sore throat. Denies eye redness, ear pain, ringing in the ears, wax buildup, runny nose or bloody nose. Respiratory: Positive cough. Denies difficulty breathing or shortness of breath.  Cardiovascular: Denies chest pain, chest tightness, palpitations or swelling in the hands or feet.   No  other specific complaints in a complete review of systems (except as listed in HPI above).  Objective:  BP 136/82 mmHg  Pulse 92  Temp(Src) 98.8 F (37.1 C) (Oral)  Wt 167 lb (75.751 kg)  SpO2 98%   General: Appears his stated age,  in NAD. HEENT: Head: normal shape and size, no sinus tenderness noted; Eyes: sclera white, no icterus, conjunctiva pink, PERRLA and EOMs intact; Ears: Tm's gray and intact, normal light reflex; Nose: mucosa boggy and moist, septum midline; Throat/Mouth: + PND. Teeth present, mucosa pink and moist, no exudate noted, no lesions or ulcerations noted.  Neck: No adenopathy noted Cardiovascular: Normal rate and rhythm. S1,S2 noted.  No murmur, rubs or gallops noted. Pulmonary/Chest: Normal effort and positive vesicular breath sounds. No respiratory distress. No wheezes, rales or ronchi noted.      Assessment & Plan:   Acute viral sinusitis  Can use a Neti Pot which can be purchased from your local drug store. Flonase 2 sprays each nostril for 3 days and then as needed. Start taking Zyrtec daily If no improvement by Monday, call me back, will consider abx at that time  RTC as needed or if symptoms persist.

## 2015-04-03 NOTE — Progress Notes (Signed)
Pre visit review using our clinic review tool, if applicable. No additional management support is needed unless otherwise documented below in the visit note. 

## 2015-06-06 ENCOUNTER — Ambulatory Visit (INDEPENDENT_AMBULATORY_CARE_PROVIDER_SITE_OTHER): Payer: Medicare Other

## 2015-06-06 DIAGNOSIS — Z23 Encounter for immunization: Secondary | ICD-10-CM | POA: Diagnosis not present

## 2016-01-09 ENCOUNTER — Ambulatory Visit (INDEPENDENT_AMBULATORY_CARE_PROVIDER_SITE_OTHER): Payer: Medicare Other | Admitting: Family Medicine

## 2016-01-09 ENCOUNTER — Encounter: Payer: Self-pay | Admitting: Family Medicine

## 2016-01-09 VITALS — BP 121/79 | HR 83 | Temp 98.6°F | Ht 63.75 in | Wt 169.0 lb

## 2016-01-09 DIAGNOSIS — R030 Elevated blood-pressure reading, without diagnosis of hypertension: Secondary | ICD-10-CM

## 2016-01-09 NOTE — Progress Notes (Signed)
Pre visit review using our clinic review tool, if applicable. No additional management support is needed unless otherwise documented below in the visit note. 

## 2016-01-09 NOTE — Progress Notes (Signed)
   Subjective:    Patient ID: [name], female    DOB: [birth year], 65 y.o.   MRN: [id number]  HPI   65 year old female pt of Dr, Marliss Coots with history of  Depression, menopause presents with new onset elevated blood pressure. She reports that earlier in the week when she gave blood her BP was elevated at  130/94. ? HR History of chest pain work up in past.. cardiac cath normal. No new changes in  No SOB. No headache, except one 1 month ago. No vision changes. No neuro symptoms.  She has been more stressed out with mother's health.  She has not previous history of elevated BP.  No new medications. She is on estrogen.    BP Readings from Last 3 Encounters:  01/09/16 121/79  04/03/15 136/82  01/29/15 127/78   Wt Readings from Last 3 Encounters:  01/09/16 169 lb (76.658 kg)  04/03/15 167 lb (75.751 kg)  01/29/15 166 lb 8 oz (75.524 kg)   Social History /Family History/Past Medical History reviewed and updated if needed.   Review of Systems  Constitutional: Negative for fever and fatigue.  HENT: Negative for ear pain.   Eyes: Negative for pain.  Respiratory: Negative for chest tightness and shortness of breath.   Cardiovascular: Negative for chest pain, palpitations and leg swelling.  Gastrointestinal: Negative for abdominal pain.  Genitourinary: Negative for dysuria.       Objective:   Physical Exam  Constitutional: Vital signs are normal. She appears well-developed and well-nourished. She is cooperative.  Non-toxic appearance. She does not appear ill. No distress.  HENT:  Head: Normocephalic.  Right Ear: Hearing, tympanic membrane, external ear and ear canal normal. Tympanic membrane is not erythematous, not retracted and not bulging.  Left Ear: Hearing, tympanic membrane, external ear and ear canal normal. Tympanic membrane is not erythematous, not retracted and not bulging.  Nose: No mucosal edema or rhinorrhea. Right sinus exhibits no maxillary sinus tenderness  and no frontal sinus tenderness. Left sinus exhibits no maxillary sinus tenderness and no frontal sinus tenderness.  Mouth/Throat: Uvula is midline, oropharynx is clear and moist and mucous membranes are normal.  Eyes: Conjunctivae, EOM and lids are normal. Pupils are equal, round, and reactive to light. Lids are everted and swept, no foreign bodies found.  Neck: Trachea normal and normal range of motion. Neck supple. Carotid bruit is not present. No thyroid mass and no thyromegaly present.  Cardiovascular: Normal rate, regular rhythm, S1 normal, S2 normal, normal heart sounds, intact distal pulses and normal pulses.  Exam reveals no gallop and no friction rub.   No murmur heard. Pulmonary/Chest: Effort normal and breath sounds normal. No tachypnea. No respiratory distress. She has no decreased breath sounds. She has no wheezes. She has no rhonchi. She has no rales.  Abdominal: Soft. Normal appearance and bowel sounds are normal. There is no tenderness.  Neurological: She is alert.  Skin: Skin is warm, dry and intact. No rash noted.  Psychiatric: Her speech is normal and behavior is normal. Judgment and thought content normal. Her mood appears not anxious. Cognition and memory are normal. She does not exhibit a depressed mood.          Assessment & Plan:

## 2016-01-09 NOTE — Patient Instructions (Signed)
Follow BP at home, goal <140/90. Work on low salt low fat, low carb diet. Increase exercise and work on weight loss. Work on stress reduction.

## 2016-01-09 NOTE — Assessment & Plan Note (Signed)
BP well controlled today. Likely secondary to stress. Work on risk factor reduction, DASH diet reviewed, weight loss, exercise.  Follow at home and call if above goal.

## 2016-02-27 DIAGNOSIS — H2513 Age-related nuclear cataract, bilateral: Secondary | ICD-10-CM | POA: Diagnosis not present

## 2016-03-03 ENCOUNTER — Encounter: Payer: Self-pay | Admitting: Family Medicine

## 2016-03-03 ENCOUNTER — Ambulatory Visit (INDEPENDENT_AMBULATORY_CARE_PROVIDER_SITE_OTHER): Payer: Medicare Other | Admitting: Family Medicine

## 2016-03-03 ENCOUNTER — Ambulatory Visit (INDEPENDENT_AMBULATORY_CARE_PROVIDER_SITE_OTHER)
Admission: RE | Admit: 2016-03-03 | Discharge: 2016-03-03 | Disposition: A | Discharge status: Home / Self Care | Payer: Medicare Other | Source: Ambulatory Visit | Attending: Family Medicine | Admitting: Family Medicine

## 2016-03-03 VITALS — BP 110/82 | HR 85 | Temp 98.2°F | Ht 63.75 in | Wt 169.5 lb

## 2016-03-03 DIAGNOSIS — M25562 Pain in left knee: Secondary | ICD-10-CM

## 2016-03-03 DIAGNOSIS — M179 Osteoarthritis of knee, unspecified: Secondary | ICD-10-CM | POA: Diagnosis not present

## 2016-03-03 MED ORDER — TRAMADOL HCL 50 MG PO TABS: 50.0000 mg | ORAL_TABLET | Freq: Four times a day (QID) | ORAL | Status: DC | PRN

## 2016-03-03 MED ORDER — DICLOFENAC SODIUM 1 % TD GEL: 4.0000 g | Freq: Four times a day (QID) | TRANSDERMAL | Status: DC

## 2016-03-03 NOTE — Progress Notes (Signed)
Dr. Frederico Hamman T. Kaston Faughn, MD, [facility] [place], [address] Phone: [phone] Fax: [phone]  03/03/2016  Patient: [name], MRN: [id number], DOB: [birth year], 65 y.o.  Primary Physician:  Loura Pardon, MD   Chief Complaint  Patient presents with  . Knee Pain    Left   Subjective:   [name] is a 65 y.o. very pleasant female patient who presents with the following:  Was asleep and on Saturday morning woke up and knee was hurting her a lot. Has not slept for 2 nights. Hurting a lot medially, She does not recall any specific injury or incident. She has not really had a significant history of knee pain historically. No trauma or accident. She does not have a history of gout or pseudogout. Her knee has not been red, hot, or warm.  Past Medical History, Surgical History, Social History, Family History, Problem List, Medications, and Allergies have been reviewed and updated if relevant.  Patient Active Problem List   Diagnosis Date Noted  . Elevated blood-pressure reading without diagnosis of hypertension 01/09/2016  . Encounter for Medicare annual wellness exam 01/29/2015  . Estrogen deficiency 01/29/2015  . Hyperglycemia 01/29/2015  . Menopausal disorder 05/03/2014  . Chest pain 05/22/2013  . Tinea corporis 07/12/2012  . Routine general medical examination at a health care facility 02/20/2012  . TRANSAMINASES, SERUM, ELEVATED 06/18/2010  . INSECT BITE, LEG 02/11/2009  . 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 DEPRESSION 06/20/2008  . GERD 06/20/2008  . ABDOMINAL PAIN, LEFT UPPER QUADRANT 06/20/2008    Past Medical History  Diagnosis Date  . Depression   . GERD (gastroesophageal reflux disease)   . Fatty liver   . Sphincter of Oddi dysfunction     Past Surgical History  Procedure Laterality Date  . Mastectomy      bilateral for severe macrocystic breast  . Carpal tunnel release   08/2008    left  . Ercp    . Left heart catheterization with coronary angiogram N/A 06/08/2013    Procedure: LEFT HEART CATHETERIZATION WITH CORONARY ANGIOGRAM;  Surgeon: Ramond Dial, MD;  Location: [facility] CATH LAB;  Service: Cardiovascular;  Laterality: N/A;    Social History   Social History  . Marital Status: Married    Spouse Name: N/A  . Number of Children: N/A  . Years of Education: N/A   Occupational History  . Not on file.   Social History Main Topics  . Smoking status: Never Smoker   . Smokeless tobacco: Never Used  . Alcohol Use: 0.0 oz/week    0 Standard drinks or equivalent per week     Comment: occ  . Drug Use: No  . Sexual Activity: Not on file   Other Topics Concern  . Not on file   Social History Narrative    Family History  Problem Relation Age of Onset  . Hypertension Father   . Cancer Maternal Aunt     Pancreatic  . Diabetes Maternal Uncle   . Heart disease Maternal Grandmother     Allergies  Allergen Reactions  . Atorvastatin     REACTION: rash  . Ezetimibe     REACTION: muscle aches  . Sulfamethoxazole-Trimethoprim Other (See Comments)    Swelling of nostrils    Medication list reviewed and updated in full in [place].  GEN: No fevers, chills. Nontoxic. Primarily MSK c/o today. MSK: Detailed in  the HPI GI: tolerating PO intake without difficulty Neuro: No numbness, parasthesias, or tingling associated. Otherwise the pertinent positives of the ROS are noted above.   Objective:   BP 110/82 mmHg  Pulse 85  Temp(Src) 98.2 F (36.8 C) (Oral)  Ht 5' 3.75" (1.619 m)  Wt 169 lb 8 oz (76.885 kg)  BMI 29.33 kg/m2   GEN: WDWN, NAD, Non-toxic, Alert & Oriented x 3 HEENT: Atraumatic, Normocephalic.  Ears and Nose: No external deformity. EXTR: No clubbing/cyanosis/edema NEURO: Normal gait.  PSYCH: Normally interactive. Conversant. Not depressed or anxious appearing.  Calm demeanor.   Knee:  L Gait: Normal heel toe  pattern ROM: 0-120 Effusion: neg Echymosis or edema: none Patellar tendon NT Painful PLICA: neg Patellar grind: negative Medial and lateral patellar facet loading: negative medial and lateral joint lines: medial joint line pain Mcmurray's pos for pain Flexion-pinch neg Varus and valgus stress: stable Lachman: neg Ant and Post drawer: neg Hip abduction, IR, ER: WNL Hip flexion str: 5/5 Hip abd: 5/5 Quad: 5/5 VMO atrophy:No Hamstring concentric and eccentric: 5/5   Radiology: Dg Knee Ap/lat W/sunrise Left  03/03/2016  CLINICAL DATA:  Left medial and patellar knee pain for 4 days. No known injury. EXAM: LEFT KNEE 3 VIEWS COMPARISON:  None. FINDINGS: No evidence of fracture, dislocation, or joint effusion. Mild degenerative spurring is seen involving the lateral margin of patella. No evidence of joint space narrowing. No other signs of arthropathy or other bone lesions. IMPRESSION: No acute findings. Early degenerative spurring of the lateral patellar facet. Electronically Signed   By: Earle Gell M.D.   On: 03/03/2016 16:09     Assessment and Plan:   Left knee pain - Plan: DG Knee AP/LAT W/Sunrise Left  The patient does not tolerate anti-inflammatories. I think that topical Voltaren would be a good option for her along with tramadol when necessary for pain.  At this point, no significant internal derangement is clear. Cannot exclude degenerative meniscal pathology. Overall her joint spaces are relatively well preserved.  Follow-up: if not improving in 1 mo  New Prescriptions   DICLOFENAC SODIUM (VOLTAREN) 1 % GEL    Apply 4 g topically 4 (four) times daily. Branded Voltaren Gel   TRAMADOL (ULTRAM) 50 MG TABLET    Take 1 tablet (50 mg total) by mouth every 6 (six) hours as needed.   Orders Placed This Encounter  Procedures  . DG Knee AP/LAT W/Sunrise Left    Signed,  Kennidi Yoshida T. Emanuele Mcwhirter, MD   Patient's Medications  New Prescriptions   DICLOFENAC SODIUM (VOLTAREN) 1 % GEL     Apply 4 g topically 4 (four) times daily. Branded Voltaren Gel   TRAMADOL (ULTRAM) 50 MG TABLET    Take 1 tablet (50 mg total) by mouth every 6 (six) hours as needed.  Previous Medications   ACETAMINOPHEN (TYLENOL) 500 MG TABLET    Take 500 mg by mouth at bedtime.    CYCLOBENZAPRINE (FLEXERIL) 10 MG TABLET    Take 1 tablet (10 mg total) by mouth daily as needed for muscle spasms.   DOXYLAMINE, SLEEP, (UNISOM) 25 MG TABLET    Take 25 mg by mouth at bedtime as needed.   ESOMEPRAZOLE (NEXIUM) 40 MG CAPSULE    Take 1 capsule (40 mg total) by mouth 2 (two) times daily.   ESTRADIOL (ESTRACE VAGINAL) 0.1 MG/GM VAGINAL CREAM    Place [id number] Applicatorfuls vaginally 2 (two) times a week. Takes twice weekly as needed for vaginal dryness  ESTRADIOL (VIVELLE-DOT) 0.075 MG/24HR    Place 1 patch onto the skin 2 (two) times a week.   NITROGLYCERIN (NITROSTAT) 0.4 MG SL TABLET    Place 1 tablet (0.4 mg total) under the tongue every 5 (five) minutes as needed for chest pain.  Modified Medications   No medications on file  Discontinued Medications   No medications on file

## 2016-03-03 NOTE — Progress Notes (Signed)
Pre visit review using our clinic review tool, if applicable. No additional management support is needed unless otherwise documented below in the visit note. 

## 2016-03-04 ENCOUNTER — Telehealth: Payer: Self-pay | Admitting: *Deleted

## 2016-03-04 NOTE — Telephone Encounter (Signed)
PA for voltaren 1% gel done at [email or web address], I will await a response

## 2016-03-10 ENCOUNTER — Encounter: Payer: Self-pay | Admitting: Family Medicine

## 2016-03-10 DIAGNOSIS — M25562 Pain in left knee: Secondary | ICD-10-CM | POA: Diagnosis not present

## 2016-03-10 MED ORDER — DICLOFENAC SODIUM 1 % TD GEL: 4.0000 g | Freq: Four times a day (QID) | TRANSDERMAL | Status: DC

## 2016-03-10 NOTE — Telephone Encounter (Signed)
Please convert to generic per my prior script and assist with PA

## 2016-03-10 NOTE — Addendum Note (Signed)
Addended by: Carter Kitten on: 03/10/2016 03:07 PM   Modules accepted: Orders

## 2016-03-10 NOTE — Telephone Encounter (Signed)
[facility] advise me that they never received PA from [email or web address] but rep advise me that Volaren 1% gel isn't covered at all through pt's insurance we would have to send in a Rx for diclofenac sodium 1% gel and once that is sent then we would have to do a PA on the generic Rx

## 2016-03-10 NOTE — Telephone Encounter (Signed)
[place] did PA on 03/04/16 on CoverMyMeds.  She called insurance and they state they never received PA.  They also stated that they do not cover Brand Name Voltaren gel at all.  They state the Rx would need to be sent for generic diclofenac sodium and a PA would still have to be done on the generic.  Please advise.

## 2016-03-10 NOTE — Telephone Encounter (Signed)
Rx resent to [facility] for generic diclofenac gel.  PA completed on CoverMyMeds and approved.  Patient notified via MyChart message.

## 2016-03-10 NOTE — Telephone Encounter (Signed)
i sent it in on 03/03/2016.

## 2016-04-29 ENCOUNTER — Telehealth: Payer: Self-pay | Admitting: Family Medicine

## 2016-04-29 DIAGNOSIS — R739 Hyperglycemia, unspecified: Secondary | ICD-10-CM

## 2016-04-29 DIAGNOSIS — Z0000 Encounter for general adult medical examination without abnormal findings: Secondary | ICD-10-CM

## 2016-04-29 NOTE — Telephone Encounter (Signed)
-----   Message from Ellamae Sia sent at 04/27/2016  4:11 PM EDT ----- Regarding: Lab orders for  Wednesday, 9.6.17 Patient is scheduled for CPX labs, please order future labs, Thanks , Karna Christmas

## 2016-05-05 ENCOUNTER — Encounter: Payer: Self-pay | Admitting: Family Medicine

## 2016-05-05 ENCOUNTER — Other Ambulatory Visit (INDEPENDENT_AMBULATORY_CARE_PROVIDER_SITE_OTHER): Payer: Medicare Other

## 2016-05-05 ENCOUNTER — Ambulatory Visit (INDEPENDENT_AMBULATORY_CARE_PROVIDER_SITE_OTHER): Payer: Medicare Other | Admitting: Family Medicine

## 2016-05-05 VITALS — BP 132/86 | HR 86 | Resp 18 | Ht 64.0 in | Wt 170.0 lb

## 2016-05-05 DIAGNOSIS — N959 Unspecified menopausal and perimenopausal disorder: Secondary | ICD-10-CM | POA: Diagnosis not present

## 2016-05-05 DIAGNOSIS — R739 Hyperglycemia, unspecified: Secondary | ICD-10-CM

## 2016-05-05 DIAGNOSIS — Z0000 Encounter for general adult medical examination without abnormal findings: Secondary | ICD-10-CM

## 2016-05-05 DIAGNOSIS — Z780 Asymptomatic menopausal state: Secondary | ICD-10-CM

## 2016-05-05 LAB — COMPREHENSIVE METABOLIC PANEL
ALT: 29 U/L (ref 0–35)
AST: 18 U/L (ref 0–37)
Albumin: 4.4 g/dL (ref 3.5–5.2)
Alkaline Phosphatase: 52 U/L (ref 39–117)
BUN: 14 mg/dL (ref 6–23)
CO2: 29 mEq/L (ref 19–32)
Calcium: 9.4 mg/dL (ref 8.4–10.5)
Chloride: 101 mEq/L (ref 96–112)
Creatinine, Ser: 0.61 mg/dL (ref 0.40–1.20)
GFR: 104.09 mL/min (ref >60.00)
Glucose, Bld: 115 mg/dL — ABNORMAL HIGH (ref 70–99)
Potassium: 4.2 mEq/L (ref 3.5–5.1)
Sodium: 139 mEq/L (ref 135–145)
Total Bilirubin: 0.7 mg/dL (ref 0.2–1.2)
Total Protein: 7.3 g/dL (ref 6.0–8.3)

## 2016-05-05 LAB — CBC WITH DIFFERENTIAL/PLATELET
Basophils Absolute: 0 10*3/uL (ref 0.0–0.1)
Basophils Relative: 0.5 % (ref 0.0–3.0)
Eosinophils Absolute: 0.2 10*3/uL (ref 0.0–0.7)
Eosinophils Relative: 2.7 % (ref 0.0–5.0)
HCT: 39.6 % (ref 36.0–46.0)
Hemoglobin: 13.4 g/dL (ref 12.0–15.0)
Lymphocytes Relative: 38.3 % (ref 12.0–46.0)
Lymphs Abs: 2.7 10*3/uL (ref 0.7–4.0)
MCHC: 33.9 g/dL (ref 30.0–36.0)
MCV: 83.2 fl (ref 78.0–100.0)
Monocytes Absolute: 0.6 10*3/uL (ref 0.1–1.0)
Monocytes Relative: 8.1 % (ref 3.0–12.0)
Neutro Abs: 3.5 10*3/uL (ref 1.4–7.7)
Neutrophils Relative %: 50.4 % (ref 43.0–77.0)
Platelets: 297 10*3/uL (ref 150.0–400.0)
RBC: 4.76 Mil/uL (ref 3.87–5.11)
RDW: 13.9 % (ref 11.5–15.5)
WBC: 7 10*3/uL (ref 4.0–10.5)

## 2016-05-05 LAB — LIPID PANEL
Cholesterol: 195 mg/dL (ref 0–200)
HDL: 62.4 mg/dL (ref >39.00)
LDL Cholesterol: 102 mg/dL — ABNORMAL HIGH (ref 0–99)
NonHDL: 132.16
Total CHOL/HDL Ratio: 3
Triglycerides: 149 mg/dL (ref 0.0–149.0)
VLDL: 29.8 mg/dL (ref 0.0–40.0)

## 2016-05-05 LAB — HEMOGLOBIN A1C: Hgb A1c MFr Bld: 6.7 % — ABNORMAL HIGH (ref 4.6–6.5)

## 2016-05-05 MED ORDER — ESTRADIOL 0.1 MG/GM VA CREA: 1.0000 g | TOPICAL_CREAM | VAGINAL | 3 refills | Status: DC

## 2016-05-05 MED ORDER — ESTROGENS CONJUGATED 0.625 MG PO TABS: ORAL_TABLET | ORAL | 2 refills | Status: DC

## 2016-05-05 MED ORDER — ESTROGENS CONJUGATED 0.625 MG PO TABS: 0.6250 mg | ORAL_TABLET | Freq: Every day | ORAL | 2 refills | Status: DC

## 2016-05-05 NOTE — Progress Notes (Signed)
CLINIC ENCOUNTER NOTE  History:  65 y.o. [name] here today for HRT consult, previously prescribed by Dr. Ubaldo Glassing She denies any abnormal vaginal discharge, bleeding, pelvic pain or other concerns.   Hysterectomy 1980/81 due to ectopic, has one ovary (sure of side). Has been on HRT since that time.  Currently on patches -- just about out of patches. Uses 1/2 of 0.75 patch On estrogen cream   Past Medical History:  Diagnosis Date  . Depression   . Fatty liver   . Fibrocystic disease of both breasts   . GERD (gastroesophageal reflux disease)   . Sphincter of Oddi dysfunction     Past Surgical History:  Procedure Laterality Date  . CARPAL TUNNEL RELEASE  08/2008   left  . ERCP    . LEFT HEART CATHETERIZATION WITH CORONARY ANGIOGRAM N/A 06/08/2013   Procedure: LEFT HEART CATHETERIZATION WITH CORONARY ANGIOGRAM;  Surgeon: Ramond Dial, MD;  Location: [facility] CATH LAB;  Service: Cardiovascular;  Laterality: N/A;  . MASTECTOMY     bilateral for severe macrocystic breast    The following portions of the patient's history were reviewed and updated as appropriate: allergies, current medications, past family history, past medical history, past social history, past surgical history and problem list.   Health Maintenance:  Does not need pap smears due to TAH.  Normal mammogram on 2016.   Review of Systems:  Pertinent items noted in HPI and remainder of comprehensive ROS otherwise negative.  Objective:  Physical Exam BP 132/86 (BP Location: Right Arm, Patient Position: Sitting, Cuff Size: Normal)   Pulse 86   Resp 18   Ht 5\' 4"  (1.626 m)   Wt 170 lb (77.1 kg)   BMI 29.18 kg/m  CONSTITUTIONAL: Well-developed, well-nourished female in no acute distress.  HENT:  Normocephalic, atraumatic. External right and left ear normal. Oropharynx is clear and moist EYES: Conjunctivae and EOM are normal. Pupils are equal, round, and reactive to light. No scleral icterus.  NECK: Normal range of  motion, supple, no masses SKIN: Skin is warm and dry. No rash noted. Not diaphoretic. No erythema. No pallor. [place]: Alert and oriented to person, place, and time. Normal reflexes, muscle tone coordination. No cranial nerve deficit noted. PSYCHIATRIC: Normal mood and affect. Normal behavior. Normal judgment and thought content. CARDIOVASCULAR: Normal heart rate noted RESPIRATORY: Effort and breath sounds normal, no problems with respiration noted ABDOMEN: Soft, no distention noted.   PELVIC: Deferred MUSCULOSKELETAL: Normal range of motion. No edema noted.  Labs and Imaging No results found.  Assessment & Plan:  1. Postmenopausal - discussed risk/benefit of HRT after 60 and that increased risk of CVD, stroke, VTE, breast/ovarian pathology. Patient had bilateral mastectomy for fibrocystic breast disease and hysterectomy for ectopic. Reviewed continued benefit of estrace cream for UTI prevention. She has been on continuous HRT since ~1980.  - Recommended gradual decrease in oral estrogen.  - Consider Effexor for menopausal sx control in the future - estradiol (ESTRACE VAGINAL) 0.1 MG/GM vaginal cream; Place 1 g vaginally 2 (two) times a week. Takes twice weekly as needed for vaginal dryness  Dispense: 42.5 g; Refill: 3 -Premarin 0.625mg  - 1/2 tablet daily. If tolerating this change, plan to decrease to 42mg  tablets (1/2 tab daily)  Routine preventative health maintenance measures emphasized. Please refer to After Visit Summary for other counseling recommendations.   Return in about 4 weeks (around 06/02/2016) for HRT therapy.   Total face-to-face time with patient: 48minutes. Over 50% of encounter was spent  on counseling and coordination of care.

## 2016-05-06 LAB — TSH: TSH: 3.42 u[IU]/mL (ref 0.35–4.50)

## 2016-05-10 ENCOUNTER — Ambulatory Visit (INDEPENDENT_AMBULATORY_CARE_PROVIDER_SITE_OTHER): Payer: Medicare Other

## 2016-05-10 ENCOUNTER — Encounter: Payer: Self-pay | Admitting: Family Medicine

## 2016-05-10 ENCOUNTER — Ambulatory Visit (INDEPENDENT_AMBULATORY_CARE_PROVIDER_SITE_OTHER): Payer: Medicare Other | Admitting: Family Medicine

## 2016-05-10 VITALS — BP 116/70 | HR 84 | Temp 98.4°F | Ht 63.75 in | Wt 170.5 lb

## 2016-05-10 DIAGNOSIS — F430 Acute stress reaction: Secondary | ICD-10-CM | POA: Diagnosis not present

## 2016-05-10 DIAGNOSIS — R739 Hyperglycemia, unspecified: Secondary | ICD-10-CM | POA: Diagnosis not present

## 2016-05-10 DIAGNOSIS — Z0000 Encounter for general adult medical examination without abnormal findings: Secondary | ICD-10-CM

## 2016-05-10 DIAGNOSIS — K219 Gastro-esophageal reflux disease without esophagitis: Secondary | ICD-10-CM | POA: Diagnosis not present

## 2016-05-10 DIAGNOSIS — Z23 Encounter for immunization: Secondary | ICD-10-CM

## 2016-05-10 MED ORDER — ALPRAZOLAM 0.5 MG PO TABS: 0.5000 mg | ORAL_TABLET | Freq: Every evening | ORAL | 0 refills | Status: DC | PRN

## 2016-05-10 MED ORDER — PANTOPRAZOLE SODIUM 40 MG PO TBEC: 40.0000 mg | DELAYED_RELEASE_TABLET | Freq: Two times a day (BID) | ORAL | 3 refills | Status: DC

## 2016-05-10 MED ORDER — CYCLOBENZAPRINE HCL 10 MG PO TABS: 10.0000 mg | ORAL_TABLET | Freq: Every day | ORAL | 0 refills | Status: DC | PRN

## 2016-05-10 NOTE — Patient Instructions (Addendum)
[name] , Thank you for taking time to come for your Medicare Wellness Visit. I appreciate your ongoing commitment to your health goals. Please review the following plan we discussed and let me know if I can assist you in the future.   These are the goals we discussed: Goals    . Weight (lb) < 141 lb (64 kg)          Target weight is 140 lbs. Starting 05/10/2016, I will continue to monitor intake of white bread, white potatoes, and other simple carbs.        This is a list of the screening recommended for you and due dates:  Health Maintenance  Topic Date Due  . Mammogram  03/11/2017  . Colon Cancer Screening  08/11/2023  . Tetanus Vaccine  05/03/2024  . Flu Shot  Completed  . DEXA scan (bone density measurement)  Completed  . Shingles Vaccine  Completed  .  Hepatitis C: One time screening is recommended by Center for Disease Control  (CDC) for  adults born from [birth year] through 1966.   Completed  . Pneumonia vaccines  Completed   Preventive Care for Adults  A healthy lifestyle and preventive care can promote health and wellness. Preventive health guidelines for adults include the following key practices.  . A routine yearly physical is a good way to check with your health care provider about your health and preventive screening. It is a chance to share any concerns and updates on your health and to receive a thorough exam.  . Visit your dentist for a routine exam and preventive care every 6 months. Brush your teeth twice a day and floss once a day. Good oral hygiene prevents tooth decay and gum disease.  . The frequency of eye exams is based on your age, health, family medical history, use  of contact lenses, and other factors. Follow your health care provider's ecommendations for frequency of eye exams.  . Eat a healthy diet. Foods like vegetables, fruits, whole grains, low-fat dairy products, and lean protein foods contain the nutrients you need without too many calories.  Decrease your intake of foods high in solid fats, added sugars, and salt. Eat the right amount of calories for you. Get information about a proper diet from your health care provider, if necessary.  . Regular physical exercise is one of the most important things you can do for your health. Most adults should get at least 150 minutes of moderate-intensity exercise (any activity that increases your heart rate and causes you to sweat) each week. In addition, most adults need muscle-strengthening exercises on 2 or more days a week.  Silver Sneakers may be a benefit available to you. To determine eligibility, you may visit the website: www.silversneakers.com or contact program at (417) 050-0014 Mon-Fri between 8AM-8PM.   . Maintain a healthy weight. The body mass index (BMI) is a screening tool to identify possible weight problems. It provides an estimate of body fat based on height and weight. Your health care provider can find your BMI and can help you achieve or maintain a healthy weight.   For adults 20 years and older: ? A BMI below 18.5 is considered underweight. ? A BMI of 18.5 to 24.9 is normal. ? A BMI of 25 to 29.9 is considered overweight. ? A BMI of 30 and above is considered obese.   . Maintain normal blood lipids and cholesterol levels by exercising and minimizing your intake of saturated fat. Eat a balanced diet  with plenty of fruit and vegetables. Blood tests for lipids and cholesterol should begin at age 65 and be repeated every 5 years. If your lipid or cholesterol levels are high, you are over 50, or you are at high risk for heart disease, you may need your cholesterol levels checked more frequently. Ongoing high lipid and cholesterol levels should be treated with medicines if diet and exercise are not working.  . If you smoke, find out from your health care provider how to quit. If you do not use tobacco, please do not start.  . If you choose to drink alcohol, please do not consume  more than 2 drinks per day. One drink is considered to be 12 ounces (355 mL) of beer, 5 ounces (148 mL) of wine, or 1.5 ounces (44 mL) of liquor.  . If you are 101-35 years old, ask your health care provider if you should take aspirin to prevent strokes.  . Use sunscreen. Apply sunscreen liberally and repeatedly throughout the day. You should seek shade when your shadow is shorter than you. Protect yourself by wearing long sleeves, pants, a wide-brimmed hat, and sunglasses year round, whenever you are outdoors.  . Once a month, do a whole body skin exam, using a mirror to look at the skin on your back. Tell your health care provider of new moles, moles that have irregular borders, moles that are larger than a pencil eraser, or moles that have changed in shape or color.

## 2016-05-10 NOTE — Assessment & Plan Note (Signed)
Change to protonix 40 mg - due to ins cov Urged her to try qd before she moves to [place]

## 2016-05-10 NOTE — Progress Notes (Signed)
Subjective:    Patient ID: [name], female    DOB: 01-06-1951, 65 y.o.   MRN: [id number]  HPI Here for health maintenance exam and to review chronic medical problems    Feels fair  No time for self care  Mother is sick at home under hospice care She still needs to lift her -this is difficult   BP Readings from Last 3 Encounters:  05/10/16 116/70  05/10/16 116/70  05/05/16 132/86     Had AMW visit today  Hearing dec in 3000 Hz range and 4000 in L ear- she notices it /wants to put off an evaluation for now  Updated on vaccines-flu and pneumovax  No mammograms due to bilat mastectomy (10 % breast tissue left with implants)  Self breast exam -no lumps or changes   Colonoscopy 12/14-10 year recall   dexa 6/16-mild osteopenia Takes ca and D Is active  No falls or fractures    Wt Readings from Last 3 Encounters:  05/10/16 170 lb 8 oz (77.3 kg)  05/10/16 170 lb 8 oz (77.3 kg)  05/05/16 170 lb (77.1 kg)  stable weight  Eats healthy except for ice cream  bmi is 29.5  Hx of hyperglycemia Lab Results  Component Value Date   HGBA1C 6.7 (H) 05/05/2016  is in diabetic range  Needs to get serious about low sugar/carb diet    Cholesterol Lab Results  Component Value Date   CHOL 195 05/05/2016   CHOL 178 01/21/2015   CHOL 204 (H) 05/24/2013   Lab Results  Component Value Date   HDL 62.40 05/05/2016   HDL 58.80 01/21/2015   HDL 58.20 05/24/2013   Lab Results  Component Value Date   LDLCALC 102 (H) 05/05/2016   LDLCALC 82 01/21/2015   LDLCALC 73 09/23/2006   Lab Results  Component Value Date   TRIG 149.0 05/05/2016   TRIG 184.0 (H) 01/21/2015   TRIG 221.0 (H) 05/24/2013   Lab Results  Component Value Date   CHOLHDL 3 05/05/2016   CHOLHDL 3 01/21/2015   CHOLHDL 4 05/24/2013   Lab Results  Component Value Date   LDLDIRECT 119.0 05/24/2013   LDLDIRECT 117.3 02/24/2012   LDLDIRECT 124.5 03/12/2010   overall stable     Used to take nexium- then  bought otc  She wants to change to protonix- is covered   Patient Active Problem List   Diagnosis Date Noted  . Stress reaction 05/10/2016  . Elevated blood-pressure reading without diagnosis of hypertension 01/09/2016  . Encounter for Medicare annual wellness exam 01/29/2015  . Estrogen deficiency 01/29/2015  . Hyperglycemia 01/29/2015  . Menopausal disorder 05/03/2014  . Chest pain 05/22/2013  . Routine general medical examination at a health care facility 02/20/2012  . TRANSAMINASES, SERUM, ELEVATED 06/18/2010  . 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 DEPRESSION 06/20/2008  . GERD 06/20/2008   Past Medical History:  Diagnosis Date  . Depression   . Fatty liver   . Fibrocystic disease of both breasts   . GERD (gastroesophageal reflux disease)   . Sphincter of Oddi dysfunction    Past Surgical History:  Procedure Laterality Date  . CARPAL TUNNEL RELEASE  08/2008   left  . ERCP    . LEFT HEART CATHETERIZATION WITH CORONARY ANGIOGRAM N/A 06/08/2013   Procedure: LEFT HEART CATHETERIZATION WITH CORONARY ANGIOGRAM;  Surgeon: Ramond Dial, MD;  Location: [facility] CATH LAB;  Service: Cardiovascular;  Laterality: N/A;  . MASTECTOMY  bilateral for severe macrocystic breast   Social History  Substance Use Topics  . Smoking status: Never Smoker  . Smokeless tobacco: Never Used  . Alcohol use 0.0 oz/week     Comment: occ   Family History  Problem Relation Age of Onset  . Hypertension Father   . Cancer Maternal Aunt     Pancreatic  . Diabetes Maternal Uncle   . Heart disease Maternal Grandmother    Allergies  Allergen Reactions  . Atorvastatin     REACTION: rash  . Ezetimibe     REACTION: muscle aches  . Sulfamethoxazole-Trimethoprim Other (See Comments)    Swelling of nostrils   Current Outpatient Prescriptions on File Prior to Visit  Medication Sig Dispense Refill  . acetaminophen (TYLENOL) 500 MG tablet Take 500 mg by mouth at bedtime.     .  diclofenac sodium (VOLTAREN) 1 % GEL Apply 4 g topically 4 (four) times daily. 5 Tube 11  . doxylamine, Sleep, (UNISOM) 25 MG tablet Take 25 mg by mouth at bedtime as needed.    Marland Kitchen estradiol (ESTRACE VAGINAL) 0.1 MG/GM vaginal cream Place 1 g vaginally 2 (two) times a week. Takes twice weekly as needed for vaginal dryness 42.5 g 3  . estrogens, conjugated, (PREMARIN) 0.625 MG tablet Take 1/2 tablets daily by mouth 30 tablet 2  . nitroGLYCERIN (NITROSTAT) 0.4 MG SL tablet Place 1 tablet (0.4 mg total) under the tongue every 5 (five) minutes as needed for chest pain. 100 tablet 3  . traMADol (ULTRAM) 50 MG tablet Take 1 tablet (50 mg total) by mouth every 6 (six) hours as needed. 50 tablet 2   No current facility-administered medications on file prior to visit.     Review of Systems Review of Systems  Constitutional: Negative for fever, appetite change, fatigue and unexpected weight change.  Eyes: Negative for pain and visual disturbance.  Respiratory: Negative for cough and shortness of breath.   Cardiovascular: Negative for cp or palpitations    Gastrointestinal: Negative for nausea, diarrhea and constipation.  Genitourinary: Negative for urgency and frequency.  Skin: Negative for pallor or rash   Neurological: Negative for weakness, light-headedness, numbness and headaches.  Hematological: Negative for adenopathy. Does not bruise/bleed easily.  Psychiatric/Behavioral: Negative for dysphoric mood. Pos for anxiety and stressors          Objective:   Physical Exam  Constitutional: She appears well-developed and well-nourished. No distress.  Well appearing/ overwt   HENT:  Head: Normocephalic and atraumatic.  Right Ear: External ear normal.  Left Ear: External ear normal.  Mouth/Throat: Oropharynx is clear and moist.  Eyes: Conjunctivae and EOM are normal. Pupils are equal, round, and reactive to light. No scleral icterus.  Neck: Normal range of motion. Neck supple. No JVD present.  Carotid bruit is not present. No thyromegaly present.  Cardiovascular: Normal rate, regular rhythm, normal heart sounds and intact distal pulses.  Exam reveals no gallop.   Pulmonary/Chest: Effort normal and breath sounds normal. No respiratory distress. She has no wheezes. She exhibits no tenderness.  Abdominal: Soft. Bowel sounds are normal. She exhibits no distension, no abdominal bruit and no mass. There is no tenderness.  Genitourinary: No breast swelling, tenderness, discharge or bleeding.  Genitourinary Comments: Breast exam: No mass, nodules, thickening, tenderness, bulging, retraction, inflamation, nipple discharge or skin changes noted.  No axillary or clavicular LA.      Musculoskeletal: Normal range of motion. She exhibits no edema or tenderness.  Lymphadenopathy:  She has no cervical adenopathy.  Neurological: She is alert. She has normal reflexes. No cranial nerve deficit. She exhibits normal muscle tone. Coordination normal.  Skin: Skin is warm and dry. No rash noted. No erythema. No pallor.  Solar lentigines diffusely  Psychiatric: She has a normal mood and affect.          Assessment & Plan:   Problem List Items Addressed This Visit      Digestive   GERD    Change to protonix 40 mg - due to ins cov Urged her to try qd before she moves to [place]       Relevant Medications   pantoprazole (PROTONIX) 40 MG tablet     Other   Stress reaction    Caring for mother/hospice with brain tumor Not sleeping well Px xanax 0.5 for prn use -only when needed Warned of potential sedation and habit with this       Routine general medical examination at a health care facility - Primary    Reviewed health habits including diet and exercise and skin cancer prevention Reviewed appropriate screening tests for age  Also reviewed health mt list, fam hx and immunization status , as well as social and family history   See HPI Labs reviewed  AMW reviewed  Not interested in a formal  hearing exam yet Reviewed stressors/ coping techniques/symptoms/ support sources/ tx options and side effects in detail today  utd on imms  Rev low glycemic diet       Hyperglycemia    Now in DM range Lab Results  Component Value Date   HGBA1C 6.7 (H) 05/05/2016   Will give up sweets/daily ice cream Disc low glycemic diet  Also exercise program when able F/u 3 mo with A1C prior      Relevant Orders   Hemoglobin A1c    Other Visit Diagnoses   None.

## 2016-05-10 NOTE — Patient Instructions (Addendum)
I sent protonix to your pharmacy  For cholesterol Avoid red meat/ fried foods/ egg yolks/ fatty breakfast meats/ butter, cheese and high fat dairy/ and shellfish   For new diabetes - cut out sweets and cut back servings of all carbohydrates  Follow up in 3 months with labs prior for blood sugar  Use xanax for sleep and anxiety only when absolutely necessary

## 2016-05-10 NOTE — Assessment & Plan Note (Signed)
Reviewed health habits including diet and exercise and skin cancer prevention Reviewed appropriate screening tests for age  Also reviewed health mt list, fam hx and immunization status , as well as social and family history   See HPI Labs reviewed  AMW reviewed  Not interested in a formal hearing exam yet Reviewed stressors/ coping techniques/symptoms/ support sources/ tx options and side effects in detail today  utd on imms  Rev low glycemic diet

## 2016-05-10 NOTE — Progress Notes (Signed)
Subjective:   [name] is a 65 y.o. female who presents for Medicare Annual (Subsequent) preventive examination.  Review of Systems:  N/A Cardiac Risk Factors include: advanced age (>36men, >45 women)     Objective:     Vitals: BP 116/70 (BP Location: Left Arm, Patient Position: Sitting, Cuff Size: Normal)   Pulse 84   Temp 98.4 F (36.9 C) (Oral)   Ht 5' 3.75" (1.619 m) Comment: no shoes  Wt 170 lb 8 oz (77.3 kg)   SpO2 95%   BMI 29.50 kg/m   Body mass index is 29.5 kg/m.   Tobacco History  Smoking Status  . Never Smoker  Smokeless Tobacco  . Never Used     Counseling given: No   Past Medical History:  Diagnosis Date  . Depression   . Fatty liver   . Fibrocystic disease of both breasts   . GERD (gastroesophageal reflux disease)   . Sphincter of Oddi dysfunction    Past Surgical History:  Procedure Laterality Date  . CARPAL TUNNEL RELEASE  08/2008   left  . ERCP    . LEFT HEART CATHETERIZATION WITH CORONARY ANGIOGRAM N/A 06/08/2013   Procedure: LEFT HEART CATHETERIZATION WITH CORONARY ANGIOGRAM;  Surgeon: Ramond Dial, MD;  Location: [facility] CATH LAB;  Service: Cardiovascular;  Laterality: N/A;  . MASTECTOMY     bilateral for severe macrocystic breast   Family History  Problem Relation Age of Onset  . Hypertension Father   . Cancer Maternal Aunt     Pancreatic  . Diabetes Maternal Uncle   . Heart disease Maternal Grandmother    History  Sexual Activity  . Sexual activity: Yes  . Birth control/ protection: Surgical    Outpatient Encounter Prescriptions as of 05/10/2016  Medication Sig  . acetaminophen (TYLENOL) 500 MG tablet Take 500 mg by mouth at bedtime.   . diclofenac sodium (VOLTAREN) 1 % GEL Apply 4 g topically 4 (four) times daily.  Marland Kitchen doxylamine, Sleep, (UNISOM) 25 MG tablet Take 25 mg by mouth at bedtime as needed.  Marland Kitchen estradiol (ESTRACE VAGINAL) 0.1 MG/GM vaginal cream Place 1 g vaginally 2 (two) times a week. Takes twice weekly  as needed for vaginal dryness  . estrogens, conjugated, (PREMARIN) 0.625 MG tablet Take 1/2 tablets daily by mouth  . nitroGLYCERIN (NITROSTAT) 0.4 MG SL tablet Place 1 tablet (0.4 mg total) under the tongue every 5 (five) minutes as needed for chest pain.  . traMADol (ULTRAM) 50 MG tablet Take 1 tablet (50 mg total) by mouth every 6 (six) hours as needed.  . [DISCONTINUED] cyclobenzaprine (FLEXERIL) 10 MG tablet Take 1 tablet (10 mg total) by mouth daily as needed for muscle spasms.  . [DISCONTINUED] esomeprazole (NEXIUM) 40 MG capsule Take 1 capsule (40 mg total) by mouth 2 (two) times daily. (Patient taking differently: Take 40 mg by mouth 2 (two) times daily. OTC)   No facility-administered encounter medications on file as of 05/10/2016.     Activities of Daily Living In your present state of health, do you have any difficulty performing the following activities: 05/10/2016  Hearing? Y  Vision? N  Difficulty concentrating or making decisions? N  Walking or climbing stairs? N  Dressing or bathing? N  Doing errands, shopping? N  Preparing Food and eating ? N  Using the Toilet? N  In the past six months, have you accidently leaked urine? N  Do you have problems with loss of bowel control? N  Managing  your Medications? N  Managing your Finances? N  Housekeeping or managing your Housekeeping? N  Some recent data might be hidden    Patient Care Team: Abner Greenspan, MD as PCP - General Caren Macadam, MD as Consulting Physician (Obstetrics and Gynecology) Sharyne Peach, MD as Consulting Physician (Ophthalmology)    Assessment:     Hearing Screening   125Hz  250Hz  500Hz  1000Hz  2000Hz  3000Hz  4000Hz  6000Hz  8000Hz   Right ear:   40 40 40  40    Left ear:   40 40 40  0    Vision Screening Comments: Last exam in June 2017 with Dr. Delman Cheadle   Exercise Activities and Dietary recommendations Current Exercise Habits: The patient does not participate in regular exercise at present (pt  does yard work and house work), Exercise limited by: None identified  Goals    . Weight (lb) < 141 lb (64 kg)          Target weight is 140 lbs. Starting 05/10/2016, I will continue to monitor intake of white bread, white potatoes, and other simple carbs.       Fall Risk Fall Risk  05/10/2016 01/29/2015  Falls in the past year? No No   Depression Screen PHQ 2/9 Scores 05/10/2016 01/29/2015  PHQ - 2 Score 0 0     Cognitive Testing MMSE - Mini Mental State Exam 05/10/2016  Orientation to time 5  Orientation to Place 5  Registration 3  Attention/ Calculation 0  Recall 3  Language- name 2 objects 0  Language- repeat 1  Language- follow 3 step command 3  Language- read & follow direction 0  Write a sentence 0  Copy design 0  Total score 20   PLEASE NOTE: A Mini-Cog screen was completed. Maximum score is 20. A value of 0 denotes this part of Folstein MMSE was not completed or the patient failed this part of the Mini-Cog screening.   Mini-Cog Screening Orientation to Time - Max 5 pts Orientation to Place - Max 5 pts Registration - Max 3 pts Recall - Max 3 pts Language Repeat - Max 1 pts Language Follow 3 Step Command - Max 3 pts  Immunization History  Administered Date(s) Administered  . Influenza Whole 05/30/2008  . Influenza,inj,Quad PF,36+ Mos 05/03/2014, 06/06/2015, 05/10/2016  . Pneumococcal Conjugate-13 01/29/2015  . Pneumococcal Polysaccharide-23 05/10/2016  . Td 08/30/2002  . Tdap 05/03/2014  . Zoster 09/07/2012   Screening Tests Health Maintenance  Topic Date Due  . MAMMOGRAM  03/11/2017  . COLONOSCOPY  08/11/2023  . TETANUS/TDAP  05/03/2024  . INFLUENZA VACCINE  Completed  . DEXA SCAN  Completed  . ZOSTAVAX  Completed  . Hepatitis C Screening  Completed  . PNA vac Low Risk Adult  Completed      Plan:     I have personally reviewed and addressed the Medicare Annual Wellness questionnaire and have noted the following in the patient's chart:  A. Medical  and social history B. Use of alcohol, tobacco or illicit drugs  C. Current medications and supplements D. Functional ability and status E.  Nutritional status F.  Physical activity G. Advance directives H. List of other physicians I.  Hospitalizations, surgeries, and ER visits in previous 12 months J.  [place] to include hearing, vision, cognitive, depression L. Referrals and appointments - none  In addition, I have reviewed and discussed with patient certain preventive protocols, quality metrics, and best practice recommendations. A written personalized care plan for preventive services  as well as general preventive health recommendations were provided to patient.  See attached scanned questionnaire for additional information.   Signed,   Lindell Noe, MHA, BS, LPN Health Advisor

## 2016-05-10 NOTE — Assessment & Plan Note (Signed)
Caring for mother/hospice with brain tumor Not sleeping well Px xanax 0.5 for prn use -only when needed Warned of potential sedation and habit with this

## 2016-05-10 NOTE — Progress Notes (Signed)
Pre visit review using our clinic review tool, if applicable. No additional management support is needed unless otherwise documented below in the visit note. 

## 2016-05-10 NOTE — Assessment & Plan Note (Signed)
Now in DM range Lab Results  Component Value Date   HGBA1C 6.7 (H) 05/05/2016   Will give up sweets/daily ice cream Disc low glycemic diet  Also exercise program when able F/u 3 mo with A1C prior

## 2016-05-10 NOTE — Progress Notes (Signed)
PCP notes:   Health maintenance:  Flu vaccine - administered PCV13 - administered  Abnormal screenings:   Hearing - failed  Patient concerns:   Pt verbalized feelings of being overwhelmed due being a caregiver for ailing mother.    Nurse concerns:  None  Next PCP appt:   05/10/2016 @ 1430  I reviewed health advisor's note, was available for consultation, and agree with documentation and plan. Seen today by me as well Issues addressed Loura Pardon MD

## 2016-05-14 DIAGNOSIS — S0033XA Contusion of nose, initial encounter: Secondary | ICD-10-CM | POA: Diagnosis not present

## 2016-05-14 DIAGNOSIS — S20211A Contusion of right front wall of thorax, initial encounter: Secondary | ICD-10-CM | POA: Diagnosis not present

## 2016-05-14 DIAGNOSIS — R079 Chest pain, unspecified: Secondary | ICD-10-CM | POA: Diagnosis not present

## 2016-05-14 DIAGNOSIS — W091XXA Fall from playground swing, initial encounter: Secondary | ICD-10-CM | POA: Diagnosis not present

## 2016-05-14 DIAGNOSIS — S20212A Contusion of left front wall of thorax, initial encounter: Secondary | ICD-10-CM | POA: Diagnosis not present

## 2016-05-14 DIAGNOSIS — S0993XA Unspecified injury of face, initial encounter: Secondary | ICD-10-CM | POA: Diagnosis not present

## 2016-05-14 DIAGNOSIS — S298XXA Other specified injuries of thorax, initial encounter: Secondary | ICD-10-CM | POA: Diagnosis not present

## 2016-06-02 ENCOUNTER — Ambulatory Visit (INDEPENDENT_AMBULATORY_CARE_PROVIDER_SITE_OTHER): Payer: Medicare Other | Admitting: Family Medicine

## 2016-06-02 ENCOUNTER — Encounter: Payer: Medicare Other | Admitting: Family Medicine

## 2016-06-02 ENCOUNTER — Encounter: Payer: Self-pay | Admitting: Family Medicine

## 2016-06-02 VITALS — BP 120/74 | HR 80 | Ht 63.75 in | Wt 170.0 lb

## 2016-06-02 DIAGNOSIS — Z90710 Acquired absence of both cervix and uterus: Secondary | ICD-10-CM

## 2016-06-02 DIAGNOSIS — E119 Type 2 diabetes mellitus without complications: Secondary | ICD-10-CM | POA: Insufficient documentation

## 2016-06-02 DIAGNOSIS — R7303 Prediabetes: Secondary | ICD-10-CM | POA: Insufficient documentation

## 2016-06-02 DIAGNOSIS — N959 Unspecified menopausal and perimenopausal disorder: Secondary | ICD-10-CM | POA: Diagnosis not present

## 2016-06-02 NOTE — Progress Notes (Signed)
   CLINIC ENCOUNTER NOTE  History:  64 y.o. [id number] here today for follow up on here HRT taper previously prescribed by Dr. Ubaldo Glassing She denies any abnormal vaginal discharge, bleeding, pelvic pain or other concerns.   Doing well on on 0.3125mg  daily (1/2, 0.625 tablets). Reports improved hot flashes on new dose.  Having some trouble sleeping for which her PCP prescribed xanax. She is also caring for her elderly mother who is on hospice.  She was also recently diagnosed with T2DM by PCP  Past Medical History:  Diagnosis Date  . Depression   . Fatty liver   . Fibrocystic disease of both breasts   . GERD (gastroesophageal reflux disease)   . Sphincter of Oddi dysfunction     Past Surgical History:  Procedure Laterality Date  . CARPAL TUNNEL RELEASE  08/2008   left  . ERCP    . LEFT HEART CATHETERIZATION WITH CORONARY ANGIOGRAM N/A 06/08/2013   Procedure: LEFT HEART CATHETERIZATION WITH CORONARY ANGIOGRAM;  Surgeon: Ramond Dial, MD;  Location: [facility] CATH LAB;  Service: Cardiovascular;  Laterality: N/A;  . MASTECTOMY     bilateral for severe macrocystic breast    The following portions of the patient's history were reviewed and updated as appropriate: allergies, current medications, past family history, past medical history, past social history, past surgical history and problem list.   Health Maintenance:  Does not need pap smears due to TAH.  Normal mammogram on 2016.   Review of Systems:  Pertinent items noted in HPI and remainder of comprehensive ROS otherwise negative.  Objective:  Physical Exam BP 120/74   Pulse 80   Ht 5' 3.75" (1.619 m)   Wt 170 lb (77.1 kg)   BMI 29.41 kg/m  CONSTITUTIONAL: Well-developed, well-nourished female in no acute distress.  HENT:  Normocephalic, atraumatic. EYES:  No scleral icterus.  NECK: Normal range of motion, supple, SKIN: Skin is warm and dry. No rash noted. Not diaphoretic. No erythema. No pallor. [place]: Alert and oriented  to person, place, and time. Normal reflexes, muscle tone coordination. No cranial nerve deficit noted. PSYCHIATRIC: Normal mood and affect. Normal behavior. Normal judgment and thought content. CARDIOVASCULAR: Normal heart rate noted RESPIRATORY: normal WOB ABDOMEN: Soft, no distention noted.   PELVIC: Deferred MUSCULOSKELETAL: Normal range of motion. No edema noted.  Labs and Imaging No results found.  Assessment & Plan:  1. Postmenopausal -Patient on continuous HRT since ~1980.  In the process of gradual taper off systemic estrogen -Patient will stay at 0.3125mg  daily estrogen, plan to decrease again in 2 months to 0.21mg  (1/2 0.42mg  tablets.  - If complaining of hot flashes, consider Effexor for menopausal sx controle -  Continue estradiol (ESTRACE VAGINAL) 0.1 MG/GM vaginal cream   Routine preventative health maintenance measures emphasized. Please refer to After Visit Summary for other counseling recommendations.   Return in about 8 weeks (around 07/28/2016) for follow up HRT.  Total face-to-face time with patient: 52minutes. Over 50% of encounter was spent on counseling and coordination of care.

## 2016-06-07 ENCOUNTER — Telehealth: Payer: Self-pay | Admitting: Family Medicine

## 2016-06-07 ENCOUNTER — Other Ambulatory Visit: Payer: Self-pay | Admitting: Family Medicine

## 2016-06-07 ENCOUNTER — Encounter: Payer: Self-pay | Admitting: Family Medicine

## 2016-06-07 MED ORDER — ALPRAZOLAM 0.5 MG PO TABS: 0.5000 mg | ORAL_TABLET | Freq: Every evening | ORAL | 3 refills | Status: DC | PRN

## 2016-06-07 NOTE — Telephone Encounter (Signed)
Rx called in as prescribed 

## 2016-06-07 NOTE — Telephone Encounter (Signed)
Pt req xanax refill- I think we did it already

## 2016-06-07 NOTE — Telephone Encounter (Signed)
Px written for call in   

## 2016-06-07 NOTE — Telephone Encounter (Addendum)
Last filled 05/10/16 by Baity--please advise if you are okay with refill as you are PCP

## 2016-07-27 ENCOUNTER — Ambulatory Visit: Payer: Medicare Other | Admitting: Family Medicine

## 2016-08-04 ENCOUNTER — Other Ambulatory Visit (INDEPENDENT_AMBULATORY_CARE_PROVIDER_SITE_OTHER): Payer: Medicare Other

## 2016-08-04 DIAGNOSIS — R739 Hyperglycemia, unspecified: Secondary | ICD-10-CM

## 2016-08-04 LAB — HEMOGLOBIN A1C: Hgb A1c MFr Bld: 6 % (ref 4.6–6.5)

## 2016-08-09 ENCOUNTER — Encounter: Payer: Self-pay | Admitting: Family Medicine

## 2016-08-09 ENCOUNTER — Other Ambulatory Visit: Payer: Self-pay | Admitting: Family Medicine

## 2016-08-09 ENCOUNTER — Ambulatory Visit (INDEPENDENT_AMBULATORY_CARE_PROVIDER_SITE_OTHER): Payer: Medicare Other | Admitting: Family Medicine

## 2016-08-09 VITALS — BP 118/70 | HR 87 | Temp 98.2°F | Ht 63.75 in | Wt 159.0 lb

## 2016-08-09 DIAGNOSIS — F430 Acute stress reaction: Secondary | ICD-10-CM

## 2016-08-09 DIAGNOSIS — R739 Hyperglycemia, unspecified: Secondary | ICD-10-CM

## 2016-08-09 NOTE — Progress Notes (Signed)
Subjective:    Patient ID: [name], female    DOB: Aug 04, 1951, 65 y.o.   MRN: [id number]  HPI Here for f/u of chronic medical problems   Wt Readings from Last 3 Encounters:  08/09/16 159 lb (72.1 kg)  06/02/16 170 lb (77.1 kg)  05/10/16 170 lb 8 oz (77.3 kg)  significant weight loss - pleased with that  Not a lot of time for exercise and self care - but a lot of housework (that counts)  bmi is 27.5  DM2/hyperglycemia Lab Results  Component Value Date   HGBA1C 6.0 08/04/2016  cut way back on simple /white carbohydrates  This is down from 6.7- improved!    Chemistry      Component Value Date/Time   NA 139 05/05/2016 0833   K 4.2 05/05/2016 0833   CL 101 05/05/2016 0833   CO2 29 05/05/2016 0833   BUN 14 05/05/2016 0833   CREATININE 0.61 05/05/2016 0833      Component Value Date/Time   CALCIUM 9.4 05/05/2016 0833   ALKPHOS 52 05/05/2016 0833   AST 18 05/05/2016 0833   ALT 29 05/05/2016 0833   BILITOT 0.7 05/05/2016 0833       Last visit given xanax to help sleep and caregiver stress- it really helps  Mother req 24 hour care (brain tumor)  Very difficult  She is getting more irritable  In hospice  Patient Active Problem List   Diagnosis Date Noted  . Hyperglycemia 06/02/2016  . Stress reaction 05/10/2016  . Estrogen deficiency 01/29/2015  . Menopausal disorder 05/03/2014  . Chest pain 05/22/2013  . TRANSAMINASES, SERUM, ELEVATED 06/18/2010  . 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 DEPRESSION 06/20/2008  . GERD 06/20/2008  . 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   Past Medical History:  Diagnosis Date  . Depression   . Fatty liver   . Fibrocystic disease of both breasts   . GERD (gastroesophageal reflux disease)   . Sphincter of Oddi dysfunction    Past Surgical History:  Procedure Laterality Date  . CARPAL TUNNEL RELEASE  08/2008   left  . ERCP    . LEFT HEART CATHETERIZATION WITH CORONARY ANGIOGRAM N/A 06/08/2013     Procedure: LEFT HEART CATHETERIZATION WITH CORONARY ANGIOGRAM;  Surgeon: Ramond Dial, MD;  Location: [facility] CATH LAB;  Service: Cardiovascular;  Laterality: N/A;  . MASTECTOMY     bilateral for severe macrocystic breast   Social History  Substance Use Topics  . Smoking status: Never Smoker  . Smokeless tobacco: Never Used  . Alcohol use 0.0 oz/week     Comment: occ   Family History  Problem Relation Age of Onset  . Hypertension Father   . Cancer Maternal Aunt     Pancreatic  . Diabetes Maternal Uncle   . Heart disease Maternal Grandmother   . Brain cancer Mother     hospice   Allergies  Allergen Reactions  . Atorvastatin     REACTION: rash  . Ezetimibe     REACTION: muscle aches  . Sulfamethoxazole-Trimethoprim Other (See Comments)    Swelling of nostrils   Current Outpatient Prescriptions on File Prior to Visit  Medication Sig Dispense Refill  . acetaminophen (TYLENOL) 500 MG tablet Take 500 mg by mouth at bedtime.     . ALPRAZolam (XANAX) 0.5 MG tablet Take 1 tablet (0.5 mg total) by mouth at bedtime as needed for anxiety or sleep. 30 tablet 3  . cyclobenzaprine (  FLEXERIL) 10 MG tablet Take 1 tablet (10 mg total) by mouth daily as needed for muscle spasms. 90 tablet 0  . diclofenac sodium (VOLTAREN) 1 % GEL Apply 4 g topically 4 (four) times daily. 5 Tube 11  . doxylamine, Sleep, (UNISOM) 25 MG tablet Take 25 mg by mouth at bedtime as needed.    Marland Kitchen estradiol (ESTRACE VAGINAL) 0.1 MG/GM vaginal cream Place 1 g vaginally 2 (two) times a week. Takes twice weekly as needed for vaginal dryness 42.5 g 3  . estrogens, conjugated, (PREMARIN) 0.625 MG tablet Take 1/2 tablets daily by mouth 30 tablet 2  . HYDROcodone-acetaminophen (NORCO/VICODIN) 5-325 MG tablet     . nitroGLYCERIN (NITROSTAT) 0.4 MG SL tablet Place 1 tablet (0.4 mg total) under the tongue every 5 (five) minutes as needed for chest pain. 100 tablet 3  . pantoprazole (PROTONIX) 40 MG tablet Take 1 tablet (40 mg  total) by mouth 2 (two) times daily. 180 tablet 3  . traMADol (ULTRAM) 50 MG tablet Take 1 tablet (50 mg total) by mouth every 6 (six) hours as needed. 50 tablet 2   No current facility-administered medications on file prior to visit.      Review of Systems Review of Systems  Constitutional: Negative for fever, appetite change, fatigue and unexpected weight change.  Eyes: Negative for pain and visual disturbance.  Respiratory: Negative for cough and shortness of breath.   Cardiovascular: Negative for cp or palpitations    Gastrointestinal: Negative for nausea, diarrhea and constipation.  Genitourinary: Negative for urgency and frequency.  Skin: Negative for pallor or rash   Neurological: Negative for weakness, light-headedness, numbness and headaches.  Hematological: Negative for adenopathy. Does not bruise/bleed easily.  Psychiatric/Behavioral: Negative for dysphoric mood. Pos for some anxiety from caregiver stress         Objective:   Physical Exam  Constitutional: She appears well-developed and well-nourished. No distress.  Well appearing   HENT:  Head: Normocephalic and atraumatic.  Mouth/Throat: Oropharynx is clear and moist.  Eyes: Conjunctivae and EOM are normal. Pupils are equal, round, and reactive to light.  Neck: Normal range of motion. Neck supple. No JVD present. Carotid bruit is not present. No thyromegaly present.  Cardiovascular: Normal rate, regular rhythm, normal heart sounds and intact distal pulses.  Exam reveals no gallop.   Pulmonary/Chest: Effort normal and breath sounds normal. No respiratory distress. She has no wheezes. She has no rales.  No crackles  Abdominal: Soft. Bowel sounds are normal. She exhibits no distension, no abdominal bruit and no mass. There is no tenderness.  Musculoskeletal: She exhibits no edema.  Lymphadenopathy:    She has no cervical adenopathy.  Neurological: She is alert. She has normal reflexes.  Skin: Skin is warm and dry. No  rash noted.  Psychiatric: She has a normal mood and affect.  Pleasant and talkative Not anxious today          Assessment & Plan:   Problem List Items Addressed This Visit      Other   Stress reaction    Care of mother with brain tumor is more difficult but xanax is greatly helping her sleep  Reviewed stressors/ coping techniques/symptoms/ support sources/ tx options and side effects in detail today Disc imp of self care and exercise when able  Does not desire counseling at this time       Hyperglycemia - Primary    Wt is down significantly and A1C is down from 6.7 to 6.0  Enc pt to keep up the good work with low glycemic diet Add exercise when able  F/u next fall for annual exam  Commended!

## 2016-08-09 NOTE — Assessment & Plan Note (Signed)
Wt is down significantly and A1C is down from 6.7 to 6.0  Enc pt to keep up the good work with low glycemic diet Add exercise when able  F/u next fall for annual exam  Commended!

## 2016-08-09 NOTE — Patient Instructions (Addendum)
Great job with healthy diet and weight loss Keep it up  Add exercise when you get a chance to  Continue xanax as needed   Follow up after 05/10/17 for annual exam

## 2016-08-09 NOTE — Assessment & Plan Note (Signed)
Care of mother with brain tumor is more difficult but xanax is greatly helping her sleep  Reviewed stressors/ coping techniques/symptoms/ support sources/ tx options and side effects in detail today Disc imp of self care and exercise when able  Does not desire counseling at this time

## 2016-08-09 NOTE — Progress Notes (Signed)
Pre visit review using our clinic review tool, if applicable. No additional management support is needed unless otherwise documented below in the visit note. 

## 2016-08-10 NOTE — Telephone Encounter (Signed)
Will refill electronically  

## 2016-08-10 NOTE — Telephone Encounter (Signed)
Last OV was 08/09/16, last filled on 05/10/16 #90 with 0 refills, please advise

## 2016-08-11 ENCOUNTER — Encounter: Payer: Self-pay | Admitting: Family Medicine

## 2016-08-11 ENCOUNTER — Ambulatory Visit (INDEPENDENT_AMBULATORY_CARE_PROVIDER_SITE_OTHER): Payer: Medicare Other | Admitting: Family Medicine

## 2016-08-11 VITALS — BP 132/80 | HR 79 | Ht 63.75 in | Wt 160.0 lb

## 2016-08-11 DIAGNOSIS — Z90710 Acquired absence of both cervix and uterus: Secondary | ICD-10-CM

## 2016-08-11 DIAGNOSIS — N959 Unspecified menopausal and perimenopausal disorder: Secondary | ICD-10-CM | POA: Diagnosis not present

## 2016-08-11 MED ORDER — ESTROGENS CONJUGATED 0.45 MG PO TABS: 0.4500 mg | ORAL_TABLET | Freq: Every day | ORAL | 1 refills | Status: DC

## 2016-08-11 NOTE — Progress Notes (Signed)
   CLINIC ENCOUNTER NOTE  History:  65 y.o. [id number] here today for follow up on here HRT taper previously prescribed by Dr. Ubaldo Glassing She denies any abnormal vaginal discharge, bleeding, pelvic pain or other concerns.   Doing well on on 0.3125mg  daily (1/2, 0.625 tablets). No hot flashes currently and feeling well. She is also caring for her elderly mother who is on hospice.  She was also recently diagnosed with T2DM by PCP  Past Medical History:  Diagnosis Date  . Depression   . Fatty liver   . Fibrocystic disease of both breasts   . GERD (gastroesophageal reflux disease)   . Hyperglycemia   . Sphincter of Oddi dysfunction     Past Surgical History:  Procedure Laterality Date  . CARPAL TUNNEL RELEASE  08/2008   left  . ERCP    . LEFT HEART CATHETERIZATION WITH CORONARY ANGIOGRAM N/A 06/08/2013   Procedure: LEFT HEART CATHETERIZATION WITH CORONARY ANGIOGRAM;  Surgeon: Ramond Dial, MD;  Location[facility] CATH LAB;  Service: Cardiovascular;  Laterality: N/A;  . MASTECTOMY     bilateral for severe macrocystic breast    The following portions of the patient's history were reviewed and updated as appropriate: allergies, current medications, past family history, past medical history, past social history, past surgical history and problem list.   Health Maintenance:  Does not need pap smears due to TAH.  Normal mammogram on 2016.   Review of Systems:  Pertinent items noted in HPI and remainder of comprehensive ROS otherwise negative.  Objective:  Physical Exam BP 132/80 (BP Location: Left Arm, Patient Position: Sitting)   Pulse 79   Ht 5' 3.75" (1.619 m)   Wt 160 lb (72.6 kg)   BMI 27.68 kg/m  CONSTITUTIONAL: Well-developed, well-nourished female in no acute distress.  HENT:  Normocephalic, atraumatic. EYES:  No scleral icterus.  NECK: Normal range of motion, supple, SKIN: Skin is warm and dry. No rash noted. Not diaphoretic. No erythema. No pallor. [place]: Alert and  oriented to person, place, and time. Normal reflexes, muscle tone coordination. No cranial nerve deficit noted. PSYCHIATRIC: Normal mood and affect. Normal behavior. Normal judgment and thought content. CARDIOVASCULAR: Normal heart rate noted RESPIRATORY: normal WOB ABDOMEN: Soft, no distention noted.   PELVIC: Deferred MUSCULOSKELETAL: Normal range of motion. No edema noted.  Labs and Imaging No results found.  Assessment & Plan:  1. Postmenopausal - Patient on continuous HRT since ~1980.  In the process of gradual taper off systemic estrogen - Decrease again in 2 months to 0.22mg  (1/2 0.45mg  tablets). Cost continues to be an issue and the pharmacy did not receive the POA despite that fact that our office filled it out.  - If complaining of hot flashes, consider Effexor for menopausal sx controlled -  Continue estradiol (ESTRACE VAGINAL) 0.1 MG/GM vaginal cream   Routine preventative health maintenance measures emphasized. Please refer to After Visit Summary for other counseling recommendations.   Return in about 2 months (around 10/12/2016) for Follow up HRT.  Total face-to-face time with patient: 15 minutes. Over 50% of encounter was spent on counseling and coordination of care.

## 2016-08-11 NOTE — Patient Instructions (Signed)
Menopause and Hormone Replacement Therapy Introduction WHAT IS HORMONE REPLACEMENT THERAPY? Hormone replacement therapy (HRT) is the use of artificial (synthetic) hormones to replace hormones that your body stops producing during menopause. Menopause is the normal time of life when menstrual periods stop completely and the ovaries stop producing the female hormones estrogen and progesterone. This lack of hormones can affect your health and cause undesirable symptoms. HRT can relieve some of those symptoms. WHAT ARE MY OPTIONS FOR HRT? HRT may consist of the synthetic hormones estrogen and progestin, or it may consist of only estrogen (estrogen-only therapy). You and your health care provider will decide which form of HRT is best for you. If you choose to be on HRT and you have a uterus, estrogen and progestin are usually prescribed. Estrogen-only therapy is used for women who do not have a uterus. Possible options for taking HRT include:  Pills.  Patches.  Gels.  Sprays.  Vaginal cream.  Vaginal rings.  Vaginal inserts. The amount of hormone(s) that you take and how long you take the hormone(s) varies depending on your individual health. It is important to:  Begin HRT with the lowest possible dosage.  Stop HRT as soon as your health care provider tells you to stop.  Work with your health care provider so that you feel informed and comfortable with your decisions. WHAT ARE THE BENEFITS OF HRT? HRT can reduce the frequency and severity of menopausal symptoms. Benefits of HRT vary depending on the menopausal symptoms that you have, the severity of your symptoms, and your overall health. HRT may help to improve the following menopausal symptoms:  Hot flashes and night sweats. These are sudden feelings of heat that spread over the face and body. The skin may turn red, like a blush. Night sweats are hot flashes that happen while you are sleeping or trying to sleep.  Bone loss  (osteoporosis). The body loses calcium more quickly after menopause, causing the bones to become weaker. This can increase the risk for bone breaks (fractures).  Vaginal dryness. The lining of the vagina can become thin and dry, which can cause pain during sexual intercourse or cause infection, burning, or itching.  Urinary tract infections.  Urinary incontinence. This is a decreased ability to control when you urinate.  Irritability.  Short-term memory problems. WHAT ARE THE RISKS OF HRT? Risks of HRT vary depending on your individual health and medical history. Risks of HRT also depend on whether you receive both estrogen and progestin or you receive estrogen only.HRT may increase the risk of:  Spotting. This is when a small amount of bloodleaks from the vagina unexpectedly.  Endometrial cancer. This cancer is in the lining of the uterus (endometrium).  Breast cancer.  Increased density of breast tissue. This can make it harder to find breast cancer on a breast X-ray (mammogram).  Stroke.  Heart attack.  Blood clots.  Gallbladder disease. Risks of HRT can increase if you have any of the following conditions:  Endometrial cancer.  Liver disease.  Heart disease.  Breast cancer.  History of blood clots.  History of stroke. HOW SHOULD I CARE FOR MYSELF WHILE I AM ON HRT?  Take over-the-counter and prescription medicines only as told by your health care provider.  Get mammograms, pelvic exams, and medical checkups as often as told by your health care provider.  Have Pap tests done as often as told by your health care provider. A Pap test is sometimes called a Pap smear. It is a  screening test that is used to check for signs of cancer of the cervix and vagina. A Pap test can also identify the presence of infection or precancerous changes. Pap tests may be done:  Every 3 years, starting at age 29.  Every 5 years, starting after age 59, in combination with testing for  human papillomavirus (HPV).  More often or less often depending on other medical conditions you have, your age, and other risk factors.  It is your responsibility to get your Pap test results. Ask your health care provider or the department performing the test when your results will be ready.  Keep all follow-up visits as told by your health care provider. This is important. WHEN SHOULD I SEEK MEDICAL CARE? Talk with your health care provider if:  You have any of these:  Pain or swelling in your legs.  Shortness of breath.  Chest pain.  Lumps or changes in your breasts or armpits.  Slurred speech.  Pain, burning, or bleeding when you urine.  You develop any of these:  Unusual vaginal bleeding.  Dizziness or headaches.  Weakness or numbness in any part of your arms or legs.  Pain in your abdomen. This information is not intended to replace advice given to you by your health care provider. Make sure you discuss any questions you have with your health care provider. Document Released: 05/15/2003 Document Revised: 01/22/2016 Document Reviewed: 02/17/2015  2017 Elsevier

## 2016-08-13 ENCOUNTER — Telehealth: Payer: Self-pay | Admitting: *Deleted

## 2016-08-13 NOTE — Telephone Encounter (Signed)
-----   Message from [place] sent at 08/12/2016  3:52 PM EST ----- Regarding: Rx Advise Contact: [phone] Call about her primrin dosage, she states it is different than what she is use to taking and she was under the impression that she was suppose to be cutting back (to decrease the dosgae)

## 2016-08-13 NOTE — Telephone Encounter (Signed)
Spoke to pt, verified that she was to start taking 1/2 tablet of the Premarin 0.45mg  tablets to taper down which were sent to the pharmacy.  Pt acknowledged instructions.

## 2016-09-18 ENCOUNTER — Other Ambulatory Visit: Payer: Self-pay | Admitting: Family Medicine

## 2016-09-18 NOTE — Telephone Encounter (Signed)
I think she originally got it for knee pain from Dr Lorelei Pont.  How is her knee pain?

## 2016-09-18 NOTE — Telephone Encounter (Signed)
Last office visit 08/09/2016.  Last refilled 03/03/2016 for #50 with 2 refills by Dr. Lorelei Pont,  Ok to refill?

## 2016-09-20 NOTE — Telephone Encounter (Signed)
Pt said her know still bothers her some days but it depends on her activity level, pt said she doesn't take it to often but when she "overdoes it" she may need it for a few days

## 2016-09-20 NOTE — Telephone Encounter (Signed)
Px written for call in   Thanks  

## 2016-09-20 NOTE — Telephone Encounter (Signed)
Rx called in as prescribed 

## 2016-10-20 ENCOUNTER — Ambulatory Visit: Payer: Medicare Other | Admitting: Obstetrics and Gynecology

## 2016-10-25 ENCOUNTER — Other Ambulatory Visit: Payer: Self-pay | Admitting: Family Medicine

## 2016-10-25 NOTE — Telephone Encounter (Signed)
Pt had f/u on 08/09/16 last filled on 06/07/16 #30 tabs with 3 additional refills, please advise

## 2016-10-25 NOTE — Telephone Encounter (Signed)
Rx called in as prescribed 

## 2016-10-25 NOTE — Telephone Encounter (Signed)
Px written for call in   

## 2016-10-27 ENCOUNTER — Encounter: Payer: Self-pay | Admitting: Obstetrics and Gynecology

## 2016-10-27 ENCOUNTER — Ambulatory Visit (INDEPENDENT_AMBULATORY_CARE_PROVIDER_SITE_OTHER): Payer: Medicare Other | Admitting: Obstetrics and Gynecology

## 2016-10-27 VITALS — BP 104/67 | HR 85 | Resp 18 | Ht 64.5 in | Wt 158.0 lb

## 2016-10-27 DIAGNOSIS — Z79890 Hormone replacement therapy (postmenopausal): Secondary | ICD-10-CM | POA: Diagnosis not present

## 2016-10-27 MED ORDER — ESTROGENS, CONJUGATED 0.625 MG/GM VA CREA: TOPICAL_CREAM | VAGINAL | 12 refills | Status: DC

## 2016-10-27 MED ORDER — GABAPENTIN 300 MG PO CAPS: 300.0000 mg | ORAL_CAPSULE | Freq: Two times a day (BID) | ORAL | 11 refills | Status: DC

## 2016-10-27 NOTE — Progress Notes (Signed)
Obstetrics and Gynecology Visit Established Patient Evaluation  Appointment Date: 10/27/2016  OBGYN Clinic: [facility]  Primary Care Provider: [facility]  Chief Complaint: HRT follow up History of Present Illness: [name] is a 66 y.o. Caucasian [id number] (No LMP recorded. Patient has had a hysterectomy.), seen for the above chief complaint.   Patient s/p TAH/USO and has been on HRT since her 44s. Patient started topical HRT, in addition to her PO HRT, about 10 years ago. Her primary GYN was Dr. Ubaldo Glassing and she had been seeing Dr. Ernestina Patches most recently and tapering down off the PO HRT.  She currently takes premarin 0.2 qday and hasn't really been using the estrace cream b/c it causes some vaginal irritation. She does endorse hot flashes and was using the cream to help with vaginal dryness; she is using lubrication too.   Review of Systems:  as noted in the History of Present Illness.  Past Medical History:  Past Medical History:  Diagnosis Date  . Depression   . Fatty liver   . Fibrocystic disease of both breasts   . GERD (gastroesophageal reflux disease)   . Hyperglycemia   . 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 Sphincter of Oddi dysfunction     Past Surgical History:  Past Surgical History:  Procedure Laterality Date  . CARPAL TUNNEL RELEASE  08/2008   left  . ERCP    . LEFT HEART CATHETERIZATION WITH CORONARY ANGIOGRAM N/A 06/08/2013   Procedure: LEFT HEART CATHETERIZATION WITH CORONARY ANGIOGRAM;  Surgeon: Ramond Dial, MD;  Location: [facility] CATH LAB;  Service: Cardiovascular;  Laterality: N/A;  . MASTECTOMY     bilateral for severe macrocystic breast    Past Obstetrical History:  OB History  Gravida Para Term Preterm AB Living  3 0 0   1 2  SAB TAB Ectopic Multiple Live Births      1   2    # Outcome Date GA Lbr Len/2nd Weight Sex Delivery Anes PTL Lv  3 Ectopic 1980          2  Gravida 05/14/75    F Vag-Spont   LIV  1 Gravida 03/17/73    M Vag-Spont   LIV      Past Gynecological History: As per HPI. Patient denies h/o abnormal paps and states she had total hyst  Social History:  Social History   Social History  . Marital status: Married    Spouse name: N/A  . Number of children: N/A  . Years of education: N/A   Occupational History  . Not on file.   Social History Main Topics  . Smoking status: Never Smoker  . Smokeless tobacco: Never Used  . Alcohol use 0.0 oz/week     Comment: occ  . Drug use: No  . Sexual activity: Yes    Birth control/ protection: Surgical   Other Topics Concern  . Not on file   Social History Narrative  . No narrative on file    Family History:  Family History  Problem Relation Age of Onset  . Hypertension Father   . Cancer Maternal Aunt     Pancreatic  . Diabetes Maternal Uncle   . Heart disease Maternal Grandmother   . Brain cancer Mother     hospice    Medications Ms. Yi had no medications administered during this visit. Current Outpatient Prescriptions  Medication Sig Dispense Refill  .  acetaminophen (TYLENOL) 500 MG tablet Take 500 mg by mouth at bedtime.     . ALPRAZolam (XANAX) 0.5 MG tablet TAKE 1 TABLET BY MOUTH EVERY NIGHT AT BEDTIME AS NEEDED. FOR ANXIETY OR SLEEP. 30 tablet 3  . Calcium-Magnesium-Vitamin D (CALCIUM 1200+D3 PO) Take 1 tablet by mouth at bedtime.    . cyclobenzaprine (FLEXERIL) 10 MG tablet TAKE 1 TABLET BY MOUTH EVERY DAY AS NEEDED FOR MUSCLES SPASMS AS DIRECTED. 90 tablet 1  . diclofenac sodium (VOLTAREN) 1 % GEL Apply 4 g topically 4 (four) times daily. 5 Tube 11  . doxylamine, Sleep, (UNISOM) 25 MG tablet Take 25 mg by mouth at bedtime as needed.    . nitroGLYCERIN (NITROSTAT) 0.4 MG SL tablet Place 1 tablet (0.4 mg total) under the tongue every 5 (five) minutes as needed for chest pain. 100 tablet 3  . pantoprazole (PROTONIX) 40 MG tablet Take 1 tablet (40 mg total) by mouth  2 (two) times daily. 180 tablet 3  . traMADol (ULTRAM) 50 MG tablet TAKE ONE (1) TABLET BY MOUTH EVERY SIX HOURS AS NEEDED 50 tablet 0   No current facility-administered medications for this visit.     Allergies Atorvastatin; Ezetimibe; and Sulfamethoxazole-trimethoprim   Physical Exam:  BP 104/67 (BP Location: Left Arm, Patient Position: Sitting, Cuff Size: Normal)   Pulse 85   Resp 18   Ht 5' 4.5" (1.638 m)   Wt 158 lb (71.7 kg)   BMI 26.70 kg/m  Body mass index is 26.7 kg/m. General appearance: Well nourished, well developed female in no acute distress.   Laboratory: none  Radiology: none  Assessment: pt stable  Plan:  D/w her that since she is on such a low dose of the PO estrogen that she can either start doing every other day pills and eventually come off or she can just come off all together. I gave her behavioral interventions she can do to help with the HFs/NS and states that her most unbearable ones are at night and kept her up at night when she's not on HRT. She wants to come off and d/w her re: off label use of gabapentin, effexor r/b/a and she's amenable to trying these. Patient to stop PO estrogen and start gabapentin 300 bid and pt told to increase to tid if still having s/s. D/w pt OTC lubricants (oil vs water) and switching to premarin and she is amenable to both. Premarin sent in  RTC PRN. Pt told to call with any issues but if all going well, can just do phone refill and RTC 1 year for annual.   Durene Romans MD Attending Center for [employer] [facility])

## 2016-12-24 ENCOUNTER — Other Ambulatory Visit: Payer: Self-pay | Admitting: Family Medicine

## 2016-12-24 NOTE — Telephone Encounter (Signed)
Last filled 09-20-16 #50 Last CPE 05-10-16 No Future

## 2016-12-24 NOTE — Telephone Encounter (Signed)
Px written for call in   

## 2016-12-27 NOTE — Telephone Encounter (Signed)
Rx called to pharmacy as instructed. 

## 2017-01-04 ENCOUNTER — Encounter: Payer: Self-pay | Admitting: Family Medicine

## 2017-01-04 ENCOUNTER — Ambulatory Visit (INDEPENDENT_AMBULATORY_CARE_PROVIDER_SITE_OTHER): Payer: Medicare Other | Admitting: Family Medicine

## 2017-01-04 VITALS — BP 108/66 | HR 79 | Temp 98.3°F | Ht 63.75 in | Wt 156.0 lb

## 2017-01-04 DIAGNOSIS — M62838 Other muscle spasm: Secondary | ICD-10-CM

## 2017-01-04 DIAGNOSIS — W57XXXA Bitten or stung by nonvenomous insect and other nonvenomous arthropods, initial encounter: Secondary | ICD-10-CM | POA: Insufficient documentation

## 2017-01-04 NOTE — Assessment & Plan Note (Signed)
Unrelated to tick bite and going on for several months  Nl exam rec stretching/heat/ice prn  Update if it does not resolve

## 2017-01-04 NOTE — Progress Notes (Signed)
Subjective:    Patient ID: BLANDINA RENALDO, female    DOB: 1950-10-17, 66 y.o.   MRN: [id number]  HPI Here for a tick bite (just one)  Very small size   She is outdoors every day /a lot    Noted it on the back of her neck  Felt it yesterday  She pulled it off- it was dead   No rash  No fever    (had temp 101 2 weeks ago- before the tick bite)-thinks it was a virus  Last night she felt woozy after supper / then it went away  Dull sinus headache today (feels like sinus pressure)    Temp: 98.3 F (36.8 C)    Has had some sharp pains over the that area - not tender to the touch  Not swollen  This started months before the tick bite (2 mo)  Fleeting and not severe-several times per day  Does not stick around like a headache   Lost her mother since last visit Doing ok with grief Come unresolved family issues   Patient Active Problem List   Diagnosis Date Noted  . Tick bite 01/04/2017  . Neck muscle spasm 01/04/2017  . Hyperglycemia 06/02/2016  . Stress reaction 05/10/2016  . Estrogen deficiency 01/29/2015  . Menopausal disorder 05/03/2014  . Chest pain 05/22/2013  . TRANSAMINASES, SERUM, ELEVATED 06/18/2010  . 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 DEPRESSION 06/20/2008  . GERD 06/20/2008   Past Medical History:  Diagnosis Date  . Depression   . Fatty liver   . Fibrocystic disease of both breasts   . GERD (gastroesophageal reflux disease)   . Hyperglycemia   . 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 Sphincter of Oddi dysfunction    Past Surgical History:  Procedure Laterality Date  . CARPAL TUNNEL RELEASE  08/2008   left  . ERCP    . LEFT HEART CATHETERIZATION WITH CORONARY ANGIOGRAM N/A 06/08/2013   Procedure: LEFT HEART CATHETERIZATION WITH CORONARY ANGIOGRAM;  Surgeon: Ramond Dial, MD;  Location: [facility] CATH LAB;  Service: Cardiovascular;  Laterality: N/A;  . MASTECTOMY     bilateral  for severe macrocystic breast   Social History  Substance Use Topics  . Smoking status: Never Smoker  . Smokeless tobacco: Never Used  . Alcohol use 0.0 oz/week     Comment: occ   Family History  Problem Relation Age of Onset  . Hypertension Father   . Cancer Maternal Aunt     Pancreatic  . Diabetes Maternal Uncle   . Heart disease Maternal Grandmother   . Brain cancer Mother     hospice   Allergies  Allergen Reactions  . Atorvastatin     REACTION: rash  . Ezetimibe     REACTION: muscle aches  . Sulfamethoxazole-Trimethoprim Other (See Comments)    Swelling of nostrils   Current Outpatient Prescriptions on File Prior to Visit  Medication Sig Dispense Refill  . acetaminophen (TYLENOL) 500 MG tablet Take 500 mg by mouth at bedtime.     . ALPRAZolam (XANAX) 0.5 MG tablet TAKE 1 TABLET BY MOUTH EVERY NIGHT AT BEDTIME AS NEEDED. FOR ANXIETY OR SLEEP. 30 tablet 3  . Calcium-Magnesium-Vitamin D (CALCIUM 1200+D3 PO) Take 1 tablet by mouth at bedtime.    . conjugated estrogens (PREMARIN) vaginal cream Quarter applicator qhs 2x/week 30.1 g 12  . cyclobenzaprine (FLEXERIL) 10 MG tablet TAKE 1  TABLET BY MOUTH EVERY DAY AS NEEDED FOR MUSCLES SPASMS AS DIRECTED. 90 tablet 1  . diclofenac sodium (VOLTAREN) 1 % GEL Apply 4 g topically 4 (four) times daily. 5 Tube 11  . doxylamine, Sleep, (UNISOM) 25 MG tablet Take 25 mg by mouth at bedtime as needed.    . gabapentin (NEURONTIN) 300 MG capsule Take 1 capsule (300 mg total) by mouth 2 (two) times daily. If s/s aren't significantly improved you can increase to 300mg  tid. 90 capsule 11  . nitroGLYCERIN (NITROSTAT) 0.4 MG SL tablet Place 1 tablet (0.4 mg total) under the tongue every 5 (five) minutes as needed for chest pain. 100 tablet 3  . pantoprazole (PROTONIX) 40 MG tablet Take 1 tablet (40 mg total) by mouth 2 (two) times daily. 180 tablet 3  . traMADol (ULTRAM) 50 MG tablet TAKE ONE (1) TABLET BY MOUTH EVERY SIX HOURS AS NEEDED 50 tablet 0     No current facility-administered medications on file prior to visit.     Review of Systems Review of Systems  Constitutional: Negative for fever, appetite change, fatigue and unexpected weight change.  Eyes: Negative for pain and visual disturbance.  Respiratory: Negative for cough and shortness of breath.   Cardiovascular: Negative for cp or palpitations    Gastrointestinal: Negative for nausea, diarrhea and constipation.  Genitourinary: Negative for urgency and frequency.  Skin: Negative for pallor or rash   MSK pos for occ R post neck pain that rad to head/ fleeting  Neurological: Negative for weakness, light-headedness, numbness and headaches.  Hematological: Negative for adenopathy. Does not bruise/bleed easily.  Psychiatric/Behavioral: Negative for dysphoric mood. The patient is not nervous/anxious.  pos for symptoms of grief        Objective:   Physical Exam  Constitutional: She appears well-developed and well-nourished. No distress.  HENT:  Head: Normocephalic and atraumatic.  Right Ear: External ear normal.  Left Ear: External ear normal.  Mouth/Throat: Oropharynx is clear and moist. No oropharyngeal exudate.  Eyes: Conjunctivae and EOM are normal. Pupils are equal, round, and reactive to light. Right eye exhibits no discharge. Left eye exhibits no discharge. No scleral icterus.  Neck: Normal range of motion. Neck supple. No thyromegaly present.  Tight R posterior cervical musculature Nl rom No tenderness  Cardiovascular: Normal rate and regular rhythm.   Pulmonary/Chest: Effort normal and breath sounds normal. No respiratory distress. She has no wheezes. She has no rales.  Abdominal: Soft. She exhibits no distension. There is no tenderness.  Musculoskeletal: She exhibits no edema or tenderness.  Lymphadenopathy:    She has no cervical adenopathy.  Neurological: She is alert. She has normal reflexes. No cranial nerve deficit.  Skin: Skin is warm and dry. No rash  noted. No erythema. No pallor.  Small 1-2 mm dot of erythema where tick was prev No remaining tick parts No rash   Psychiatric: She has a normal mood and affect.  Disc grief-not tearful           Assessment & Plan:   Problem List Items Addressed This Visit      Musculoskeletal and Integument   Neck muscle spasm    Unrelated to tick bite and going on for several months  Nl exam rec stretching/heat/ice prn  Update if it does not resolve      Tick bite    Deer tick/entirely removed and not engorged No rash at site or elsewhere No constitutional symptoms or fever Will watch for any new symptoms  Disc tick site care  Will update

## 2017-01-04 NOTE — Assessment & Plan Note (Signed)
Deer tick/entirely removed and not engorged No rash at site or elsewhere No constitutional symptoms or fever Will watch for any new symptoms  Disc tick site care  Will update

## 2017-01-04 NOTE — Progress Notes (Signed)
Pre visit review using our clinic review tool, if applicable. No additional management support is needed unless otherwise documented below in the visit note. 

## 2017-01-04 NOTE — Patient Instructions (Addendum)
Your tick bite site looks good- keep it clean with soap and water  Watch for rash/ fever/sore throat or other symptoms of illness   If new symptoms develop please alert me   If the bite develops redness/ swelling or rings around it also alert me   You can use a cool compress for itching   If your posterior head pains worsen or do not go away over time also let us know (I suspect this comes from tight neck muscles)

## 2017-01-17 DIAGNOSIS — S61300A Unspecified open wound of right index finger with damage to nail, initial encounter: Secondary | ICD-10-CM | POA: Diagnosis not present

## 2017-01-25 ENCOUNTER — Other Ambulatory Visit: Payer: Self-pay | Admitting: Family Medicine

## 2017-01-25 NOTE — Telephone Encounter (Signed)
Will refill electronically  

## 2017-01-25 NOTE — Telephone Encounter (Signed)
Last refill on 08/10/2016   #90 with 1 refill Last office visit on 01/04/2017

## 2017-01-26 DIAGNOSIS — S61300D Unspecified open wound of right index finger with damage to nail, subsequent encounter: Secondary | ICD-10-CM | POA: Diagnosis not present

## 2017-03-03 ENCOUNTER — Other Ambulatory Visit: Payer: Self-pay | Admitting: Family Medicine

## 2017-03-04 NOTE — Telephone Encounter (Signed)
Rx called in to requested pharmacy 

## 2017-03-04 NOTE — Telephone Encounter (Signed)
CPE scheduled 05/31/17, last filled on 10/25/16 #30 tabs with 3 additional refills, please advise

## 2017-03-04 NOTE — Telephone Encounter (Signed)
Px written for call in   

## 2017-03-15 ENCOUNTER — Ambulatory Visit (INDEPENDENT_AMBULATORY_CARE_PROVIDER_SITE_OTHER): Payer: Medicare Other | Admitting: Primary Care

## 2017-03-15 ENCOUNTER — Encounter: Payer: Self-pay | Admitting: Primary Care

## 2017-03-15 VITALS — BP 112/70 | HR 81 | Temp 97.5°F | Ht 63.75 in | Wt 157.1 lb

## 2017-03-15 DIAGNOSIS — W57XXXA Bitten or stung by nonvenomous insect and other nonvenomous arthropods, initial encounter: Secondary | ICD-10-CM | POA: Diagnosis not present

## 2017-03-15 DIAGNOSIS — S0006XA Insect bite (nonvenomous) of scalp, initial encounter: Secondary | ICD-10-CM | POA: Diagnosis not present

## 2017-03-15 NOTE — Patient Instructions (Signed)
Please notify us if you develop high fevers, increased body aches, a large "bulls eye" rash to the site of the tick bite.   Start using insect repellent before you mow the lawn.  It was a pleasure to see you today!   Tick Bite Information, Adult Ticks are insects that draw blood for food. Most ticks live in shrubs and grassy areas. They climb onto people and animals that brush against the leaves and grasses that they rest on. Then they bite, attaching themselves to the skin. Most ticks are harmless, but some ticks carry germs that can spread to a person through a bite and cause a disease. To reduce your risk of getting a disease from a tick bite, it is important to take steps to prevent tick bites. It is also important to check for ticks after being outdoors. If you find that a tick has attached to you, watch for symptoms of disease. How can I prevent tick bites? Take these steps to help prevent tick bites when you are outdoors in an area where ticks are found:  Use insect repellent that has DEET (20% or higher), picaridin, or IR3535 in it. Use it on: ? Skin that is showing. ? The top of your boots. ? Your pant legs. ? Your sleeve cuffs.  For repellent products that contain permethrin, follow product instructions. Use these products on: ? Clothing. ? Gear. ? Boots. ? Tents.  Wear protective clothing. Long sleeves and long pants offer the best protection from ticks.  Wear light-colored clothing so you can see ticks more easily.  Tuck your pant legs into your socks.  If you go walking on a trail, stay in the middle of the trail so your skin, hair, and clothing do not touch the bushes.  Avoid walking through areas with long grass.  Check for ticks on your clothing, hair, and skin often while you are outside, and check again before you go inside. Make sure to check the places that ticks attach themselves most often. These places include the scalp, neck, armpits, waist, groin, and joint  areas. Ticks that carry a disease called Lyme disease have to be attached to the skin for 24-48 hours. Checking for ticks every day will lessen your risk of this and other diseases.  When you come indoors, wash your clothes and take a shower or a bath right away. Dry your clothes in a dryer on high heat for at least 60 minutes. This will kill any ticks in your clothes.  What is the proper way to remove a tick? If you find a tick on your body, remove it as soon as possible. Removing a tick sooner rather than later can prevent germs from passing from the tick to your body. To remove a tick that is crawling on your skin but has not bitten:  Go outdoors and brush the tick off.  Remove the tick with tape or a lint roller.  To remove a tick that is attached to your skin:  Wash your hands.  If you have latex gloves, put them on.  Use tweezers, curved forceps, or a tick-removal tool to gently grasp the tick as close to your skin and the tick's head as possible.  Gently pull with steady, upward pressure until the tick lets go. When removing the tick: ? Take care to keep the tick's head attached to its body. ? Do not twist or jerk the tick. This can make the tick's head or mouth break off. ?  Do not squeeze or crush the tick's body. This could force disease-carrying fluids from the tick into your body.  Do not try to remove a tick with heat, alcohol, petroleum jelly, or fingernail polish. Using these methods can cause the tick to salivate and regurgitate into your bloodstream, increasing your risk of getting a disease. What should I do after removing a tick?  Clean the bite area with soap and water, rubbing alcohol, or an iodine scrub.  If an antiseptic cream or ointment is available, apply a small amount to the bite site.  Wash and disinfect any instruments that you used to remove the tick. How should I dispose of a tick? To dispose of a live tick, use one of these methods:  Place it in  rubbing alcohol.  Place it in a sealed bag or container.  Wrap it tightly in tape.  Flush it down the toilet.  Contact a health care provider if:  You have symptoms of a disease after a tick bite. Symptoms of a tick-borne disease can occur from moments after the tick bites to up to 30 days after a tick is removed. Symptoms include: ? Muscle, joint, or bone pain. ? Difficulty walking or moving your legs. ? Numbness in the legs. ? Paralysis. ? Red rash around the tick bite area that is shaped like a target or a "bull's-eye." ? Redness and swelling in the area of the tick bite. ? Fever. ? Repeated vomiting. ? Diarrhea. ? Weight loss. ? Tender, swollen lymph glands. ? Shortness of breath. ? Cough. ? Pain in the abdomen. ? Headache. ? Abnormal tiredness. ? A change in your level of consciousness. ? Confusion. Get help right away if:  You are not able to remove a tick.  A part of a tick breaks off and gets stuck in your skin.  Your symptoms get worse. Summary  Ticks may carry germs that can spread to a person through a bite and cause disease.  Wear protective clothing and use insect repellent to prevent tick bites. Follow product instructions.  If you find a tick on your body, remove it as soon as possible. If the tick is attached, do not try to remove with heat, alcohol, petroleum jelly, or fingernail polish.  Remove the attached tick using tweezers, curved forceps, or a tick-removal tool. Gently pull with steady, upward pressure until the tick lets go. Do not twist or jerk the tick. Do not squeeze or crush the tick's body.  If you have symptoms after being bitten by a tick, contact a health care provider. This information is not intended to replace advice given to you by your health care provider. Make sure you discuss any questions you have with your health care provider. Document Released: 08/13/2000 Document Revised: 05/28/2016 Document Reviewed: 05/28/2016 Elsevier  Interactive Patient Education  [employer].

## 2017-03-15 NOTE — Progress Notes (Signed)
Subjective:    Patient ID: [name], female    DOB: 1951/02/15, 66 y.o.   MRN: [id number]  HPI  [name] is a 66 year old female who presents today with a chief complaint of tick bite. She first noticed the tick on 03/08/17 to her scalp on the upper left occipital lobe. This would be her third tick bite this year. She's experienced symptoms of itching, swelling, feeling slightly fatigued, minor neck stiffness, and was told the tick bite site was "red". She denies fevers, headaches, rashes, body aches elsewhere to her body. Overall she's feeling better. She's been mowing a 6 acre lot of grass and is not wearing insect repellant.  Review of Systems  Constitutional: Positive for fatigue. Negative for chills and fever.  Gastrointestinal: Negative for nausea.  Musculoskeletal: Positive for neck stiffness. Negative for arthralgias and myalgias.  Skin: Negative for rash.  Neurological: Negative for dizziness and headaches.       Past Medical History:  Diagnosis Date  . Depression   . Fatty liver   . Fibrocystic disease of both breasts   . GERD (gastroesophageal reflux disease)   . Hyperglycemia   . 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 Sphincter of Oddi dysfunction      Social History   Social History  . Marital status: Married    Spouse name: N/A  . Number of children: N/A  . Years of education: N/A   Occupational History  . Not on file.   Social History Main Topics  . Smoking status: Never Smoker  . Smokeless tobacco: Never Used  . Alcohol use 0.0 oz/week     Comment: occ  . Drug use: No  . Sexual activity: Yes    Birth control/ protection: Surgical   Other Topics Concern  . Not on file   Social History Narrative  . No narrative on file    Past Surgical History:  Procedure Laterality Date  . CARPAL TUNNEL RELEASE  08/2008   left  . ERCP    . LEFT HEART CATHETERIZATION WITH CORONARY ANGIOGRAM N/A  06/08/2013   Procedure: LEFT HEART CATHETERIZATION WITH CORONARY ANGIOGRAM;  Surgeon: Ramond Dial, MD;  Location: [facility] CATH LAB;  Service: Cardiovascular;  Laterality: N/A;  . MASTECTOMY     bilateral for severe macrocystic breast    Family History  Problem Relation Age of Onset  . Hypertension Father   . Cancer Maternal Aunt        Pancreatic  . Diabetes Maternal Uncle   . Heart disease Maternal Grandmother   . Brain cancer Mother        hospice    Allergies  Allergen Reactions  . Atorvastatin     REACTION: rash  . Ezetimibe     REACTION: muscle aches  . Sulfamethoxazole-Trimethoprim Other (See Comments)    Swelling of nostrils    Current Outpatient Prescriptions on File Prior to Visit  Medication Sig Dispense Refill  . acetaminophen (TYLENOL) 500 MG tablet Take 500 mg by mouth at bedtime.     . ALPRAZolam (XANAX) 0.5 MG tablet TAKE 1 TABLET BY MOUTH EVERY NIGHT AT BEDTIME AS NEEDED. FOR ANXIETY OR SLEEP. 30 tablet 3  . Calcium-Magnesium-Vitamin D (CALCIUM 1200+D3 PO) Take 1 tablet by mouth at bedtime.    . conjugated estrogens (PREMARIN) vaginal cream Quarter applicator qhs 2x/week 35.4 g 12  . cyclobenzaprine (FLEXERIL) 10 MG tablet TAKE ONE (  1) TABLET BY MOUTH DAILY AS NEEDED FOR MUSCLE SPASMS AS DIRECTED 90 tablet 1  . diclofenac sodium (VOLTAREN) 1 % GEL Apply 4 g topically 4 (four) times daily. 5 Tube 11  . doxylamine, Sleep, (UNISOM) 25 MG tablet Take 25 mg by mouth at bedtime as needed.    . gabapentin (NEURONTIN) 300 MG capsule Take 1 capsule (300 mg total) by mouth 2 (two) times daily. If s/s aren't significantly improved you can increase to 300mg  tid. 90 capsule 11  . nitroGLYCERIN (NITROSTAT) 0.4 MG SL tablet Place 1 tablet (0.4 mg total) under the tongue every 5 (five) minutes as needed for chest pain. 100 tablet 3  . pantoprazole (PROTONIX) 40 MG tablet Take 1 tablet (40 mg total) by mouth 2 (two) times daily. 180 tablet 3  . traMADol (ULTRAM) 50 MG tablet  TAKE ONE (1) TABLET BY MOUTH EVERY SIX HOURS AS NEEDED 50 tablet 0   No current facility-administered medications on file prior to visit.     BP 112/70   Pulse 81   Temp (!) 97.5 F (36.4 C) (Oral)   Ht 5' 3.75" (1.619 m)   Wt 157 lb 1.9 oz (71.3 kg)   SpO2 95%   BMI 27.18 kg/m    Objective:   Physical Exam  Constitutional: She appears well-nourished.  Neck: Neck supple.  Cardiovascular: Normal rate and regular rhythm.   Pulmonary/Chest: Effort normal and breath sounds normal.  Skin: Skin is warm and dry. No rash noted. No erythema.  Tick bite site to left posterior parietal/upper left occipital lobe. Non tender. No erythema.          Assessment & Plan:  Tick Bite:  Found tick on 03/08/17 to left posterior parietal/occipital lobe. Overall feeling improved. Exam today without evidence of Lyme Disease/RMSF. Vitals normal. Does not appear ill. Discussed typical symptoms of tick bites, discussed warning signs of tick borne illnesses, discussed tick bite prevention including insect repellents with DEET. Follow up PRN.  Sheral Flow, NP

## 2017-03-29 ENCOUNTER — Other Ambulatory Visit: Payer: Self-pay | Admitting: Family Medicine

## 2017-03-29 NOTE — Telephone Encounter (Signed)
Will refill electronically  

## 2017-03-29 NOTE — Telephone Encounter (Signed)
Last office visit 03/15/2017 with Gentry Fitz for tick bite.  Last refilled 03/10/16 for 5 tubes with 11 refills.  Ok to refill?

## 2017-05-25 ENCOUNTER — Telehealth: Payer: Self-pay | Admitting: Family Medicine

## 2017-05-25 DIAGNOSIS — Z0000 Encounter for general adult medical examination without abnormal findings: Secondary | ICD-10-CM

## 2017-05-25 DIAGNOSIS — R739 Hyperglycemia, unspecified: Secondary | ICD-10-CM

## 2017-05-25 NOTE — Telephone Encounter (Signed)
-----   Message from Eustace Pen, LPN sent at 9/47/0962  9:28 PM EDT ----- Regarding: Labs 9/27 Lab orders needed.  [facility] Medicare

## 2017-05-26 ENCOUNTER — Ambulatory Visit (INDEPENDENT_AMBULATORY_CARE_PROVIDER_SITE_OTHER): Payer: Medicare Other

## 2017-05-26 VITALS — BP 110/80 | HR 67 | Temp 98.4°F | Ht 63.75 in | Wt 155.5 lb

## 2017-05-26 DIAGNOSIS — Z0000 Encounter for general adult medical examination without abnormal findings: Secondary | ICD-10-CM | POA: Diagnosis not present

## 2017-05-26 DIAGNOSIS — R739 Hyperglycemia, unspecified: Secondary | ICD-10-CM | POA: Diagnosis not present

## 2017-05-26 DIAGNOSIS — Z23 Encounter for immunization: Secondary | ICD-10-CM | POA: Diagnosis not present

## 2017-05-26 LAB — CBC WITH DIFFERENTIAL/PLATELET
Basophils Absolute: 0 10*3/uL (ref 0.0–0.1)
Basophils Relative: 0.8 % (ref 0.0–3.0)
Eosinophils Absolute: 0.1 10*3/uL (ref 0.0–0.7)
Eosinophils Relative: 2.7 % (ref 0.0–5.0)
HCT: 40.7 % (ref 36.0–46.0)
Hemoglobin: 13.4 g/dL (ref 12.0–15.0)
Lymphocytes Relative: 39.6 % (ref 12.0–46.0)
Lymphs Abs: 2.1 10*3/uL (ref 0.7–4.0)
MCHC: 33 g/dL (ref 30.0–36.0)
MCV: 87.9 fl (ref 78.0–100.0)
Monocytes Absolute: 0.5 10*3/uL (ref 0.1–1.0)
Monocytes Relative: 9.1 % (ref 3.0–12.0)
Neutro Abs: 2.5 10*3/uL (ref 1.4–7.7)
Neutrophils Relative %: 47.8 % (ref 43.0–77.0)
Platelets: 271 10*3/uL (ref 150.0–400.0)
RBC: 4.63 Mil/uL (ref 3.87–5.11)
RDW: 13.9 % (ref 11.5–15.5)
WBC: 5.3 10*3/uL (ref 4.0–10.5)

## 2017-05-26 LAB — LIPID PANEL
Cholesterol: 198 mg/dL (ref 0–200)
HDL: 66 mg/dL (ref >39.00)
LDL Cholesterol: 105 mg/dL — ABNORMAL HIGH (ref 0–99)
NonHDL: 132.17
Total CHOL/HDL Ratio: 3
Triglycerides: 137 mg/dL (ref 0.0–149.0)
VLDL: 27.4 mg/dL (ref 0.0–40.0)

## 2017-05-26 LAB — COMPREHENSIVE METABOLIC PANEL
ALT: 19 U/L (ref 0–35)
AST: 17 U/L (ref 0–37)
Albumin: 4.6 g/dL (ref 3.5–5.2)
Alkaline Phosphatase: 49 U/L (ref 39–117)
BUN: 14 mg/dL (ref 6–23)
CO2: 29 mEq/L (ref 19–32)
Calcium: 9.9 mg/dL (ref 8.4–10.5)
Chloride: 103 mEq/L (ref 96–112)
Creatinine, Ser: 0.55 mg/dL (ref 0.40–1.20)
GFR: 116.93 mL/min (ref >60.00)
Glucose, Bld: 108 mg/dL — ABNORMAL HIGH (ref 70–99)
Potassium: 3.9 mEq/L (ref 3.5–5.1)
Sodium: 140 mEq/L (ref 135–145)
Total Bilirubin: 1.3 mg/dL — ABNORMAL HIGH (ref 0.2–1.2)
Total Protein: 7.3 g/dL (ref 6.0–8.3)

## 2017-05-26 LAB — TSH: TSH: 2.59 u[IU]/mL (ref 0.35–4.50)

## 2017-05-26 LAB — HEMOGLOBIN A1C: Hgb A1c MFr Bld: 6.3 % (ref 4.6–6.5)

## 2017-05-26 NOTE — Patient Instructions (Signed)
Ms. Niebla , Thank you for taking time to come for your Medicare Wellness Visit. I appreciate your ongoing commitment to your health goals. Please review the following plan we discussed and let me know if I can assist you in the future.   These are the goals we discussed: Goals    . Weight (lb) < 141 lb (64 kg)          Target weight is 140 lbs. Starting 05/26/2017, I will continue to monitor intake of white bread, white potatoes, and other simple carbs.        This is a list of the screening recommended for you and due dates:  Health Maintenance  Topic Date Due  . Mammogram  03/11/2019*  . Colon Cancer Screening  08/11/2023  . Tetanus Vaccine  05/03/2024  . Flu Shot  Completed  . DEXA scan (bone density measurement)  Completed  .  Hepatitis C: One time screening is recommended by Center for Disease Control  (CDC) for  adults born from [birth year] through 1965.   Completed  . Pneumonia vaccines  Completed  *Topic was postponed. The date shown is not the original due date.   Preventive Care for Adults  A healthy lifestyle and preventive care can promote health and wellness. Preventive health guidelines for adults include the following key practices.  . A routine yearly physical is a good way to check with your health care provider about your health and preventive screening. It is a chance to share any concerns and updates on your health and to receive a thorough exam.  . Visit your dentist for a routine exam and preventive care every 6 months. Brush your teeth twice a day and floss once a day. Good oral hygiene prevents tooth decay and gum disease.  . The frequency of eye exams is based on your age, health, family medical history, use  of contact lenses, and other factors. Follow your health care provider's ecommendations for frequency of eye exams.  . Eat a healthy diet. Foods like vegetables, fruits, whole grains, low-fat dairy products, and lean protein foods contain the nutrients you  need without too many calories. Decrease your intake of foods high in solid fats, added sugars, and salt. Eat the right amount of calories for you. Get information about a proper diet from your health care provider, if necessary.  . Regular physical exercise is one of the most important things you can do for your health. Most adults should get at least 150 minutes of moderate-intensity exercise (any activity that increases your heart rate and causes you to sweat) each week. In addition, most adults need muscle-strengthening exercises on 2 or more days a week.  Silver Sneakers may be a benefit available to you. To determine eligibility, you may visit the website: www.silversneakers.com or contact program at 8193636070 Mon-Fri between 8AM-8PM.   . Maintain a healthy weight. The body mass index (BMI) is a screening tool to identify possible weight problems. It provides an estimate of body fat based on height and weight. Your health care provider can find your BMI and can help you achieve or maintain a healthy weight.   For adults 20 years and older: ? A BMI below 18.5 is considered underweight. ? A BMI of 18.5 to 24.9 is normal. ? A BMI of 25 to 29.9 is considered overweight. ? A BMI of 30 and above is considered obese.   . Maintain normal blood lipids and cholesterol levels by exercising and minimizing your intake  of saturated fat. Eat a balanced diet with plenty of fruit and vegetables. Blood tests for lipids and cholesterol should begin at age 68 and be repeated every 5 years. If your lipid or cholesterol levels are high, you are over 50, or you are at high risk for heart disease, you may need your cholesterol levels checked more frequently. Ongoing high lipid and cholesterol levels should be treated with medicines if diet and exercise are not working.  . If you smoke, find out from your health care provider how to quit. If you do not use tobacco, please do not start.  . If you choose to drink  alcohol, please do not consume more than 2 drinks per day. One drink is considered to be 12 ounces (355 mL) of beer, 5 ounces (148 mL) of wine, or 1.5 ounces (44 mL) of liquor.  . If you are 37-16 years old, ask your health care provider if you should take aspirin to prevent strokes.  . Use sunscreen. Apply sunscreen liberally and repeatedly throughout the day. You should seek shade when your shadow is shorter than you. Protect yourself by wearing long sleeves, pants, a wide-brimmed hat, and sunglasses year round, whenever you are outdoors.  . Once a month, do a whole body skin exam, using a mirror to look at the skin on your back. Tell your health care provider of new moles, moles that have irregular borders, moles that are larger than a pencil eraser, or moles that have changed in shape or color.

## 2017-05-26 NOTE — Progress Notes (Signed)
PCP notes:   Health maintenance:  Flu vaccine - administered Mammogram - addressed; PCP please discuss whether or not mammograms are necessary at next appt  Abnormal screenings:   Depression score: 2 Fall risk- hx of a single fall without injury  Patient concerns:   None  Nurse concerns:  None  Next PCP appt:   05/31/17 @ 0830

## 2017-05-26 NOTE — Progress Notes (Signed)
I reviewed health advisor's note, was available for consultation, and agree with documentation and plan.  

## 2017-05-26 NOTE — Progress Notes (Signed)
Pre visit review using our clinic review tool, if applicable. No additional management support is needed unless otherwise documented below in the visit note. 

## 2017-05-26 NOTE — Progress Notes (Signed)
Subjective:   [name] is a 66 y.o. female who presents for Medicare Annual (Subsequent) preventive examination.  Review of Systems:  N/A Cardiac Risk Factors include: advanced age (>7men, >63 women)     Objective:     Vitals: BP 110/80 (BP Location: Right Arm, Patient Position: Sitting, Cuff Size: Normal)   Pulse 67   Temp 98.4 F (36.9 C) (Oral)   Ht 5' 3.75" (1.619 m) Comment: no shoes  Wt 155 lb 8 oz (70.5 kg)   SpO2 98%   BMI 26.90 kg/m   Body mass index is 26.9 kg/m.   Tobacco History  Smoking Status  . Never Smoker  Smokeless Tobacco  . Never Used     Counseling given: No   Past Medical History:  Diagnosis Date  . Depression   . Fatty liver   . Fibrocystic disease of both breasts   . GERD (gastroesophageal reflux disease)   . Hyperglycemia   . 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 Sphincter of Oddi dysfunction    Past Surgical History:  Procedure Laterality Date  . CARPAL TUNNEL RELEASE  08/2008   left  . ERCP    . LEFT HEART CATHETERIZATION WITH CORONARY ANGIOGRAM N/A 06/08/2013   Procedure: LEFT HEART CATHETERIZATION WITH CORONARY ANGIOGRAM;  Surgeon: Ramond Dial, MD;  Location: [facility] CATH LAB;  Service: Cardiovascular;  Laterality: N/A;  . MASTECTOMY     bilateral for severe macrocystic breast   Family History  Problem Relation Age of Onset  . Hypertension Father   . Cancer Maternal Aunt        Pancreatic  . Diabetes Maternal Uncle   . Heart disease Maternal Grandmother   . Brain cancer Mother        hospice   History  Sexual Activity  . Sexual activity: Yes  . Birth control/ protection: Surgical    Outpatient Encounter Prescriptions as of 05/26/2017  Medication Sig  . acetaminophen (TYLENOL) 500 MG tablet Take 500 mg by mouth at bedtime.   . ALPRAZolam (XANAX) 0.5 MG tablet TAKE 1 TABLET BY MOUTH EVERY NIGHT AT BEDTIME AS NEEDED. FOR ANXIETY OR SLEEP.  .  Calcium-Magnesium-Vitamin D (CALCIUM 1200+D3 PO) Take 1 tablet by mouth at bedtime.  . conjugated estrogens (PREMARIN) vaginal cream Quarter applicator qhs 2x/week  . cyclobenzaprine (FLEXERIL) 10 MG tablet TAKE ONE (1) TABLET BY MOUTH DAILY AS NEEDED FOR MUSCLE SPASMS AS DIRECTED  . diclofenac sodium (VOLTAREN) 1 % GEL APPLY 4 (FOUR) GRAMS TOPICALLY 4 (FOUR) TIMES DAILY  . doxylamine, Sleep, (UNISOM) 25 MG tablet Take 25 mg by mouth at bedtime as needed.  . gabapentin (NEURONTIN) 300 MG capsule Take 1 capsule (300 mg total) by mouth 2 (two) times daily. If s/s aren't significantly improved you can increase to 300mg  tid.  . nitroGLYCERIN (NITROSTAT) 0.4 MG SL tablet Place 1 tablet (0.4 mg total) under the tongue every 5 (five) minutes as needed for chest pain.  . pantoprazole (PROTONIX) 40 MG tablet Take 1 tablet (40 mg total) by mouth 2 (two) times daily.  . traMADol (ULTRAM) 50 MG tablet TAKE ONE (1) TABLET BY MOUTH EVERY SIX HOURS AS NEEDED   No facility-administered encounter medications on file as of 05/26/2017.     Activities of Daily Living In your present state of health, do you have any difficulty performing the following activities: 05/26/2017  Hearing? Y  Vision? N  Difficulty concentrating  or making decisions? N  Walking or climbing stairs? N  Dressing or bathing? N  Doing errands, shopping? N  Preparing Food and eating ? N  Using the Toilet? N  In the past six months, have you accidently leaked urine? N  Do you have problems with loss of bowel control? N  Managing your Medications? N  Managing your Finances? N  Housekeeping or managing your Housekeeping? N  Some recent data might be hidden    Patient Care Team: Tower, Wynelle Fanny, MD as PCP - General Ernestina Patches Juanita Craver, MD as Consulting Physician (Obstetrics and Gynecology) Sharyne Peach, MD as Consulting Physician (Ophthalmology)    Assessment:      Hearing Screening   125Hz  250Hz  500Hz  1000Hz  2000Hz  3000Hz  4000Hz   6000Hz  8000Hz   Right ear:   40 40 40  40    Left ear:   40 40 40  40      Visual Acuity Screening   Right eye Left eye Both eyes  Without correction:     With correction: 20/13 20/13-1 20/10-1    Exercise Activities and Dietary recommendations Current Exercise Habits: Home exercise routine, Type of exercise: walking, Frequency (Times/Week): 7, Intensity: Mild, Exercise limited by: None identified  Goals    . Weight (lb) < 141 lb (64 kg)          Target weight is 140 lbs. Starting 05/26/2017, I will continue to monitor intake of white bread, white potatoes, and other simple carbs.       Fall Risk Fall Risk  05/26/2017 05/10/2016 01/29/2015  Falls in the past year? Yes No No  Comment pt tripped over box - -  Number falls in past yr: 1 - -  Injury with Fall? No - -   Depression Screen PHQ 2/9 Scores 05/26/2017 05/10/2016 01/29/2015  PHQ - 2 Score 1 0 0  PHQ- 9 Score 2 - -     Cognitive Function MMSE - Mini Mental State Exam 05/10/2016  Orientation to time 5  Orientation to Place 5  Registration 3  Attention/ Calculation 0  Recall 3  Language- name 2 objects 0  Language- repeat 1  Language- follow 3 step command 3  Language- read & follow direction 0  Write a sentence 0  Copy design 0  Total score 20   PLEASE NOTE: A Mini-Cog screen was completed. Maximum score is 20. A value of 0 denotes this part of Folstein MMSE was not completed or the patient failed this part of the Mini-Cog screening.   Mini-Cog Screening Orientation to Time - Max 5 pts Orientation to Place - Max 5 pts Registration - Max 3 pts Recall - Max 3 pts Language Repeat - Max 1 pts Language Follow 3 Step Command - Max 3 pts     Immunization History  Administered Date(s) Administered  . Influenza Whole 05/30/2008  . Influenza,inj,Quad PF,6+ Mos 05/03/2014, 06/06/2015, 05/10/2016, 05/26/2017  . Pneumococcal Conjugate-13 01/29/2015  . Pneumococcal Polysaccharide-23 05/10/2016  . Td 08/30/2002  . Tdap  05/03/2014  . Zoster 09/07/2012   Screening Tests Health Maintenance  Topic Date Due  . MAMMOGRAM  03/11/2019 (Originally 03/11/2017)  . COLONOSCOPY  08/11/2023  . TETANUS/TDAP  05/03/2024  . INFLUENZA VACCINE  Completed  . DEXA SCAN  Completed  . Hepatitis C Screening  Completed  . PNA vac Low Risk Adult  Completed      Plan:     I have personally reviewed and addressed the Medicare Annual Wellness questionnaire and  have noted the following in the patient's chart:  A. Medical and social history B. Use of alcohol, tobacco or illicit drugs  C. Current medications and supplements D. Functional ability and status E.  Nutritional status F.  Physical activity G. Advance directives H. List of other physicians I.  Hospitalizations, surgeries, and ER visits in previous 12 months J.  [place] to include hearing, vision, cognitive, depression L. Referrals and appointments - none  In addition, I have reviewed and discussed with patient certain preventive protocols, quality metrics, and best practice recommendations. A written personalized care plan for preventive services as well as general preventive health recommendations were provided to patient.  See attached scanned questionnaire for additional information.   Signed,   Lindell Noe, MHA, BS, LPN Health Coach

## 2017-05-31 ENCOUNTER — Ambulatory Visit (INDEPENDENT_AMBULATORY_CARE_PROVIDER_SITE_OTHER): Payer: Medicare Other | Admitting: Family Medicine

## 2017-05-31 ENCOUNTER — Encounter: Payer: Self-pay | Admitting: Family Medicine

## 2017-05-31 VITALS — BP 126/78 | HR 71 | Temp 98.3°F | Ht 63.75 in | Wt 157.5 lb

## 2017-05-31 DIAGNOSIS — Z1231 Encounter for screening mammogram for malignant neoplasm of breast: Secondary | ICD-10-CM | POA: Diagnosis not present

## 2017-05-31 DIAGNOSIS — R739 Hyperglycemia, unspecified: Secondary | ICD-10-CM | POA: Diagnosis not present

## 2017-05-31 DIAGNOSIS — Z0000 Encounter for general adult medical examination without abnormal findings: Secondary | ICD-10-CM | POA: Diagnosis not present

## 2017-05-31 DIAGNOSIS — Z1239 Encounter for other screening for malignant neoplasm of breast: Secondary | ICD-10-CM

## 2017-05-31 MED ORDER — PANTOPRAZOLE SODIUM 40 MG PO TBEC: 40.0000 mg | DELAYED_RELEASE_TABLET | Freq: Two times a day (BID) | ORAL | 3 refills | Status: DC

## 2017-05-31 NOTE — Assessment & Plan Note (Addendum)
Reviewed health habits including diet and exercise and skin cancer prevention Reviewed appropriate screening tests for age  Also reviewed health mt list, fam hx and immunization status , as well as social and family history   See HPI Labs reviewed  Mammogram ordered for screening (pt has 10% breast tissue)  Already had flu vaccine  Disc fall prevention  Wants to put off dexa another year

## 2017-05-31 NOTE — Patient Instructions (Addendum)
We will refer you for a mammogram   Stay active  Keep watching sugar/fat in diet   Use ice on your toe and wear a hard soled/supportive shoe
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
## 2017-05-31 NOTE — Assessment & Plan Note (Signed)
Lab Results  Component Value Date   HGBA1C 6.3 05/26/2017   This is fairly stable disc imp of low glycemic diet and wt loss to prevent DM2

## 2017-05-31 NOTE — Assessment & Plan Note (Signed)
Ref for mammogram Pt has implants with 10% breast tissue  Nl exam  Mastectomy in the past for severe fibrocystic dz

## 2017-06-06 ENCOUNTER — Ambulatory Visit (INDEPENDENT_AMBULATORY_CARE_PROVIDER_SITE_OTHER): Payer: Medicare Other | Admitting: Family Medicine

## 2017-06-06 ENCOUNTER — Encounter: Payer: Self-pay | Admitting: Family Medicine

## 2017-06-06 VITALS — BP 126/80 | HR 72 | Temp 97.9°F | Ht 63.75 in | Wt 157.2 lb

## 2017-06-06 DIAGNOSIS — J0100 Acute maxillary sinusitis, unspecified: Secondary | ICD-10-CM

## 2017-06-06 DIAGNOSIS — J019 Acute sinusitis, unspecified: Secondary | ICD-10-CM | POA: Insufficient documentation

## 2017-06-06 MED ORDER — BENZONATATE 200 MG PO CAPS: 200.0000 mg | ORAL_CAPSULE | Freq: Three times a day (TID) | ORAL | 1 refills | Status: DC | PRN

## 2017-06-06 MED ORDER — AMOXICILLIN-POT CLAVULANATE 875-125 MG PO TABS: 1.0000 | ORAL_TABLET | Freq: Two times a day (BID) | ORAL | 0 refills | Status: DC

## 2017-06-06 NOTE — Progress Notes (Signed)
Subjective:    Patient ID: [name], female    DOB: 02-27-1951, 66 y.o.   MRN: [id number]  HPI Here for uri symptoms and facial pain   Started Thursday  ST (scratchy) and then pnd and nasal congestion   R face hurts around her eye - with lots of pressure  Green nasal d/c  Cough started yesterday - some production   Has used afrin  Will get some nasal saline   Was exp to grandchild with "walking pneumonia"  No fever Temp: 97.9 F (36.6 C)   BP: 126/80  Pulse Rate: 72   Patient Active Problem List   Diagnosis Date Noted  . Acute sinusitis 06/06/2017  . Breast cancer screening 05/31/2017  . Hyperglycemia 06/02/2016  . Stress reaction 05/10/2016  . Estrogen deficiency 01/29/2015  . Menopausal disorder 05/03/2014  . Routine general medical examination at a health care facility 02/20/2012  . 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 DEPRESSION 06/20/2008  . GERD 06/20/2008   Past Medical History:  Diagnosis Date  . Depression   . Fatty liver   . Fibrocystic disease of both breasts   . GERD (gastroesophageal reflux disease)   . Hyperglycemia   . 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 Sphincter of Oddi dysfunction    Past Surgical History:  Procedure Laterality Date  . CARPAL TUNNEL RELEASE  08/2008   left  . ERCP    . LEFT HEART CATHETERIZATION WITH CORONARY ANGIOGRAM N/A 06/08/2013   Procedure: LEFT HEART CATHETERIZATION WITH CORONARY ANGIOGRAM;  Surgeon: Ramond Dial, MD;  Location: [facility] CATH LAB;  Service: Cardiovascular;  Laterality: N/A;  . MASTECTOMY     bilateral for severe macrocystic breast   Social History  Substance Use Topics  . Smoking status: Never Smoker  . Smokeless tobacco: Never Used  . Alcohol use 0.0 oz/week     Comment: occ   Family History  Problem Relation Age of Onset  . Hypertension Father   . Cancer Maternal Aunt        Pancreatic  . Diabetes Maternal  Uncle   . Heart disease Maternal Grandmother   . Brain cancer Mother        hospice   Allergies  Allergen Reactions  . Atorvastatin     REACTION: rash  . Ezetimibe     REACTION: muscle aches  . Sulfamethoxazole-Trimethoprim Other (See Comments)    Swelling of nostrils   Current Outpatient Prescriptions on File Prior to Visit  Medication Sig Dispense Refill  . acetaminophen (TYLENOL) 500 MG tablet Take 500 mg by mouth at bedtime.     . ALPRAZolam (XANAX) 0.5 MG tablet TAKE 1 TABLET BY MOUTH EVERY NIGHT AT BEDTIME AS NEEDED. FOR ANXIETY OR SLEEP. 30 tablet 3  . Calcium-Magnesium-Vitamin D (CALCIUM 1200+D3 PO) Take 1 tablet by mouth at bedtime.    . conjugated estrogens (PREMARIN) vaginal cream Quarter applicator qhs 2x/week 75.1 g 12  . cyclobenzaprine (FLEXERIL) 10 MG tablet TAKE ONE (1) TABLET BY MOUTH DAILY AS NEEDED FOR MUSCLE SPASMS AS DIRECTED 90 tablet 1  . diclofenac sodium (VOLTAREN) 1 % GEL APPLY 4 (FOUR) GRAMS TOPICALLY 4 (FOUR) TIMES DAILY 500 g 3  . doxylamine, Sleep, (UNISOM) 25 MG tablet Take 25 mg by mouth at bedtime as needed.    . gabapentin (NEURONTIN) 300 MG capsule Take 1 capsule (300 mg total) by mouth 2 (two) times daily.  If s/s aren't significantly improved you can increase to 300mg  tid. 90 capsule 11  . nitroGLYCERIN (NITROSTAT) 0.4 MG SL tablet Place 1 tablet (0.4 mg total) under the tongue every 5 (five) minutes as needed for chest pain. 100 tablet 3  . pantoprazole (PROTONIX) 40 MG tablet Take 1 tablet (40 mg total) by mouth 2 (two) times daily. 180 tablet 3  . traMADol (ULTRAM) 50 MG tablet TAKE ONE (1) TABLET BY MOUTH EVERY SIX HOURS AS NEEDED 50 tablet 0   No current facility-administered medications on file prior to visit.      Review of Systems  Constitutional: Positive for appetite change and fatigue. Negative for activity change, fever and unexpected weight change.  HENT: Positive for congestion, ear pain, postnasal drip, rhinorrhea, sinus pressure  and sore throat. Negative for nosebleeds.   Eyes: Negative for pain, redness, itching and visual disturbance.  Respiratory: Positive for cough. Negative for shortness of breath and wheezing.   Cardiovascular: Negative for chest pain and palpitations.  Gastrointestinal: Negative for abdominal pain, blood in stool, constipation, diarrhea, nausea and vomiting.  Endocrine: Negative for polydipsia and polyuria.  Genitourinary: Negative for dysuria, frequency and urgency.  Musculoskeletal: Negative for arthralgias, back pain and myalgias.  Skin: Negative for pallor and rash.  Allergic/Immunologic: Negative for environmental allergies and immunocompromised state.  Neurological: Positive for headaches. Negative for dizziness, tremors, syncope, weakness and numbness.  Hematological: Negative for adenopathy. Does not bruise/bleed easily.  Psychiatric/Behavioral: Negative for decreased concentration and dysphoric mood. The patient is not nervous/anxious.        Objective:   Physical Exam  Constitutional: She appears well-developed and well-nourished. No distress.  Well but fatigued appearing   HENT:  Head: Normocephalic and atraumatic.  Right Ear: External ear normal.  Left Ear: External ear normal.  Mouth/Throat: Oropharynx is clear and moist. No oropharyngeal exudate.  Nares are injected and congested  Bilateral maxillary sinus tenderness -worse on the R  Post nasal drip   Eyes: Pupils are equal, round, and reactive to light. Conjunctivae and EOM are normal. Right eye exhibits no discharge. Left eye exhibits no discharge.  Neck: Normal range of motion. Neck supple.  Cardiovascular: Normal rate and regular rhythm.   Pulmonary/Chest: Effort normal and breath sounds normal. No respiratory distress. She has no wheezes. She has no rales.  Lymphadenopathy:    She has no cervical adenopathy.  Neurological: She is alert. No cranial nerve deficit.  Skin: Skin is warm and dry. No rash noted.    Psychiatric: She has a normal mood and affect.          Assessment & Plan:   Problem List Items Addressed This Visit      Respiratory   Acute sinusitis    With R sided facial pain and purulent nasal drainage Disc symptomatic care - see instructions on AVS  Cover with augmentin  Px tessalon for cough prn  Update if not starting to improve in a week or if worsening        Relevant Medications   amoxicillin-clavulanate (AUGMENTIN) 875-125 MG tablet   benzonatate (TESSALON) 200 MG capsule

## 2017-06-06 NOTE — Assessment & Plan Note (Signed)
With R sided facial pain and purulent nasal drainage Disc symptomatic care - see instructions on AVS  Cover with augmentin  Px tessalon for cough prn  Update if not starting to improve in a week or if worsening

## 2017-06-06 NOTE — Patient Instructions (Addendum)
We will treat you for a sinus infection  Take the augmentin as directed Lots of fluids and rest  Steam / warm compresses help facial pressure   Nasal saline helps congestion  Also flonase over the counter   Tessalon for cough    Update if not starting to improve in a week or if worsening

## 2017-06-10 DIAGNOSIS — H2513 Age-related nuclear cataract, bilateral: Secondary | ICD-10-CM | POA: Diagnosis not present

## 2017-06-29 ENCOUNTER — Other Ambulatory Visit: Payer: Self-pay | Admitting: Family Medicine

## 2017-06-30 NOTE — Telephone Encounter (Signed)
Last refill 03/04/17 #30 +3  Last OV 06/06/17 Ok to refill?

## 2017-06-30 NOTE — Telephone Encounter (Signed)
Px written for call in   

## 2017-06-30 NOTE — Telephone Encounter (Signed)
Called Rx to pharmacy/thx dmf

## 2017-08-11 ENCOUNTER — Encounter: Payer: Self-pay | Admitting: Family Medicine

## 2017-08-11 ENCOUNTER — Telehealth: Payer: Self-pay

## 2017-08-11 ENCOUNTER — Ambulatory Visit (INDEPENDENT_AMBULATORY_CARE_PROVIDER_SITE_OTHER): Payer: Medicare Other | Admitting: Family Medicine

## 2017-08-11 VITALS — BP 122/64 | HR 66 | Temp 98.4°F | Ht 63.75 in | Wt 161.8 lb

## 2017-08-11 DIAGNOSIS — L989 Disorder of the skin and subcutaneous tissue, unspecified: Secondary | ICD-10-CM | POA: Diagnosis not present

## 2017-08-11 MED ORDER — MOMETASONE FUROATE 0.1 % EX CREA: 1.0000 "application " | TOPICAL_CREAM | Freq: Every day | CUTANEOUS | 0 refills | Status: DC

## 2017-08-11 NOTE — Telephone Encounter (Signed)
I spoke with Harmon Pier at [facility] and notified as instructed by Dr Glori Bickers. Landon voiced understanding and will let Bea know.

## 2017-08-11 NOTE — Progress Notes (Signed)
Subjective:    Patient ID: [name], female    DOB: Oct 31, 1950, 67 y.o.   MRN: [id number]  HPI Here for skin lesion with itching (spot on her chest)  2 weeks  Red and itchy  Raised  Asymmetric  No trauma or insect bite  She put abx ointment on it at first (no change)   No drainage or weeping  Feels a little crusty    Wt Readings from Last 3 Encounters:  08/11/17 161 lb 12 oz (73.4 kg)  06/06/17 157 lb 4 oz (71.3 kg)  05/31/17 157 lb 8 oz (71.4 kg)     Lab Results  Component Value Date   HGBA1C 6.3 05/26/2017   H/o hyperglycemia  Did not know if she should check blood glucose   Patient Active Problem List   Diagnosis Date Noted  . Skin lesion 08/11/2017  . Acute sinusitis 06/06/2017  . Breast cancer screening 05/31/2017  . Hyperglycemia 06/02/2016  . Stress reaction 05/10/2016  . Estrogen deficiency 01/29/2015  . Menopausal disorder 05/03/2014  . Routine general medical examination at a health care facility 02/20/2012  . 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 DEPRESSION 06/20/2008  . GERD 06/20/2008   Past Medical History:  Diagnosis Date  . Depression   . Fatty liver   . Fibrocystic disease of both breasts   . GERD (gastroesophageal reflux disease)   . Hyperglycemia   . 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 Sphincter of Oddi dysfunction    Past Surgical History:  Procedure Laterality Date  . CARPAL TUNNEL RELEASE  08/2008   left  . ERCP    . LEFT HEART CATHETERIZATION WITH CORONARY ANGIOGRAM N/A 06/08/2013   Procedure: LEFT HEART CATHETERIZATION WITH CORONARY ANGIOGRAM;  Surgeon: Ramond Dial, MD;  Location: [facility] CATH LAB;  Service: Cardiovascular;  Laterality: N/A;  . MASTECTOMY     bilateral for severe macrocystic breast   Social History   Tobacco Use  . Smoking status: Never Smoker  . Smokeless tobacco: Never Used  Substance Use Topics  . Alcohol use: Yes   Alcohol/week: 0.0 oz    Comment: occ  . Drug use: No   Family History  Problem Relation Age of Onset  . Hypertension Father   . Cancer Maternal Aunt        Pancreatic  . Diabetes Maternal Uncle   . Heart disease Maternal Grandmother   . Brain cancer Mother        hospice   Allergies  Allergen Reactions  . Atorvastatin     REACTION: rash  . Ezetimibe     REACTION: muscle aches  . Sulfamethoxazole-Trimethoprim Other (See Comments)    Swelling of nostrils   Current Outpatient Medications on File Prior to Visit  Medication Sig Dispense Refill  . acetaminophen (TYLENOL) 500 MG tablet Take 500 mg by mouth at bedtime.     . ALPRAZolam (XANAX) 0.5 MG tablet TAKE ONE TABLET BY MOUTH AT BEDTIME AS NEEDED FOR ANXIETY OR SLEEP 30 tablet 3  . Calcium-Magnesium-Vitamin D (CALCIUM 1200+D3 PO) Take 1 tablet by mouth at bedtime.    . conjugated estrogens (PREMARIN) vaginal cream Quarter applicator qhs 2x/week 62.9 g 12  . diclofenac sodium (VOLTAREN) 1 % GEL APPLY 4 (FOUR) GRAMS TOPICALLY 4 (FOUR) TIMES DAILY 500 g 3  . doxylamine, Sleep, (UNISOM) 25 MG tablet Take 25 mg by mouth at bedtime as needed.    Marland Kitchen  gabapentin (NEURONTIN) 300 MG capsule Take 1 capsule by mouth 3 (three) times daily as needed.     . nitroGLYCERIN (NITROSTAT) 0.4 MG SL tablet Place 1 tablet (0.4 mg total) under the tongue every 5 (five) minutes as needed for chest pain. 100 tablet 3  . pantoprazole (PROTONIX) 40 MG tablet Take 1 tablet (40 mg total) by mouth 2 (two) times daily. 180 tablet 3  . benzonatate (TESSALON) 200 MG capsule Take 1 capsule (200 mg total) by mouth 3 (three) times daily as needed. Swallow whole, do not bite pill (Patient not taking: Reported on 08/11/2017) 30 capsule 1   No current facility-administered medications on file prior to visit.      Review of Systems  Constitutional: Negative for activity change, appetite change, fatigue, fever and unexpected weight change.  HENT: Negative for congestion,  ear pain, rhinorrhea, sinus pressure and sore throat.   Eyes: Negative for pain, redness and visual disturbance.  Respiratory: Negative for cough, shortness of breath and wheezing.   Cardiovascular: Negative for chest pain and palpitations.  Gastrointestinal: Negative for abdominal pain, blood in stool, constipation and diarrhea.  Endocrine: Negative for polydipsia and polyuria.  Genitourinary: Negative for dysuria, frequency and urgency.  Musculoskeletal: Negative for arthralgias, back pain and myalgias.  Skin: Negative for pallor and rash.       Pos for skin lesion   Allergic/Immunologic: Negative for environmental allergies.  Neurological: Negative for dizziness, syncope and headaches.  Hematological: Negative for adenopathy. Does not bruise/bleed easily.  Psychiatric/Behavioral: Negative for decreased concentration and dysphoric mood. The patient is not nervous/anxious.        Objective:   Physical Exam  Constitutional: She appears well-developed and well-nourished. No distress.  Well appearing   HENT:  Head: Normocephalic and atraumatic.  Mouth/Throat: Oropharynx is clear and moist.  Eyes: Conjunctivae and EOM are normal. Pupils are equal, round, and reactive to light. No scleral icterus.  Neck: Normal range of motion. Neck supple.  Cardiovascular: Normal rate and regular rhythm.  Lymphadenopathy:    She has no cervical adenopathy.  Neurological: She is alert. No cranial nerve deficit.  Skin: Skin is warm and dry.  1 cm raised erythematous skin lesion with scant scale and irregular borders on mid chest   Solar lentigines diffusely  No other lesions     Psychiatric: She has a normal mood and affect.          Assessment & Plan:   Problem List Items Addressed This Visit      Musculoskeletal and Integument   Skin lesion    ? If neoplasm of uncertain significance Irritated features of scale and erythema  tx with mometasone cream  Ref to derm for eval given  irregular borders and atypical appearance       Relevant Orders   Ambulatory referral to Dermatology

## 2017-08-11 NOTE — Patient Instructions (Signed)
If you want a glucometer and strips call your insurance to see if covered and exactly what brand and let me know   Use the mometasone cream for spot on chest  Keep clean with soap and water We will refer you to dermatology

## 2017-08-11 NOTE — Telephone Encounter (Signed)
Please give the ok to sub that /ointment is fine

## 2017-08-11 NOTE — Telephone Encounter (Signed)
Bea at [facility] called and does not have mometasone cream; can order but pt wants to pick up today and wants to know if can substitute mometasone ointment?

## 2017-08-11 NOTE — Assessment & Plan Note (Signed)
?   If neoplasm of uncertain significance Irritated features of scale and erythema  tx with mometasone cream  Ref to derm for eval given irregular borders and atypical appearance

## 2017-08-19 DIAGNOSIS — L820 Inflamed seborrheic keratosis: Secondary | ICD-10-CM | POA: Diagnosis not present

## 2017-08-19 DIAGNOSIS — D2271 Melanocytic nevi of right lower limb, including hip: Secondary | ICD-10-CM | POA: Diagnosis not present

## 2017-08-19 DIAGNOSIS — Z1283 Encounter for screening for malignant neoplasm of skin: Secondary | ICD-10-CM | POA: Diagnosis not present

## 2017-08-19 DIAGNOSIS — D225 Melanocytic nevi of trunk: Secondary | ICD-10-CM | POA: Diagnosis not present

## 2017-08-19 DIAGNOSIS — D485 Neoplasm of uncertain behavior of skin: Secondary | ICD-10-CM | POA: Diagnosis not present

## 2017-09-09 DIAGNOSIS — Z1231 Encounter for screening mammogram for malignant neoplasm of breast: Secondary | ICD-10-CM | POA: Diagnosis not present

## 2017-09-21 ENCOUNTER — Encounter: Payer: Self-pay | Admitting: Family Medicine

## 2017-09-21 ENCOUNTER — Ambulatory Visit (INDEPENDENT_AMBULATORY_CARE_PROVIDER_SITE_OTHER): Payer: Medicare Other | Admitting: Family Medicine

## 2017-09-21 VITALS — BP 128/74 | HR 81 | Wt 160.0 lb

## 2017-09-21 DIAGNOSIS — Z01419 Encounter for gynecological examination (general) (routine) without abnormal findings: Secondary | ICD-10-CM | POA: Diagnosis not present

## 2017-09-21 DIAGNOSIS — N959 Unspecified menopausal and perimenopausal disorder: Secondary | ICD-10-CM

## 2017-09-21 MED ORDER — ESTROGENS, CONJUGATED 0.625 MG/GM VA CREA: TOPICAL_CREAM | VAGINAL | 12 refills | Status: DC

## 2017-09-21 MED ORDER — GABAPENTIN 300 MG PO CAPS: 300.0000 mg | ORAL_CAPSULE | Freq: Three times a day (TID) | ORAL | 2 refills | Status: DC | PRN

## 2017-09-21 NOTE — Progress Notes (Signed)
   GYNECOLOGY ANNUAL PREVENTATIVE CARE ENCOUNTER NOTE  Subjective:   [name] is a 67 y.o. [phone] female here for a a problem GYN visit.  Current complaints: wants to have her vagina checked, recently had a mole removed on her toe and was atypical cells.   Denies abnormal vaginal bleeding, discharge, pelvic pain, problems with intercourse or other gynecologic concerns.    Gynecologic History No LMP recorded. Patient has had a hysterectomy. Last Pap: NA Last mammogram: 1/22- awaiting results  The following portions of the patient's history were reviewed and updated as appropriate: allergies, current medications, past family history, past medical history, past social history, past surgical history and problem list.  Review of Systems Pertinent items are noted in HPI.   Objective:  There were no vitals taken for this visit. CONSTITUTIONAL: Well-developed, well-nourished female in no acute distress.  HENT:  Normocephalic, atraumatic, External right and left ear normal. Oropharynx is clear and moist EYES:  No scleral icterus.  NECK: Normal range of motion, supple, no masses.  Normal thyroid.  SKIN: Skin is warm and dry. No rash noted. Not diaphoretic. No erythema. No pallor. NEUROLOGIC: Alert and oriented to person, place, and time. Normal reflexes, muscle tone coordination. No cranial nerve deficit noted. PSYCHIATRIC: Normal mood and affect. Normal behavior. Normal judgment and thought content. CARDIOVASCULAR: Normal heart rate noted, regular rhythm. 2+ distal pulses. RESPIRATORY: Effort and breath sounds normal, no problems with respiration noted. BREASTS: Symmetric in size. No masses, skin changes, nipple drainage, or lymphadenopathy.- implants present ABDOMEN: Soft,  no distention noted.  No tenderness, rebound or guarding.  PELVIC: Normal appearing external genitalia- except for no labia minora on the right likely due to obstetrical laceration/appear; normal appearing vaginal  mucosa.  No abnormal discharge noted.   MUSCULOSKELETAL: Normal range of motion.   Assessment and Plan:  1. Well woman exam with routine gynecological exam - normal exam - HCM up to date - conjugated estrogens (PREMARIN) vaginal cream; Quarter applicator qhs 2x/week  Dispense: 42.5 g; Refill: 12 - gabapentin (NEURONTIN) 300 MG capsule; Take 1 capsule (300 mg total) by mouth 3 (three) times daily as needed.  Dispense: 90 capsule; Refill: 2  2. Menopausal disorder - doing well with premarin, helps with vaginal lubrication  - conjugated estrogens (PREMARIN) vaginal cream; Quarter applicator qhs 2x/week  Dispense: 42.5 g; Refill: 12 - gabapentin (NEURONTIN) 300 MG capsule; Take 1 capsule (300 mg total) by mouth 3 (three) times daily as needed.  Dispense: 90 capsule; Refill: 2  >50% of this 25 min visit was spent in direct counseling regarding menopause and treatment.   Please refer to After Visit Summary for other counseling recommendations.   Return in about 1 year (around 09/21/2018) for Yearly wellness exam.  Caren Macadam, MD, MPH, ABFM Attending [place] for [facility]

## 2017-09-27 ENCOUNTER — Ambulatory Visit (INDEPENDENT_AMBULATORY_CARE_PROVIDER_SITE_OTHER): Payer: Medicare Other | Admitting: Family Medicine

## 2017-09-27 ENCOUNTER — Ambulatory Visit: Payer: Self-pay | Admitting: *Deleted

## 2017-09-27 ENCOUNTER — Encounter: Payer: Self-pay | Admitting: Family Medicine

## 2017-09-27 VITALS — BP 124/66 | HR 81 | Temp 98.1°F | Ht 63.75 in | Wt 160.5 lb

## 2017-09-27 DIAGNOSIS — M546 Pain in thoracic spine: Secondary | ICD-10-CM

## 2017-09-27 NOTE — Telephone Encounter (Signed)
Pt called with complaints of left breast pain; the pain goes from nipple to her back; saline implants 7 years ago; she states that she had a mammogram around the September 11, 2017 and this is when she noticed the pain after this; she feels is pulling down on the muscle; nurse triage initiated and recommendation made for pt to see physician within 3 days; she did request an appointment today but none available I office;  pt offered and accepted appointment with Dr Glori Bickers 09/28/17 at 1600; she verbalizes understanding; will route to [place] for possibility of changing pt's appointment; she can be reached on her cell phone [phone].     Reason for Disposition . Change in shape or appearance of breast  Answer Assessment - Initial Assessment Questions 1. SYMPTOM: "What's the main symptom you're concerned about?"  (e.g., lump, pain, rash, nipple discharge)    Pain, ? Leaking implant  2. LOCATION: "Where is the _______ located?"     Left breast 3. ONSET: "When did ________  start?"    2 weeks ago 4. PRIOR HISTORY: "Do you have any history of prior problems with your breasts?" (e.g., lumps, cancer, fibrocystic breast disease)     bil subcutaneous masectomy 30 years ago 5. CAUSE: "What do you think is causing this symptom?"     ? Leaking breast implant 6. OTHER SYMPTOMS: "Do you have any other symptoms?" (e.g., fever, breast pain, redness or rash, nipple discharge)    Back pain rated 8-9 out of 10; described as shooting 7. PREGNANCY-BREASTFEEDING: "Is there any chance you are pregnant?" "When was your last menstrual period?" "Are you breastfeeding?"     no  Protocols used: BREAST [facility]

## 2017-09-27 NOTE — Telephone Encounter (Signed)
Has appt today  Will see her then

## 2017-09-27 NOTE — Patient Instructions (Signed)
I think you have a back/neck spasm that may refer pain around to the breast  Exam is reassuring- but watch for a rash Use gentle heat for 10 minutes at time on and off  Try the flexeril as directed (watch out for sedation) Tylenol is also ok  Do some gentle stretching if not too painful  I will wait to see the mammogram report   Update me if any changes

## 2017-09-27 NOTE — Telephone Encounter (Signed)
Can patient be worked in sooner?

## 2017-09-27 NOTE — Progress Notes (Signed)
Subjective:    Patient ID: [name], female    DOB: 1951-08-30, 67 y.o.   MRN: [id number]  HPI Here for breast pain   Last Thursday her neck/upper back and chest were sore  Hurt to take a deep breath (but pulse ox was fine)  Last night- pain from L mid back all the way around to her breast (fairly bad)   Tight feeling and pain both today   Took tylenol   No new numbness/tingling   Symptoms worsen to turn her head to the left  Also worse to walk  Better to stay perfectly still   Exercise- using treadmill  No weight lifting  No trauma or accidents    Pt had bilateral mastectomy in the past for fibrocystic breasts in setting of family hx of breast cancer  Left 10% breast tissue   Had saline implants 7 years ago (after bilateral mastectomy) Mammogram was 09/11/17  (they had a hard time with the L one - pulled excessively)  [place] it at [place] pain after this "pulling down into the muscle"-was worried about a leak Still pending report   This is how it used to feel to fill her expanders when she had the first implants   No rash at all   Wt Readings from Last 3 Encounters:  09/27/17 160 lb 8 oz (72.8 kg)  09/21/17 160 lb (72.6 kg)  08/11/17 161 lb 12 oz (73.4 kg)    Patient Active Problem List   Diagnosis Date Noted  . Thoracic back pain 09/27/2017  . Skin lesion 08/11/2017  . Breast cancer screening 05/31/2017  . Hyperglycemia 06/02/2016  . Stress reaction 05/10/2016  . Estrogen deficiency 01/29/2015  . Menopausal disorder 05/03/2014  . Routine general medical examination at a health care facility 02/20/2012  . DEPRESSION 06/20/2008  . GERD 06/20/2008   Past Medical History:  Diagnosis Date  . Depression   . Fatty liver   . Fibrocystic disease of both breasts   . GERD (gastroesophageal reflux disease)   . Hyperglycemia   . 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 Sphincter of Oddi dysfunction     Past Surgical History:  Procedure Laterality Date  . ABDOMINAL HYSTERECTOMY    . CARPAL TUNNEL RELEASE  08/2008   left  . ERCP    . LEFT HEART CATHETERIZATION WITH CORONARY ANGIOGRAM N/A 06/08/2013   Procedure: LEFT HEART CATHETERIZATION WITH CORONARY ANGIOGRAM;  Surgeon: Ramond Dial, MD;  Location: [facility] CATH LAB;  Service: Cardiovascular;  Laterality: N/A;  . MASTECTOMY     bilateral for severe macrocystic breast   Social History   Tobacco Use  . Smoking status: Never Smoker  . Smokeless tobacco: Never Used  Substance Use Topics  . Alcohol use: Yes    Alcohol/week: 0.0 oz    Comment: occ  . Drug use: No   Family History  Problem Relation Age of Onset  . Hypertension Father   . Cancer Father        prostate and leukemia  . Cancer Maternal Aunt        Pancreatic  . Diabetes Maternal Uncle   . Heart disease Maternal Grandmother   . Brain cancer Mother        hospice  . Breast cancer Sister 26   Allergies  Allergen Reactions  . Atorvastatin     REACTION: rash  . Ezetimibe  REACTION: muscle aches  . Sulfamethoxazole-Trimethoprim Other (See Comments)    Swelling of nostrils   Current Outpatient Medications on File Prior to Visit  Medication Sig Dispense Refill  . acetaminophen (TYLENOL) 500 MG tablet Take 500 mg by mouth at bedtime.     . ALPRAZolam (XANAX) 0.5 MG tablet TAKE ONE TABLET BY MOUTH AT BEDTIME AS NEEDED FOR ANXIETY OR SLEEP 30 tablet 3  . Calcium-Magnesium-Vitamin D (CALCIUM 1200+D3 PO) Take 1 tablet by mouth at bedtime.    . conjugated estrogens (PREMARIN) vaginal cream Quarter applicator qhs 2x/week 50.3 g 12  . cyclobenzaprine (FLEXERIL) 10 MG tablet     . diclofenac sodium (VOLTAREN) 1 % GEL APPLY 4 (FOUR) GRAMS TOPICALLY 4 (FOUR) TIMES DAILY 500 g 3  . doxylamine, Sleep, (UNISOM) 25 MG tablet Take 25 mg by mouth at bedtime as needed.    . gabapentin (NEURONTIN) 300 MG capsule Take 1 capsule (300 mg total) by mouth 3 (three) times daily as  needed. 90 capsule 2  . mometasone (ELOCON) 0.1 % cream Apply 1 application topically daily. Tiny amount to affected area once daily 15 g 0  . mometasone (ELOCON) 0.1 % ointment     . nitroGLYCERIN (NITROSTAT) 0.4 MG SL tablet Place 1 tablet (0.4 mg total) under the tongue every 5 (five) minutes as needed for chest pain. 100 tablet 3  . pantoprazole (PROTONIX) 40 MG tablet Take 1 tablet (40 mg total) by mouth 2 (two) times daily. 180 tablet 3   No current facility-administered medications on file prior to visit.     Review of Systems  Constitutional: Negative for activity change, appetite change, fatigue, fever and unexpected weight change.  HENT: Negative for congestion, ear pain, rhinorrhea, sinus pressure and sore throat.   Eyes: Negative for pain, redness and visual disturbance.  Respiratory: Negative for cough, shortness of breath and wheezing.   Cardiovascular: Negative for chest pain and palpitations.  Gastrointestinal: Negative for abdominal pain, blood in stool, constipation and diarrhea.  Endocrine: Negative for polydipsia and polyuria.  Genitourinary: Negative for dysuria, frequency and urgency.  Musculoskeletal: Positive for back pain, myalgias and neck pain. Negative for arthralgias and neck stiffness.  Skin: Negative for pallor and rash.  Allergic/Immunologic: Negative for environmental allergies.  Neurological: Negative for dizziness, syncope and headaches.  Hematological: Negative for adenopathy. Does not bruise/bleed easily.  Psychiatric/Behavioral: Negative for decreased concentration and dysphoric mood. The patient is not nervous/anxious.        Objective:   Physical Exam  Constitutional: She appears well-developed and well-nourished. No distress.  Well appearing   HENT:  Head: Normocephalic and atraumatic.  Mouth/Throat: Oropharynx is clear and moist.  Eyes: Conjunctivae and EOM are normal. Pupils are equal, round, and reactive to light. No scleral icterus.    Neck: Normal range of motion. Neck supple. No thyromegaly present.  Pain on full flexion of neck and L tilt -but rom is full See MSK exam  Cardiovascular: Normal rate, regular rhythm and normal heart sounds.  Pulmonary/Chest: Effort normal and breath sounds normal. No respiratory distress. She has no wheezes. She has no rales. She exhibits no tenderness.  No rib tenderness  Abdominal: Soft. Bowel sounds are normal. She exhibits no distension. There is no tenderness. There is no rebound and no guarding.  Genitourinary:  Genitourinary Comments: Breast exam: No mass, nodules, thickening, tenderness, bulging, retraction, inflamation, nipple discharge or skin changes noted.  No axillary or clavicular LA.     Bilateral saline implants-no evidence of  rupture or asymmetry  Musculoskeletal: She exhibits tenderness. She exhibits no edema.       Cervical back: She exhibits tenderness and spasm. She exhibits normal range of motion, no bony tenderness, no edema and no deformity.       Thoracic back: She exhibits tenderness and spasm. She exhibits normal range of motion, no bony tenderness, no edema and no deformity.  Tender over rhomboid area on the L  No bony tenderness  Neck tilt to L is painful along with full flexion Nl rom of UEs bilaterally   No neuro changes   Lymphadenopathy:    She has no cervical adenopathy.  Neurological: She is alert. No cranial nerve deficit. She exhibits normal muscle tone. Coordination normal.  Skin: Skin is warm and dry. No rash noted. No erythema. No pallor.  Psychiatric: She has a normal mood and affect.          Assessment & Plan:   Problem List Items Addressed This Visit      Other   Thoracic back pain - Primary    L sided with neck and chest wall pain/ rad to breast  No rash-will watch for that closely  Reassuring breast exam with saline implants Recommend heat at 10 min intervals Stretching/fitness Flexeril prn with caution of sedation  Consider  PT if not imp  Reassuring exam  Rev last mammogram report also

## 2017-09-28 ENCOUNTER — Ambulatory Visit: Payer: Medicare Other | Admitting: Family Medicine

## 2017-09-29 NOTE — Assessment & Plan Note (Signed)
L sided with neck and chest wall pain/ rad to breast  No rash-will watch for that closely  Reassuring breast exam with saline implants Recommend heat at 10 min intervals Stretching/fitness Flexeril prn with caution of sedation  Consider PT if not imp  Reassuring exam  Rev last mammogram report also

## 2017-10-14 ENCOUNTER — Ambulatory Visit (INDEPENDENT_AMBULATORY_CARE_PROVIDER_SITE_OTHER): Payer: Medicare Other | Admitting: Family Medicine

## 2017-10-14 ENCOUNTER — Encounter: Payer: Self-pay | Admitting: Family Medicine

## 2017-10-14 VITALS — BP 130/74 | HR 86 | Temp 98.1°F | Ht 63.75 in | Wt 160.1 lb

## 2017-10-14 DIAGNOSIS — J209 Acute bronchitis, unspecified: Secondary | ICD-10-CM

## 2017-10-14 MED ORDER — GUAIFENESIN-CODEINE 100-10 MG/5ML PO SYRP: 5.0000 mL | ORAL_SOLUTION | Freq: Three times a day (TID) | ORAL | 0 refills | Status: DC | PRN

## 2017-10-14 MED ORDER — BENZONATATE 200 MG PO CAPS: 200.0000 mg | ORAL_CAPSULE | Freq: Three times a day (TID) | ORAL | 1 refills | Status: DC | PRN

## 2017-10-14 MED ORDER — PREDNISONE 10 MG PO TABS: ORAL_TABLET | ORAL | 0 refills | Status: DC

## 2017-10-14 MED ORDER — DOXYCYCLINE HYCLATE 100 MG PO TABS: 100.0000 mg | ORAL_TABLET | Freq: Two times a day (BID) | ORAL | 0 refills | Status: DC

## 2017-10-14 NOTE — Patient Instructions (Addendum)
Drink lots of fluids and rest  Tessalon three times daily  Use the codeine cough syrup if not working/driving Use mucinex DM if working /driving   Take doxycycline and prednisone for bronchitis  Update if not starting to improve in a week or if worsening

## 2017-10-14 NOTE — Progress Notes (Signed)
Subjective:    Patient ID: [name], female    DOB: 1951-04-02, 67 y.o.   MRN: [id number]  HPI Here for a cough   Started 1 week ago  Prod of yellow/green sputum  No nasal symptoms  Worse at night   Chest feels a little tight  Some wheeze   ST from cough  Chest is sore from cough   Delsym  Leftover tessalon  Temp: 98.1 F (36.7 C)  Pulse ox 97%  No fever   Patient Active Problem List   Diagnosis Date Noted  . Acute bronchitis 10/14/2017  . Thoracic back pain 09/27/2017  . Skin lesion 08/11/2017  . Breast cancer screening 05/31/2017  . Hyperglycemia 06/02/2016  . Stress reaction 05/10/2016  . Estrogen deficiency 01/29/2015  . Menopausal disorder 05/03/2014  . Routine general medical examination at a health care facility 02/20/2012  . DEPRESSION 06/20/2008  . GERD 06/20/2008   Past Medical History:  Diagnosis Date  . Depression   . Fatty liver   . Fibrocystic disease of both breasts   . GERD (gastroesophageal reflux disease)   . Hyperglycemia   . 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 Sphincter of Oddi dysfunction    Past Surgical History:  Procedure Laterality Date  . ABDOMINAL HYSTERECTOMY    . CARPAL TUNNEL RELEASE  08/2008   left  . ERCP    . LEFT HEART CATHETERIZATION WITH CORONARY ANGIOGRAM N/A 06/08/2013   Procedure: LEFT HEART CATHETERIZATION WITH CORONARY ANGIOGRAM;  Surgeon: Ramond Dial, MD;  Location: [facility] CATH LAB;  Service: Cardiovascular;  Laterality: N/A;  . MASTECTOMY     bilateral for severe macrocystic breast   Social History   Tobacco Use  . Smoking status: Never Smoker  . Smokeless tobacco: Never Used  Substance Use Topics  . Alcohol use: Yes    Alcohol/week: 0.0 oz    Comment: occ  . Drug use: No   Family History  Problem Relation Age of Onset  . Hypertension Father   . Cancer Father        prostate and leukemia  . Cancer Maternal Aunt        Pancreatic    . Diabetes Maternal Uncle   . Heart disease Maternal Grandmother   . Brain cancer Mother        hospice  . Breast cancer Sister 62   Allergies  Allergen Reactions  . Atorvastatin     REACTION: rash  . Ezetimibe     REACTION: muscle aches  . Sulfamethoxazole-Trimethoprim Other (See Comments)    Swelling of nostrils   Current Outpatient Medications on File Prior to Visit  Medication Sig Dispense Refill  . acetaminophen (TYLENOL) 500 MG tablet Take 500 mg by mouth at bedtime.     . ALPRAZolam (XANAX) 0.5 MG tablet TAKE ONE TABLET BY MOUTH AT BEDTIME AS NEEDED FOR ANXIETY OR SLEEP 30 tablet 3  . Calcium-Magnesium-Vitamin D (CALCIUM 1200+D3 PO) Take 1 tablet by mouth at bedtime.    . conjugated estrogens (PREMARIN) vaginal cream Quarter applicator qhs 2x/week 03.4 g 12  . cyclobenzaprine (FLEXERIL) 10 MG tablet     . diclofenac sodium (VOLTAREN) 1 % GEL APPLY 4 (FOUR) GRAMS TOPICALLY 4 (FOUR) TIMES DAILY 500 g 3  . doxylamine, Sleep, (UNISOM) 25 MG tablet Take 25 mg by mouth at bedtime as needed.    . gabapentin (NEURONTIN) 300 MG capsule Take 1 capsule (  300 mg total) by mouth 3 (three) times daily as needed. 90 capsule 2  . mometasone (ELOCON) 0.1 % ointment     . nitroGLYCERIN (NITROSTAT) 0.4 MG SL tablet Place 1 tablet (0.4 mg total) under the tongue every 5 (five) minutes as needed for chest pain. 100 tablet 3  . pantoprazole (PROTONIX) 40 MG tablet Take 1 tablet (40 mg total) by mouth 2 (two) times daily. 180 tablet 3   No current facility-administered medications on file prior to visit.     Review of Systems  Constitutional: Positive for fatigue. Negative for activity change, appetite change, fever and unexpected weight change.  HENT: Positive for voice change. Negative for congestion, ear pain, postnasal drip, rhinorrhea, sinus pressure, sinus pain, sneezing and sore throat.   Eyes: Negative for pain, redness and visual disturbance.  Respiratory: Positive for cough. Negative  for shortness of breath, wheezing and stridor.   Cardiovascular: Negative for chest pain and palpitations.  Gastrointestinal: Negative for abdominal pain, blood in stool, constipation and diarrhea.  Endocrine: Negative for polydipsia and polyuria.  Genitourinary: Negative for dysuria, frequency and urgency.  Musculoskeletal: Negative for arthralgias, back pain and myalgias.  Skin: Negative for pallor and rash.  Allergic/Immunologic: Negative for environmental allergies.  Neurological: Negative for dizziness, syncope and headaches.  Hematological: Negative for adenopathy. Does not bruise/bleed easily.  Psychiatric/Behavioral: Negative for decreased concentration and dysphoric mood. The patient is not nervous/anxious.        Objective:   Physical Exam  Constitutional: She appears well-developed and well-nourished. No distress.  Well appearing with cough  HENT:  Head: Normocephalic and atraumatic.  Right Ear: External ear normal.  Left Ear: External ear normal.  Mouth/Throat: Oropharynx is clear and moist.  Nares are boggy No sinus tenderness Clear rhinorrhea and post nasal drip -scant  Eyes: Conjunctivae and EOM are normal. Pupils are equal, round, and reactive to light. Right eye exhibits no discharge. Left eye exhibits no discharge.  Neck: Normal range of motion. Neck supple.  Cardiovascular: Normal rate and normal heart sounds.  Pulmonary/Chest: Effort normal and breath sounds normal. No respiratory distress. She has no wheezes. She has no rales. She exhibits no tenderness.  Good air exch Scant wheeze on forced exp only and harsh bs  No rales  Few scattered rhonchi  Lymphadenopathy:    She has no cervical adenopathy.  Neurological: She is alert.  Skin: Skin is warm and dry. No rash noted.  Psychiatric: She has a normal mood and affect.          Assessment & Plan:   Problem List Items Addressed This Visit      Respiratory   Acute bronchitis - Primary    Cover with  doxycycline given length of illness and worsening  Prednisone taper Disc symptomatic care - see instructions on AVS  Tessalon guifen -codeine when not working or driving Reassuring exam Disc symptomatic care - see instructions on AVS  Update if not starting to improve in a week or if worsening    Meds ordered this encounter  Medications  . predniSONE (DELTASONE) 10 MG tablet    Sig: Take 3 pills once daily by mouth for 3 days, then 2 pills once daily for 3 days, then 1 pill once daily for 3 days and then stop    Dispense:  18 tablet    Refill:  0  . doxycycline (VIBRA-TABS) 100 MG tablet    Sig: Take 1 tablet (100 mg total) by mouth 2 (two) times  daily.    Dispense:  14 tablet    Refill:  0  . benzonatate (TESSALON) 200 MG capsule    Sig: Take 1 capsule (200 mg total) by mouth 3 (three) times daily as needed for cough. Do not bite pill    Dispense:  30 capsule    Refill:  1  . guaiFENesin-codeine (ROBITUSSIN AC) 100-10 MG/5ML syrup    Sig: Take 5 mLs by mouth 3 (three) times daily as needed for cough (when not workig or driving).    Dispense:  100 mL    Refill:  0

## 2017-10-14 NOTE — Assessment & Plan Note (Signed)
Cover with doxycycline given length of illness and worsening  Prednisone taper Disc symptomatic care - see instructions on AVS  Tessalon guifen -codeine when not working or driving Reassuring exam Disc symptomatic care - see instructions on AVS  Update if not starting to improve in a week or if worsening    Meds ordered this encounter  Medications  . predniSONE (DELTASONE) 10 MG tablet    Sig: Take 3 pills once daily by mouth for 3 days, then 2 pills once daily for 3 days, then 1 pill once daily for 3 days and then stop    Dispense:  18 tablet    Refill:  0  . doxycycline (VIBRA-TABS) 100 MG tablet    Sig: Take 1 tablet (100 mg total) by mouth 2 (two) times daily.    Dispense:  14 tablet    Refill:  0  . benzonatate (TESSALON) 200 MG capsule    Sig: Take 1 capsule (200 mg total) by mouth 3 (three) times daily as needed for cough. Do not bite pill    Dispense:  30 capsule    Refill:  1  . guaiFENesin-codeine (ROBITUSSIN AC) 100-10 MG/5ML syrup    Sig: Take 5 mLs by mouth 3 (three) times daily as needed for cough (when not workig or driving).    Dispense:  100 mL    Refill:  0

## 2017-10-28 ENCOUNTER — Other Ambulatory Visit: Payer: Self-pay | Admitting: Family Medicine

## 2017-10-28 NOTE — Telephone Encounter (Signed)
Will refill electronically  

## 2017-10-28 NOTE — Telephone Encounter (Signed)
CPE was on 05/31/17, and pt has had a few recent appt after that. Last filled on 06/30/17 #30 tabs with 3 additional refills

## 2017-11-29 ENCOUNTER — Encounter: Payer: Self-pay | Admitting: Family Medicine

## 2017-12-08 DIAGNOSIS — Z1283 Encounter for screening for malignant neoplasm of skin: Secondary | ICD-10-CM | POA: Diagnosis not present

## 2017-12-08 DIAGNOSIS — L258 Unspecified contact dermatitis due to other agents: Secondary | ICD-10-CM | POA: Diagnosis not present

## 2018-02-06 ENCOUNTER — Encounter: Payer: Self-pay | Admitting: Family Medicine

## 2018-02-06 ENCOUNTER — Ambulatory Visit (INDEPENDENT_AMBULATORY_CARE_PROVIDER_SITE_OTHER): Payer: Medicare Other | Admitting: Family Medicine

## 2018-02-06 VITALS — BP 122/64 | HR 83 | Temp 98.5°F | Ht 63.75 in | Wt 160.5 lb

## 2018-02-06 DIAGNOSIS — S40861A Insect bite (nonvenomous) of right upper arm, initial encounter: Secondary | ICD-10-CM

## 2018-02-06 DIAGNOSIS — W57XXXA Bitten or stung by nonvenomous insect and other nonvenomous arthropods, initial encounter: Secondary | ICD-10-CM | POA: Diagnosis not present

## 2018-02-06 MED ORDER — TRIAMCINOLONE ACETONIDE 0.1 % EX CREA: 1.0000 "application " | TOPICAL_CREAM | Freq: Two times a day (BID) | CUTANEOUS | 0 refills | Status: DC

## 2018-02-06 NOTE — Patient Instructions (Signed)
Keep cool (cool helps itch) - ice pack is fine  Get zyrtec over the counter and take 10 mg daily until itching subsides   Use the triamcinolone cream twice daily   Keep area clean with soap and water   If worse - bigger/redder/ fever or rash or other symptoms please alert [place]

## 2018-02-06 NOTE — Assessment & Plan Note (Signed)
No tick or stinger seen  tx with triamcinolone cream bid Soap and water cleanse Cool compress  Zyrtec 10 mg daily for itch  Update if not starting to improve in a week or if worsening  - esp if inc in size of bite or if fever or other symptoms

## 2018-02-06 NOTE — Progress Notes (Signed)
Subjective:    Patient ID: [name], female    DOB: [birth year], 67 y.o.   MRN: [id number]  HPI  Here for insect bite of R arm  ? If poss spider bite  Was pulling out evergreens from garden  Saw a little tiny line on her skin - like dried blood/ does not think it was a stinger  Never saw a tick   Felt a pinch  Above her glove  Using betadyne  Also bacitracin  She took some benadryl (it makes her wired instead of sleepy)  No fever  Fleeting chilled feeling  Temp: 98.5 F (36.9 C)    No tick bites this year   Patient Active Problem List   Diagnosis Date Noted  . Insect bite of right arm 02/06/2018  . Thoracic back pain 09/27/2017  . Skin lesion 08/11/2017  . Breast cancer screening 05/31/2017  . Hyperglycemia 06/02/2016  . Stress reaction 05/10/2016  . Estrogen deficiency 01/29/2015  . Menopausal disorder 05/03/2014  . Routine general medical examination at a health care facility 02/20/2012  . DEPRESSION 06/20/2008  . GERD 06/20/2008   Past Medical History:  Diagnosis Date  . Depression   . Fatty liver   . Fibrocystic disease of both breasts   . GERD (gastroesophageal reflux disease)   . Hyperglycemia   . 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 Sphincter of Oddi dysfunction    Past Surgical History:  Procedure Laterality Date  . ABDOMINAL HYSTERECTOMY    . CARPAL TUNNEL RELEASE  08/2008   left  . ERCP    . LEFT HEART CATHETERIZATION WITH CORONARY ANGIOGRAM N/A 06/08/2013   Procedure: LEFT HEART CATHETERIZATION WITH CORONARY ANGIOGRAM;  Surgeon: Ramond Dial, MD;  Location: [facility] CATH LAB;  Service: Cardiovascular;  Laterality: N/A;  . MASTECTOMY     bilateral for severe macrocystic breast   Social History   Tobacco Use  . Smoking status: Never Smoker  . Smokeless tobacco: Never Used  Substance Use Topics  . Alcohol use: Yes    Alcohol/week: 0.0 oz    Comment: occ  . Drug use: No    Family History  Problem Relation Age of Onset  . Hypertension Father   . Cancer Father        prostate and leukemia  . Cancer Maternal Aunt        Pancreatic  . Diabetes Maternal Uncle   . Heart disease Maternal Grandmother   . Brain cancer Mother        hospice  . Breast cancer Sister 15   Allergies  Allergen Reactions  . Atorvastatin     REACTION: rash  . Ezetimibe     REACTION: muscle aches  . Sulfamethoxazole-Trimethoprim Other (See Comments)    Swelling of nostrils   Current Outpatient Medications on File Prior to Visit  Medication Sig Dispense Refill  . acetaminophen (TYLENOL) 500 MG tablet Take 500 mg by mouth at bedtime.     . ALPRAZolam (XANAX) 0.5 MG tablet TAKE ONE TABLET BY MOUTH AT BEDTIME AS NEEDED FOR ANXIETY OR SLEEP 30 tablet 3  . Calcium-Magnesium-Vitamin D (CALCIUM 1200+D3 PO) Take 1 tablet by mouth at bedtime.    . conjugated estrogens (PREMARIN) vaginal cream Quarter applicator qhs 2x/week 51.7 g 12  . cyclobenzaprine (FLEXERIL) 10 MG tablet     . diclofenac sodium (VOLTAREN) 1 % GEL APPLY 4 (FOUR) GRAMS TOPICALLY 4 (FOUR) TIMES  DAILY 500 g 3  . doxylamine, Sleep, (UNISOM) 25 MG tablet Take 25 mg by mouth at bedtime as needed.    . gabapentin (NEURONTIN) 300 MG capsule Take 1 capsule (300 mg total) by mouth 3 (three) times daily as needed. 90 capsule 2  . mometasone (ELOCON) 0.1 % ointment     . nitroGLYCERIN (NITROSTAT) 0.4 MG SL tablet Place 1 tablet (0.4 mg total) under the tongue every 5 (five) minutes as needed for chest pain. 100 tablet 3  . pantoprazole (PROTONIX) 40 MG tablet Take 1 tablet (40 mg total) by mouth 2 (two) times daily. 180 tablet 3   No current facility-administered medications on file prior to visit.      Review of Systems     Objective:   Physical Exam  Constitutional: She is oriented to person, place, and time. She appears well-developed and well-nourished. No distress.  Well appearing   HENT:  Head: Normocephalic and  atraumatic.  No swelling of lips/mouth  Eyes: Pupils are equal, round, and reactive to light. Conjunctivae and EOM are normal. Right eye exhibits no discharge. Left eye exhibits no discharge.  Neck: Normal range of motion. Neck supple.  Pulmonary/Chest: Effort normal and breath sounds normal. No respiratory distress. She has no wheezes.  Lymphadenopathy:    She has no cervical adenopathy.  Neurological: She is alert and oriented to person, place, and time. No cranial nerve deficit.  Skin: Skin is warm and dry. No rash noted. There is erythema.  3 by 3 round area of induration and erythema on R anterior forearm  Small scab in middle No insect parts visible No drainage nontender  No rash     Psychiatric: She has a normal mood and affect.  Pleasant           Assessment & Plan:   Problem List Items Addressed This Visit      Musculoskeletal and Integument   Insect bite of right arm - Primary    No tick or stinger seen  tx with triamcinolone cream bid Soap and water cleanse Cool compress  Zyrtec 10 mg daily for itch  Update if not starting to improve in a week or if worsening  - esp if inc in size of bite or if fever or other symptoms

## 2018-03-06 ENCOUNTER — Other Ambulatory Visit: Payer: Self-pay | Admitting: *Deleted

## 2018-03-06 MED ORDER — ALPRAZOLAM 0.5 MG PO TABS: ORAL_TABLET | ORAL | 3 refills | Status: DC

## 2018-03-06 NOTE — Telephone Encounter (Signed)
Name of Medication: xanax Name of Pharmacy: [facility] or Written Date and Quantity: 10/28/17 #30 with 3 refills Last Office Visit and Type:02/06/18 (Bug bite) Next Office Visit and Type: 06/09/18 CPE (AWV scheduled too) Last Controlled Substance Agreement Date: none done Last UDS: none done

## 2018-04-30 ENCOUNTER — Other Ambulatory Visit: Payer: Self-pay | Admitting: Family Medicine

## 2018-04-30 DIAGNOSIS — Z01419 Encounter for gynecological examination (general) (routine) without abnormal findings: Secondary | ICD-10-CM

## 2018-04-30 DIAGNOSIS — N959 Unspecified menopausal and perimenopausal disorder: Secondary | ICD-10-CM

## 2018-06-01 ENCOUNTER — Telehealth: Payer: Self-pay | Admitting: Family Medicine

## 2018-06-01 DIAGNOSIS — R739 Hyperglycemia, unspecified: Secondary | ICD-10-CM

## 2018-06-01 DIAGNOSIS — Z0000 Encounter for general adult medical examination without abnormal findings: Secondary | ICD-10-CM

## 2018-06-01 NOTE — Telephone Encounter (Signed)
-----   Message from Eustace Pen, LPN sent at 18/03/6772  7:53 AM EDT ----- Regarding: Labs 10/8 Lab orders needed. Thank you.  Insurance:  [facility]

## 2018-06-06 ENCOUNTER — Ambulatory Visit (INDEPENDENT_AMBULATORY_CARE_PROVIDER_SITE_OTHER): Payer: Medicare Other

## 2018-06-06 VITALS — BP 126/88 | HR 73 | Temp 98.0°F | Ht 63.5 in | Wt 161.0 lb

## 2018-06-06 DIAGNOSIS — Z0000 Encounter for general adult medical examination without abnormal findings: Secondary | ICD-10-CM

## 2018-06-06 DIAGNOSIS — R739 Hyperglycemia, unspecified: Secondary | ICD-10-CM

## 2018-06-06 LAB — COMPREHENSIVE METABOLIC PANEL
ALT: 21 U/L (ref 0–35)
AST: 18 U/L (ref 0–37)
Albumin: 4.4 g/dL (ref 3.5–5.2)
Alkaline Phosphatase: 45 U/L (ref 39–117)
BUN: 15 mg/dL (ref 6–23)
CO2: 27 mEq/L (ref 19–32)
Calcium: 9.9 mg/dL (ref 8.4–10.5)
Chloride: 103 mEq/L (ref 96–112)
Creatinine, Ser: 0.63 mg/dL (ref 0.40–1.20)
GFR: 99.66 mL/min (ref >60.00)
Glucose, Bld: 109 mg/dL — ABNORMAL HIGH (ref 70–99)
Potassium: 4.3 mEq/L (ref 3.5–5.1)
Sodium: 139 mEq/L (ref 135–145)
Total Bilirubin: 0.9 mg/dL (ref 0.2–1.2)
Total Protein: 7.4 g/dL (ref 6.0–8.3)

## 2018-06-06 LAB — CBC WITH DIFFERENTIAL/PLATELET
Basophils Absolute: 0 10*3/uL (ref 0.0–0.1)
Basophils Relative: 0.6 % (ref 0.0–3.0)
Eosinophils Absolute: 0.2 10*3/uL (ref 0.0–0.7)
Eosinophils Relative: 3.3 % (ref 0.0–5.0)
HCT: 40 % (ref 36.0–46.0)
Hemoglobin: 13.3 g/dL (ref 12.0–15.0)
Lymphocytes Relative: 40.9 % (ref 12.0–46.0)
Lymphs Abs: 2.5 10*3/uL (ref 0.7–4.0)
MCHC: 33.2 g/dL (ref 30.0–36.0)
MCV: 86.2 fl (ref 78.0–100.0)
Monocytes Absolute: 0.5 10*3/uL (ref 0.1–1.0)
Monocytes Relative: 8.6 % (ref 3.0–12.0)
Neutro Abs: 2.8 10*3/uL (ref 1.4–7.7)
Neutrophils Relative %: 46.6 % (ref 43.0–77.0)
Platelets: 285 10*3/uL (ref 150.0–400.0)
RBC: 4.65 Mil/uL (ref 3.87–5.11)
RDW: 14.5 % (ref 11.5–15.5)
WBC: 6.1 10*3/uL (ref 4.0–10.5)

## 2018-06-06 LAB — LDL CHOLESTEROL, DIRECT: Direct LDL: 97 mg/dL

## 2018-06-06 LAB — LIPID PANEL
Cholesterol: 188 mg/dL (ref 0–200)
HDL: 65.1 mg/dL (ref >39.00)
NonHDL: 122.75
Total CHOL/HDL Ratio: 3
Triglycerides: 205 mg/dL — ABNORMAL HIGH (ref 0.0–149.0)
VLDL: 41 mg/dL — ABNORMAL HIGH (ref 0.0–40.0)

## 2018-06-06 LAB — HEMOGLOBIN A1C: Hgb A1c MFr Bld: 6.4 % (ref 4.6–6.5)

## 2018-06-06 LAB — TSH: TSH: 3 u[IU]/mL (ref 0.35–4.50)

## 2018-06-06 NOTE — Patient Instructions (Signed)
Ms. [name] , Thank you for taking time to come for your Medicare Wellness Visit. I appreciate your ongoing commitment to your health goals. Please review the following plan we discussed and let me know if I can assist you in the future.   These are the goals we discussed: Goals    . Weight (lb) < 141 lb (64 kg)     Target weight is 140 lbs. Starting 06/06/2018, I will attempt to monitor intake of white bread, white potatoes, and other simple carbs.        This is a list of the screening recommended for you and due dates:  Health Maintenance  Topic Date Due  . Flu Shot  11/29/2018*  . Mammogram  03/11/2019*  . Colon Cancer Screening  08/11/2023  . Tetanus Vaccine  05/03/2024  . DEXA scan (bone density measurement)  Completed  .  Hepatitis C: One time screening is recommended by Center for Disease Control  (CDC) for  adults born from [birth year] through 1966.   Completed  . Pneumonia vaccines  Completed  *Topic was postponed. The date shown is not the original due date.   Preventive Care for Adults  A healthy lifestyle and preventive care can promote health and wellness. Preventive health guidelines for adults include the following key practices.  . A routine yearly physical is a good way to check with your health care provider about your health and preventive screening. It is a chance to share any concerns and updates on your health and to receive a thorough exam.  . Visit your dentist for a routine exam and preventive care every 6 months. Brush your teeth twice a day and floss once a day. Good oral hygiene prevents tooth decay and gum disease.  . The frequency of eye exams is based on your age, health, family medical history, use  of contact lenses, and other factors. Follow your health care provider's recommendations for frequency of eye exams.  . Eat a healthy diet. Foods like vegetables, fruits, whole grains, low-fat dairy products, and lean protein foods contain the nutrients you need  without too many calories. Decrease your intake of foods high in solid fats, added sugars, and salt. Eat the right amount of calories for you. Get information about a proper diet from your health care provider, if necessary.  . Regular physical exercise is one of the most important things you can do for your health. Most adults should get at least 150 minutes of moderate-intensity exercise (any activity that increases your heart rate and causes you to sweat) each week. In addition, most adults need muscle-strengthening exercises on 2 or more days a week.  Silver Sneakers may be a benefit available to you. To determine eligibility, you may visit the website: www.silversneakers.com or contact program at [phone] Mon-Fri between 8AM-8PM.   . Maintain a healthy weight. The body mass index (BMI) is a screening tool to identify possible weight problems. It provides an estimate of body fat based on height and weight. Your health care provider can find your BMI and can help you achieve or maintain a healthy weight.   For adults 20 years and older: ? A BMI below 18.5 is considered underweight. ? A BMI of 18.5 to 24.9 is normal. ? A BMI of 25 to 29.9 is considered overweight. ? A BMI of 30 and above is considered obese.   . Maintain normal blood lipids and cholesterol levels by exercising and minimizing your intake of saturated fat. Eat a  balanced diet with plenty of fruit and vegetables. Blood tests for lipids and cholesterol should begin at age 41 and be repeated every 5 years. If your lipid or cholesterol levels are high, you are over 50, or you are at high risk for heart disease, you may need your cholesterol levels checked more frequently. Ongoing high lipid and cholesterol levels should be treated with medicines if diet and exercise are not working.  . If you smoke, find out from your health care provider how to quit. If you do not use tobacco, please do not start.  . If you choose to drink  alcohol, please do not consume more than 2 drinks per day. One drink is considered to be 12 ounces (355 mL) of beer, 5 ounces (148 mL) of wine, or 1.5 ounces (44 mL) of liquor.  . If you are 74-95 years old, ask your health care provider if you should take aspirin to prevent strokes.  . Use sunscreen. Apply sunscreen liberally and repeatedly throughout the day. You should seek shade when your shadow is shorter than you. Protect yourself by wearing long sleeves, pants, a wide-brimmed hat, and sunglasses year round, whenever you are outdoors.  . Once a month, do a whole body skin exam, using a mirror to look at the skin on your back. Tell your health care provider of new moles, moles that have irregular borders, moles that are larger than a pencil eraser, or moles that have changed in shape or color.

## 2018-06-06 NOTE — Progress Notes (Signed)
Subjective:   [name] is a 67 y.o. female who presents for Medicare Annual (Subsequent) preventive examination.  Review of Systems:  N/A Cardiac Risk Factors include: advanced age (>40men, >56 women)     Objective:     Vitals: BP 126/88 (BP Location: Right Arm, Patient Position: Sitting, Cuff Size: Normal)   Pulse 73   Temp 98 F (36.7 C) (Oral)   Ht 5' 3.5" (1.613 m) Comment: no shoes  Wt 161 lb (73 kg)   SpO2 97%   BMI 28.07 kg/m   Body mass index is 28.07 kg/m.  Advanced Directives 06/06/2018 05/26/2017 05/10/2016 06/08/2013  Does Patient Have a Medical Advance Directive? Yes Yes Yes Patient has advance directive, copy not in chart  Type of Advance Directive [place];Living will [place];Living will Living will Living will  Does patient want to make changes to medical advance directive? - - No - Patient declined -  Copy of [place] in Chart? No - copy requested No - copy requested No - copy requested -  Pre-existing out of facility DNR order (yellow form or pink MOST form) - - - No    Tobacco Social History   Tobacco Use  Smoking Status Never Smoker  Smokeless Tobacco Never Used     Counseling given: No   Clinical Intake:  Pre-visit preparation completed: Yes  Pain : No/denies pain Pain Score: 0-No pain     Nutritional Status: BMI 25 -29 Overweight Nutritional Risks: None Diabetes: No CBG done?: No Did pt. 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 Depression   . Fatty liver   . Fibrocystic disease of both breasts   . GERD  (gastroesophageal reflux disease)   . Hyperglycemia   . 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 Sphincter of Oddi dysfunction    Past Surgical History:  Procedure Laterality Date  . ABDOMINAL HYSTERECTOMY    . CARPAL TUNNEL RELEASE  08/2008   left  . ERCP    . LEFT HEART CATHETERIZATION WITH CORONARY ANGIOGRAM N/A 06/08/2013   Procedure: LEFT HEART CATHETERIZATION WITH CORONARY ANGIOGRAM;  Surgeon: Ramond Dial, MD;  Location: [facility] CATH LAB;  Service: Cardiovascular;  Laterality: N/A;  . MASTECTOMY     bilateral for severe macrocystic breast   Family History  Problem Relation Age of Onset  . Hypertension Father   . Cancer Father        prostate and leukemia  . Cancer Maternal Aunt        Pancreatic  . Diabetes Maternal Uncle   . Heart disease Maternal Grandmother   . Brain cancer Mother        hospice  . Breast cancer Sister 45   Social History   Socioeconomic History  . Marital status: Married    Spouse name: Not on file  . Number of children: Not on file  . Years of education: Not on file  . Highest education level: Not on file  Occupational History  . Not on file  Social  Needs  . Financial resource strain: Not on file  . Food insecurity:    Worry: Not on file    Inability: Not on file  . Transportation needs:    Medical: Not on file    Non-medical: Not on file  Tobacco Use  . Smoking status: Never Smoker  . Smokeless tobacco: Never Used  Substance and Sexual Activity  . Alcohol use: Yes    Alcohol/week: 0.0 standard drinks    Comment: occ  . Drug use: No  . Sexual activity: Yes    Birth control/protection: Surgical  Lifestyle  . Physical activity:    Days per week: Not on file    Minutes per session: Not on file  . Stress: Not on file  Relationships  . Social connections:    Talks on phone: Not on file    Gets together: Not on file    Attends religious service: Not on file    Active member  of club or organization: Not on file    Attends meetings of clubs or organizations: Not on file    Relationship status: Not on file  Other Topics Concern  . Not on file  Social History Narrative  . Not on file    Outpatient Encounter Medications as of 06/06/2018  Medication Sig  . acetaminophen (TYLENOL) 500 MG tablet Take 500 mg by mouth at bedtime.   . ALPRAZolam (XANAX) 0.5 MG tablet TAKE ONE TABLET BY MOUTH AT BEDTIME AS NEEDED FOR ANXIETY OR SLEEP  . ASPIRIN PO Take 81 mg by mouth daily.  . Calcium-Magnesium-Vitamin D (CALCIUM 1200+D3 PO) Take 1 tablet by mouth at bedtime.  . conjugated estrogens (PREMARIN) vaginal cream Quarter applicator qhs 2x/week  . cyclobenzaprine (FLEXERIL) 10 MG tablet Take 10 mg by mouth as needed.   . diclofenac sodium (VOLTAREN) 1 % GEL APPLY 4 (FOUR) GRAMS TOPICALLY 4 (FOUR) TIMES DAILY (Patient taking differently: as needed. )  . diphenhydrAMINE HCl (BENADRYL PO) Take 25 mg by mouth at bedtime.  . gabapentin (NEURONTIN) 300 MG capsule Take 1 capsule (300 mg total) by mouth 3 (three) times daily as needed.  Marland Kitchen MELATONIN PO Take 10 mg by mouth daily.  . Multiple Vitamins-Minerals (EYE VITAMINS PO) Take 1 capsule by mouth daily.  . Multiple Vitamins-Minerals (MULTIVITAMIN GUMMIES ADULT PO) Take by mouth daily.  . nitroGLYCERIN (NITROSTAT) 0.4 MG SL tablet Place 1 tablet (0.4 mg total) under the tongue every 5 (five) minutes as needed for chest pain.  . Omega-3 Fatty Acids (OMEGA-3 FISH OIL PO) Take 1,000 mg by mouth daily.  . pantoprazole (PROTONIX) 40 MG tablet Take 1 tablet (40 mg total) by mouth 2 (two) times daily.  . Probiotic Product (PROBIOTIC PO) Take 1 capsule by mouth daily.  . Pyridoxine HCl (VITAMIN B-6 PO) Take 100 mg by mouth daily.  . [DISCONTINUED] doxylamine, Sleep, (UNISOM) 25 MG tablet Take 25 mg by mouth at bedtime as needed.  . [DISCONTINUED] mometasone (ELOCON) 0.1 % ointment   . [DISCONTINUED] triamcinolone cream (KENALOG) 0.1 % Apply  1 application topically 2 (two) times daily. To affected area   No facility-administered encounter medications on file as of 06/06/2018.     Activities of Daily Living In your present state of health, do you have any difficulty performing the following activities: 06/06/2018  Hearing? N  Vision? N  Difficulty concentrating or making decisions? N  Walking or climbing stairs? N  Dressing or bathing? N  Doing errands, shopping? N  Conservation officer, nature and  eating ? N  Using the Toilet? N  In the past six months, have you accidently leaked urine? N  Do you have problems with loss of bowel control? N  Managing your Medications? N  Managing your Finances? N  Housekeeping or managing your Housekeeping? N  Some recent data might be hidden    Patient Care Team: Tower, Wynelle Fanny, MD as PCP - General Ernestina Patches Juanita Craver, MD as Consulting Physician (Obstetrics and Gynecology) Sharyne Peach, MD as Consulting Physician (Ophthalmology)    Assessment:   This is a routine wellness examination for [place].   Hearing Screening   125Hz  250Hz  500Hz  1000Hz  2000Hz  3000Hz  4000Hz  6000Hz  8000Hz   Right ear:   40 40 40  40    Left ear:   40 40 40  40      Visual Acuity Screening   Right eye Left eye Both eyes  Without correction:     With correction: 20/13-1 20/15-1 20/13     Exercise Activities and Dietary recommendations Current Exercise Habits: The patient does not participate in regular exercise at present, Exercise limited by: None identified  Goals    . Weight (lb) < 141 lb (64 kg)     Target weight is 140 lbs. Starting 06/06/2018, I will attempt to monitor intake of white bread, white potatoes, and other simple carbs.        Fall Risk Fall Risk  06/06/2018 05/26/2017 05/10/2016 01/29/2015  Falls in the past year? No Yes No No  Comment - pt tripped over box - -  Number falls in past yr: - 1 - -  Injury with Fall? - No - -   Depression Screen PHQ 2/9 Scores 06/06/2018 05/26/2017 05/10/2016  01/29/2015  PHQ - 2 Score 0 1 0 0  PHQ- 9 Score 5 2 - -     Cognitive Function MMSE - Mini Mental State Exam 06/06/2018 05/10/2016  Orientation to time 5 5  Orientation to Place 5 5  Registration 3 3  Attention/ Calculation 0 0  Recall 3 3  Language- name 2 objects 0 0  Language- repeat 1 1  Language- follow 3 step command 3 3  Language- read & follow direction 0 0  Write a sentence 0 0  Copy design 0 0  Total score 20 20     PLEASE NOTE: A Mini-Cog screen was completed. Maximum score is 20. A value of 0 denotes this part of [place] MMSE was not completed or the patient failed this part of the Mini-Cog screening.   Mini-Cog Screening Orientation to Time - Max 5 pts Orientation to Place - Max 5 pts Registration - Max 3 pts Recall - Max 3 pts Language Repeat - Max 1 pts Language Follow 3 Step Command - Max 3 pts     Immunization History  Administered Date(s) Administered  . Influenza Whole 05/30/2008  . Influenza,inj,Quad PF,6+ Mos 05/03/2014, 06/06/2015, 05/10/2016, 05/26/2017  . Pneumococcal Conjugate-13 01/29/2015  . Pneumococcal Polysaccharide-23 05/10/2016  . Td 08/30/2002  . Tdap 05/03/2014  . Zoster 09/07/2012    Screening Tests Health Maintenance  Topic Date Due  . INFLUENZA VACCINE  11/29/2018 (Originally 03/30/2018)  . MAMMOGRAM  03/11/2019 (Originally 03/11/2017)  . COLONOSCOPY  08/11/2023  . TETANUS/TDAP  05/03/2024  . DEXA SCAN  Completed  . Hepatitis C Screening  Completed  . PNA vac Low Risk Adult  Completed       Plan:   I have personally reviewed, addressed, and noted the following in  the patient's chart:  A. Medical and social history B. Use of alcohol, tobacco or illicit drugs  C. Current medications and supplements D. Functional ability and status E.  Nutritional status F.  Physical activity G. Advance directives H. List of other physicians I.  Hospitalizations, surgeries, and ER visits in previous 12 months J.  [place] to  include hearing, vision, cognitive, depression L. Referrals and appointments - none  In addition, I have reviewed and discussed with patient certain preventive protocols, quality metrics, and best practice recommendations. A written personalized care plan for preventive services as well as general preventive health recommendations were provided to patient.  See attached scanned questionnaire for additional information.   Signed,   Lindell Noe, MHA, BS, LPN Health Coach

## 2018-06-06 NOTE — Progress Notes (Signed)
PCP notes:   Health maintenance:  Flu vaccine - postponed/pt has sore throat  Abnormal screenings:   Depression score: 5 Depression screen [facility] 2/9 06/06/2018 05/26/2017 05/10/2016 01/29/2015  Decreased Interest 0 0 0 0  Down, Depressed, Hopeless 0 1 0 0  PHQ - 2 Score 0 1 0 0  Altered sleeping 3 1 - -  Tired, decreased energy 1 0 - -  Change in appetite 0 0 - -  Feeling bad or failure about yourself  1 0 - -  Trouble concentrating 0 0 - -  Moving slowly or fidgety/restless 0 0 - -  Suicidal thoughts 0 0 - -  PHQ-9 Score 5 2 - -  Difficult doing work/chores Not difficult at all Not difficult at all - -   Patient concerns:   Patient reports concerns with intermittent pain and itching on right side of abdomen.   Nurse concerns:  None  Next PCP appt:   06/09/18 @ 1130  I reviewed health advisor's note, was available for consultation, and agree with documentation and plan. Loura Pardon MD

## 2018-06-09 ENCOUNTER — Ambulatory Visit (INDEPENDENT_AMBULATORY_CARE_PROVIDER_SITE_OTHER): Payer: Medicare Other | Admitting: Family Medicine

## 2018-06-09 ENCOUNTER — Encounter: Payer: Self-pay | Admitting: Family Medicine

## 2018-06-09 VITALS — BP 130/76 | HR 80 | Temp 98.6°F | Ht 63.5 in | Wt 160.2 lb

## 2018-06-09 DIAGNOSIS — E2839 Other primary ovarian failure: Secondary | ICD-10-CM | POA: Diagnosis not present

## 2018-06-09 DIAGNOSIS — Z0000 Encounter for general adult medical examination without abnormal findings: Secondary | ICD-10-CM | POA: Diagnosis not present

## 2018-06-09 DIAGNOSIS — N952 Postmenopausal atrophic vaginitis: Secondary | ICD-10-CM

## 2018-06-09 DIAGNOSIS — Z01419 Encounter for gynecological examination (general) (routine) without abnormal findings: Secondary | ICD-10-CM

## 2018-06-09 DIAGNOSIS — R7303 Prediabetes: Secondary | ICD-10-CM | POA: Diagnosis not present

## 2018-06-09 DIAGNOSIS — N959 Unspecified menopausal and perimenopausal disorder: Secondary | ICD-10-CM

## 2018-06-09 DIAGNOSIS — Z23 Encounter for immunization: Secondary | ICD-10-CM

## 2018-06-09 DIAGNOSIS — Z1239 Encounter for other screening for malignant neoplasm of breast: Secondary | ICD-10-CM | POA: Diagnosis not present

## 2018-06-09 MED ORDER — ESTROGENS, CONJUGATED 0.625 MG/GM VA CREA: TOPICAL_CREAM | VAGINAL | 3 refills | Status: DC

## 2018-06-09 MED ORDER — DICLOFENAC SODIUM 1 % TD GEL: 4.0000 g | Freq: Four times a day (QID) | TRANSDERMAL | 3 refills | Status: DC | PRN

## 2018-06-09 MED ORDER — PANTOPRAZOLE SODIUM 40 MG PO TBEC: 40.0000 mg | DELAYED_RELEASE_TABLET | Freq: Two times a day (BID) | ORAL | 3 refills | Status: DC

## 2018-06-09 NOTE — Progress Notes (Signed)
Subjective:    Patient ID: [name], female    DOB: 11-Jun-1951, 67 y.o.   MRN: [id number]  HPI  Here for health maintenance exam and to review chronic medical problems    Wt Readings from Last 3 Encounters:  06/09/18 160 lb 4 oz (72.7 kg)  06/06/18 161 lb (73 kg)  02/06/18 160 lb 8 oz (72.8 kg)  working outdoors for exercise  At least 1 hour day  Tries to eat healthy -not perfect  27.94 kg/m   Had amw on 10/8 Flu vaccine postponed due to ST Depression score is a 5  She found out that gabapentin could could cause ST She stopped it  Now ST is gone  Took it for hot flashes   RUQ pain -for years /no change  Labs are good  Had ccy  Flu shot -will get today    Mammogram 7/16  (had bilateral mastectomy for severe mastocystic dz  in the past- has implants with 10% breast tissue left)  Sister had breast cancer  Self breast exam -no changes  Had a mammogram - 1/19 -no report/[facility]   Colonoscopy 12/14 with 10 y recall   Used to see Dr Ernestina Patches- gyn for her premarin cream  Would like to come here for that  Has one ovary  Premarin cream is helping her vaginal atrophy  She uses it twice weekly    dexa 6/16-mild osteopenia  Takes ca and D Is ready to set that up   zostavax 1/14  Blood pressure  BP Readings from Last 3 Encounters:  06/09/18 130/76  06/06/18 126/88  02/06/18 122/64   Pulse Readings from Last 3 Encounters:  06/09/18 80  06/06/18 73  02/06/18 83    Prediabetes Lab Results  Component Value Date   HGBA1C 6.4 06/06/2018  last time 6.3   Cholesterol Lab Results  Component Value Date   CHOL 188 06/06/2018   CHOL 198 05/26/2017   CHOL 195 05/05/2016   Lab Results  Component Value Date   HDL 65.10 06/06/2018   HDL 66.00 05/26/2017   HDL 62.40 05/05/2016   Lab Results  Component Value Date   LDLCALC 105 (H) 05/26/2017   LDLCALC 102 (H) 05/05/2016   LDLCALC 82 01/21/2015   Lab Results  Component Value Date   TRIG 205.0 (H)  06/06/2018   TRIG 137.0 05/26/2017   TRIG 149.0 05/05/2016   Lab Results  Component Value Date   CHOLHDL 3 06/06/2018   CHOLHDL 3 05/26/2017   CHOLHDL 3 05/05/2016   Lab Results  Component Value Date   LDLDIRECT 97.0 06/06/2018   LDLDIRECT 119.0 05/24/2013   LDLDIRECT 117.3 02/24/2012   Fairly stable Trig are up due to prediabetes likely  She does take fish oil and eats healthy   Lab Results  Component Value Date   WBC 6.1 06/06/2018   HGB 13.3 06/06/2018   HCT 40.0 06/06/2018   MCV 86.2 06/06/2018   PLT 285.0 06/06/2018   Lab Results  Component Value Date   CREATININE 0.63 06/06/2018   BUN 15 06/06/2018   NA 139 06/06/2018   K 4.3 06/06/2018   CL 103 06/06/2018   CO2 27 06/06/2018   Lab Results  Component Value Date   ALT 21 06/06/2018   AST 18 06/06/2018   ALKPHOS 45 06/06/2018   BILITOT 0.9 06/06/2018    Lab Results  Component Value Date   TSH 3.00 06/06/2018     Patient Active Problem List  Diagnosis Date Noted  . Thoracic back pain 09/27/2017  . Skin lesion 08/11/2017  . Breast cancer screening 05/31/2017  . Prediabetes 06/02/2016  . Stress reaction 05/10/2016  . Estrogen deficiency 01/29/2015  . Menopausal disorder 05/03/2014  . Routine general medical examination at a health care facility 02/20/2012  . DEPRESSION 06/20/2008  . GERD 06/20/2008   Past Medical History:  Diagnosis Date  . Depression   . Fatty liver   . Fibrocystic disease of both breasts   . GERD (gastroesophageal reflux disease)   . Hyperglycemia   . 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 Sphincter of Oddi dysfunction    Past Surgical History:  Procedure Laterality Date  . ABDOMINAL HYSTERECTOMY    . CARPAL TUNNEL RELEASE  08/2008   left  . ERCP    . LEFT HEART CATHETERIZATION WITH CORONARY ANGIOGRAM N/A 06/08/2013   Procedure: LEFT HEART CATHETERIZATION WITH CORONARY ANGIOGRAM;  Surgeon: Ramond Dial, MD;   [facility];  Service: Cardiovascular;  Laterality: N/A;  . MASTECTOMY     bilateral for severe macrocystic breast   Social History   Tobacco Use  . Smoking status: Never Smoker  . Smokeless tobacco: Never Used  Substance Use Topics  . Alcohol use: Yes    Alcohol/week: 0.0 standard drinks    Comment: occ  . Drug use: No   Family History  Problem Relation Age of Onset  . Hypertension Father   . Cancer Father        prostate and leukemia  . Cancer Maternal Aunt        Pancreatic  . Diabetes Maternal Uncle   . Heart disease Maternal Grandmother   . Brain cancer Mother        hospice  . Breast cancer Sister 43   Allergies  Allergen Reactions  . Atorvastatin     REACTION: rash  . Ezetimibe     REACTION: muscle aches  . Sulfamethoxazole-Trimethoprim Other (See Comments)    Swelling of nostrils   Current Outpatient Medications on File Prior to Visit  Medication Sig Dispense Refill  . acetaminophen (TYLENOL) 500 MG tablet Take 500 mg by mouth at bedtime.     . ALPRAZolam (XANAX) 0.5 MG tablet TAKE ONE TABLET BY MOUTH AT BEDTIME AS NEEDED FOR ANXIETY OR SLEEP 30 tablet 3  . ASPIRIN PO Take 81 mg by mouth daily.    . Calcium-Magnesium-Vitamin D (CALCIUM 1200+D3 PO) Take 1 tablet by mouth at bedtime.    . conjugated estrogens (PREMARIN) vaginal cream Quarter applicator qhs 2x/week 79.8 g 12  . cyclobenzaprine (FLEXERIL) 10 MG tablet Take 10 mg by mouth as needed.     . diclofenac sodium (VOLTAREN) 1 % GEL APPLY 4 (FOUR) GRAMS TOPICALLY 4 (FOUR) TIMES DAILY (Patient taking differently: as needed. ) 500 g 3  . diphenhydrAMINE HCl (BENADRYL PO) Take 25 mg by mouth at bedtime.    Marland Kitchen MELATONIN PO Take 10 mg by mouth daily.    . Multiple Vitamins-Minerals (EYE VITAMINS PO) Take 1 capsule by mouth daily.    . Multiple Vitamins-Minerals (MULTIVITAMIN GUMMIES ADULT PO) Take by mouth daily.    . nitroGLYCERIN (NITROSTAT) 0.4 MG SL tablet Place 1 tablet (0.4 mg total) under the  tongue every 5 (five) minutes as needed for chest pain. 100 tablet 3  . Omega-3 Fatty Acids (OMEGA-3 FISH OIL PO) Take 1,000 mg by mouth daily.    Marland Kitchen  pantoprazole (PROTONIX) 40 MG tablet Take 1 tablet (40 mg total) by mouth 2 (two) times daily. 180 tablet 3  . Probiotic Product (PROBIOTIC PO) Take 1 capsule by mouth daily.    . Pyridoxine HCl (VITAMIN B-6 PO) Take 100 mg by mouth daily.     No current facility-administered medications on file prior to visit.     Review of Systems  Constitutional: Negative for activity change, appetite change, fatigue, fever and unexpected weight change.  HENT: Negative for congestion, ear pain, rhinorrhea, sinus pressure and sore throat.   Eyes: Negative for pain, redness and visual disturbance.  Respiratory: Negative for cough, shortness of breath and wheezing.   Cardiovascular: Negative for chest pain and palpitations.  Gastrointestinal: Negative for abdominal pain, blood in stool, constipation and diarrhea.  Endocrine: Negative for polydipsia and polyuria.  Genitourinary: Negative for dysuria, frequency and urgency.       Vaginal dryness if she does not use estrogen cream   Musculoskeletal: Negative for arthralgias, back pain and myalgias.  Skin: Negative for pallor and rash.  Allergic/Immunologic: Negative for environmental allergies.  Neurological: Negative for dizziness, syncope and headaches.  Hematological: Negative for adenopathy. Does not bruise/bleed easily.  Psychiatric/Behavioral: Negative for decreased concentration and dysphoric mood. The patient is not nervous/anxious.        Objective:   Physical Exam  Constitutional: She appears well-developed and well-nourished. No distress.  overwt and well appearing  HENT:  Head: Normocephalic and atraumatic.  Right Ear: External ear normal.  Left Ear: External ear normal.  Mouth/Throat: Oropharynx is clear and moist.  Eyes: Pupils are equal, round, and reactive to light. Conjunctivae and EOM  are normal. No scleral icterus.  Neck: Normal range of motion. Neck supple. No JVD present. Carotid bruit is not present. No thyromegaly present.  Cardiovascular: Normal rate, regular rhythm, normal heart sounds and intact distal pulses. Exam reveals no gallop.  Pulmonary/Chest: Effort normal and breath sounds normal. No respiratory distress. She has no wheezes. She exhibits no tenderness. No breast tenderness, discharge or bleeding.  Abdominal: Soft. Bowel sounds are normal. She exhibits no distension, no abdominal bruit and no mass. There is no tenderness.  Genitourinary: No breast tenderness, discharge or bleeding.  Genitourinary Comments: Breast exam: (bilateral breast implants) No mass, nodules, thickening, tenderness, bulging, retraction, inflamation, nipple discharge or skin changes noted.  No axillary or clavicular LA.              Anus appears normal w/o hemorrhoids or masses     External genitalia : nl appearance and hair distribution/no lesions     Urethral meatus : nl size, no lesions or prolapse     Urethra: no masses, tenderness or scarring    Bladder : no masses or tenderness     Vagina: nl general appearance, no discharge or  Lesions, no significant cystocele  or rectocele     Cervix: sx absent     Uterus: sx absent     Adnexa : no masses, tenderness, enlargement or nodularity (ovaries not palpable)       Musculoskeletal: Normal range of motion. She exhibits no edema or tenderness.  Lymphadenopathy:    She has no cervical adenopathy.  Neurological: She is alert. She has normal reflexes. She displays normal reflexes. No cranial nerve deficit. She exhibits normal muscle tone. Coordination normal.  Skin: Skin is warm and dry. No rash noted. No erythema. No pallor.  Solar lentigines diffusely Some sks   Psychiatric: She has a normal mood  and affect.          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  Pt reports she had mammo at [facility] 1/19 -we do not have report and sent for it        Estrogen deficiency   Relevant Orders   DG Bone Density   RESOLVED: Menopausal disorder   Relevant Medications   conjugated estrogens (PREMARIN) vaginal cream   Prediabetes    Lab Results  Component Value Date   HGBA1C 6.4 06/06/2018   disc imp of low glycemic diet and wt loss to prevent DM2       Routine general medical examination at a health care facility - Primary    Reviewed health habits including diet and exercise and skin cancer prevention Reviewed appropriate screening tests for age  Also reviewed health mt list, fam hx and immunization status , as well as social and family history   See HPI Labs reviewed amw reviewed  Flu vaccine today  Sent for mammogram report        Other Visit Diagnoses    Well woman exam with routine gynecological exam       Relevant Medications   conjugated estrogens (PREMARIN) vaginal cream   Need for influenza vaccination       Relevant Orders   Flu Vaccine QUAD 6+ mos PF IM (Fluarix Quad PF) (Completed)

## 2018-06-09 NOTE — Patient Instructions (Addendum)
You are prediabetic  Try to get most of your carbohydrates from produce (with the exception of white potatoes)  Eat less bread/pasta/rice/snack foods/cereals/sweets and other items from the middle of the grocery store (processed carbs)   Please sign a release at check out to get your mammogram report    Take care of yourself  Flu shot today

## 2018-06-11 NOTE — Assessment & Plan Note (Signed)
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

## 2018-06-11 NOTE — Assessment & Plan Note (Signed)
Nl /reassuring gyn exam today  Will take over estrogen cream from her gyn  No c/o  Uses small amt twice weekly  Disc risks of hormone products

## 2018-06-11 NOTE — Assessment & Plan Note (Signed)
Lab Results  Component Value Date   HGBA1C 6.4 06/06/2018   disc imp of low glycemic diet and wt loss to prevent DM2

## 2018-06-11 NOTE — Assessment & Plan Note (Signed)
Reviewed health habits including diet and exercise and skin cancer prevention Reviewed appropriate screening tests for age  Also reviewed health mt list, fam hx and immunization status , as well as social and family history   See HPI Labs reviewed amw reviewed  Flu vaccine today  Sent for mammogram report

## 2018-06-12 ENCOUNTER — Encounter: Payer: Self-pay | Admitting: Family Medicine

## 2018-06-19 ENCOUNTER — Ambulatory Visit (INDEPENDENT_AMBULATORY_CARE_PROVIDER_SITE_OTHER)
Admission: RE | Admit: 2018-06-19 | Discharge: 2018-06-19 | Disposition: A | Discharge status: Home / Self Care | Payer: Medicare Other | Source: Ambulatory Visit | Attending: Family Medicine | Admitting: Family Medicine

## 2018-06-19 DIAGNOSIS — E2839 Other primary ovarian failure: Secondary | ICD-10-CM | POA: Diagnosis not present

## 2018-06-20 ENCOUNTER — Encounter: Payer: Self-pay | Admitting: *Deleted

## 2018-07-05 ENCOUNTER — Other Ambulatory Visit: Payer: Self-pay | Admitting: *Deleted

## 2018-07-05 MED ORDER — ALPRAZOLAM 0.5 MG PO TABS: ORAL_TABLET | ORAL | 0 refills | Status: DC

## 2018-07-05 NOTE — Telephone Encounter (Signed)
Name of Medication: xanax Name of Pharmacy: [facility] or Written Date and Quantity: 03/06/18 #30 tabs with 3 additional refills Last Office Visit and Type: CPE on 06/09/18 Next Office Visit and Type: CPE on 06/14/19 (AWV scheduled too) Last Controlled Substance Agreement Date: none on file Last LKT:[id number] on file

## 2018-07-12 ENCOUNTER — Encounter: Payer: Self-pay | Admitting: Radiology

## 2018-08-02 ENCOUNTER — Other Ambulatory Visit: Payer: Self-pay | Admitting: *Deleted

## 2018-08-02 MED ORDER — ALPRAZOLAM 0.5 MG PO TABS: ORAL_TABLET | ORAL | 0 refills | Status: DC

## 2018-08-02 NOTE — Telephone Encounter (Signed)
Name of Medication: xanax Name of Pharmacy: [facility] or Written Date and Quantity: 07/05/18 #30 with 0 refills Last Office Visit and Type: CPE on 06/09/18 Next Office Visit and Type: CPE on 06/14/19 (AWV scheduled too) Last Controlled Substance Agreement Date: none on file Last GUR:KYHC on file

## 2018-08-07 ENCOUNTER — Telehealth: Payer: Self-pay | Admitting: *Deleted

## 2018-08-07 MED ORDER — ALPRAZOLAM 1 MG PO TABS: 0.5000 mg | ORAL_TABLET | Freq: Every evening | ORAL | 3 refills | Status: DC | PRN

## 2018-08-07 NOTE — Telephone Encounter (Signed)
Pharmacy called team health yesterday at 2:51pm asking for this medication to be refilled.  [facility] instructed pharmacy to call back during normal business hours to the office to process.  As you can see below, [facility] today. (08/07/18).

## 2018-08-07 NOTE — Telephone Encounter (Signed)
[facility] [place] [facility] who states Xanax 0.5 is on Psychologist, prison and probation services. She is requesting Rx for xanax 1mg  with new instruction. Last Rx 08/02/2018

## 2018-08-07 NOTE — Telephone Encounter (Signed)
I sent it electronically  

## 2018-09-11 ENCOUNTER — Encounter: Payer: Self-pay | Admitting: Family Medicine

## 2018-09-11 DIAGNOSIS — Z803 Family history of malignant neoplasm of breast: Secondary | ICD-10-CM | POA: Diagnosis not present

## 2018-09-11 DIAGNOSIS — Z1231 Encounter for screening mammogram for malignant neoplasm of breast: Secondary | ICD-10-CM | POA: Diagnosis not present

## 2018-10-11 DIAGNOSIS — H2513 Age-related nuclear cataract, bilateral: Secondary | ICD-10-CM | POA: Diagnosis not present

## 2018-10-11 DIAGNOSIS — E119 Type 2 diabetes mellitus without complications: Secondary | ICD-10-CM | POA: Diagnosis not present

## 2018-10-11 LAB — HM DIABETES EYE EXAM

## 2018-10-19 ENCOUNTER — Encounter: Payer: Self-pay | Admitting: Family Medicine

## 2018-12-03 ENCOUNTER — Other Ambulatory Visit: Payer: Self-pay | Admitting: Family Medicine

## 2018-12-05 NOTE — Telephone Encounter (Signed)
Name of Medication:xanax Name of Pharmacy:[facility] or Written Date and Quantity:08/07/18 #15 tabs with 3 refills Last Office Visit and Type:CPE on 06/09/18 Next Office Visit and Type:CPE on 06/14/19 (AWV scheduled too) Last Controlled Substance Agreement Date:none on file Last MZU:AUEB on file

## 2019-01-01 ENCOUNTER — Ambulatory Visit (INDEPENDENT_AMBULATORY_CARE_PROVIDER_SITE_OTHER): Payer: Medicare Other | Admitting: Primary Care

## 2019-01-01 ENCOUNTER — Other Ambulatory Visit: Payer: Self-pay

## 2019-01-01 DIAGNOSIS — B029 Zoster without complications: Secondary | ICD-10-CM | POA: Diagnosis not present

## 2019-01-01 MED ORDER — VALACYCLOVIR HCL 1 G PO TABS: 1000.0000 mg | ORAL_TABLET | Freq: Three times a day (TID) | ORAL | 0 refills | Status: DC

## 2019-01-01 NOTE — Progress Notes (Signed)
Subjective:    Patient ID: [name], female    DOB: 07/09/1951, 68 y.o.   MRN: [id number]  HPI  Virtual Visit via Video Note  I connected with [name] on 01/01/19 at 11:20 AM EDT by a video enabled telemedicine application and verified that I am speaking with the correct person using two identifiers.  Location: Patient: Home Provider: Office   I discussed the limitations of evaluation and management by telemedicine and the availability of in person appointments. The patient expressed understanding and agreed to proceed.  History of Present Illness:  [name] is a 68 year old female who presents today with a chief complaint of rash.  Her rash is located to the left upper thoracic back. Her symptoms began about one week ago with some itching and burning to the site. Two days ago she then noticed the burning and itching return so she scratched the site. Yesterday she noticed continued burning, stinging, itching so she had her husband take a picture of the area which revealed a rash.   She denies fevers, coming in contact with poison ivy/oak. She had Zostavax vaccination years ago, has not yet had Shingrix. She's not applied anything topically to the rash. She does notice filled bumps that are intact.    Observations/Objective:  Alert and oriented. Appears well, not sickly. No distress. Speaking in complete sentences. Raised, erythematous rash with several vesicles to left upper thoracic back. Two small spots proximally.   Assessment and Plan:  Exam and HPI consistent for Herpes Zoster. Rx for Valtrex 1 g TID x 7 day course sent to pharmacy. Discussed precautions including spreading to others if vesicles were to open. Discussed home care instructions. She will take Tylenol/Ibuprofen PRN pain. Follow up PRN.  Follow Up Instructions:  Start valacyclovir 1000 mg tablets for shingles rash. Take 1 tablet by mouth three times daily for seven days.  Keep the rash  covered to prevent opening of the vesicles.   You may take Tylenol or Ibuprofen as needed.   Please call us if your pain gets worse.  It was a pleasure meeting you! Rose Bossier, NP-C    I discussed the assessment and treatment plan with the patient. The patient was provided an opportunity to ask questions and all were answered. The patient agreed with the plan and demonstrated an understanding of the instructions.   The patient was advised to call back or seek an in-person evaluation if the symptoms worsen or if the condition fails to improve as anticipated.     Rose Koch, NP    Review of Systems  Constitutional: Negative for fever.  Respiratory: Negative for shortness of breath.   Skin: Positive for color change and rash. Negative for wound.       Past Medical History:  Diagnosis Date  . Depression   . Fatty liver   . Fibrocystic disease of both breasts   . GERD (gastroesophageal reflux disease)   . Hyperglycemia   . 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 Sphincter of Oddi dysfunction      Social History   Socioeconomic History  . Marital status: Married    Spouse name: Not on file  . Number of children: Not on file  . Years of education: Not on file  . Highest education level: Not on file  Occupational History  . Not on file  Social Needs  . Financial resource strain:  Not on file  . Food insecurity:    Worry: Not on file    Inability: Not on file  . Transportation needs:    Medical: Not on file    Non-medical: Not on file  Tobacco Use  . Smoking status: Never Smoker  . Smokeless tobacco: Never Used  Substance and Sexual Activity  . Alcohol use: Yes    Alcohol/week: 0.0 standard drinks    Comment: occ  . Drug use: No  . Sexual activity: Yes    Birth control/protection: Surgical  Lifestyle  . Physical activity:    Days per week: Not on file    Minutes per session: Not on file  . Stress:  Not on file  Relationships  . Social connections:    Talks on phone: Not on file    Gets together: Not on file    Attends religious service: Not on file    Active member of club or organization: Not on file    Attends meetings of clubs or organizations: Not on file    Relationship status: Not on file  . Intimate partner violence:    Fear of current or ex partner: Not on file    Emotionally abused: Not on file    Physically abused: Not on file    Forced sexual activity: Not on file  Other Topics Concern  . Not on file  Social History Narrative  . Not on file    Past Surgical History:  Procedure Laterality Date  . ABDOMINAL HYSTERECTOMY    . CARPAL TUNNEL RELEASE  08/2008   left  . ERCP    . LEFT HEART CATHETERIZATION WITH CORONARY ANGIOGRAM N/A 06/08/2013   Procedure: LEFT HEART CATHETERIZATION WITH CORONARY ANGIOGRAM;  Surgeon: Rose Dial, MD;  Location: [facility] CATH LAB;  Service: Cardiovascular;  Laterality: N/A;  . MASTECTOMY     bilateral for severe macrocystic breast    Family History  Problem Relation Age of Onset  . Hypertension Father   . Cancer Father        prostate and leukemia  . Cancer Maternal Aunt        Pancreatic  . Diabetes Maternal Uncle   . Heart disease Maternal Grandmother   . Brain cancer Mother        hospice  . Breast cancer Sister 61    Allergies  Allergen Reactions  . Atorvastatin     REACTION: rash  . Ezetimibe     REACTION: muscle aches  . Sulfamethoxazole-Trimethoprim Other (See Comments)    Swelling of nostrils    Current Outpatient Medications on File Prior to Visit  Medication Sig Dispense Refill  . acetaminophen (TYLENOL) 500 MG tablet Take 500 mg by mouth at bedtime.     . ALPRAZolam (XANAX) 1 MG tablet TAKE 0.5 TABLETS (0.5 MG TOTAL) BY MOUTH AT BEDTIME AS NEEDED FOR ANXIETY OR SLEEP. 15 tablet 3  . ASPIRIN PO Take 81 mg by mouth daily.    . Calcium-Magnesium-Vitamin D (CALCIUM 1200+D3 PO) Take 1 tablet by mouth at  bedtime.    . conjugated estrogens (PREMARIN) vaginal cream Quarter applicator qhs 2x/week 60 g 3  . cyclobenzaprine (FLEXERIL) 10 MG tablet Take 10 mg by mouth as needed.     . diclofenac sodium (VOLTAREN) 1 % GEL Apply 4 g topically 4 (four) times daily as needed. 3 Tube 3  . diphenhydrAMINE HCl (BENADRYL PO) Take 25 mg by mouth at bedtime.    Marland Kitchen MELATONIN PO Take  10 mg by mouth daily.    . Multiple Vitamins-Minerals (EYE VITAMINS PO) Take 1 capsule by mouth daily.    . Multiple Vitamins-Minerals (MULTIVITAMIN GUMMIES ADULT PO) Take by mouth daily.    . nitroGLYCERIN (NITROSTAT) 0.4 MG SL tablet Place 1 tablet (0.4 mg total) under the tongue every 5 (five) minutes as needed for chest pain. 100 tablet 3  . Omega-3 Fatty Acids (OMEGA-3 FISH OIL PO) Take 1,000 mg by mouth daily.    . pantoprazole (PROTONIX) 40 MG tablet Take 1 tablet (40 mg total) by mouth 2 (two) times daily. 180 tablet 3  . Probiotic Product (PROBIOTIC PO) Take 1 capsule by mouth daily.    . Pyridoxine HCl (VITAMIN B-6 PO) Take 100 mg by mouth daily.     No current facility-administered medications on file prior to visit.     There were no vitals taken for this visit.   Objective:   Physical Exam  Constitutional: She is oriented to person, place, and time. She appears well-nourished.  Respiratory: Effort normal.  Neurological: She is alert and oriented to person, place, and time.  Skin: Skin is dry. Rash noted. There is erythema.  Raised, erythematous rash with several vesicles to left upper thoracic back. Two small spots proximally.  Psychiatric: She has a normal mood and affect.           Assessment & Plan:

## 2019-01-01 NOTE — Patient Instructions (Signed)
Start valacyclovir 1000 mg tablets for shingles rash. Take 1 tablet by mouth three times daily for seven days.  Keep the rash covered to prevent opening of the vesicles.   You may take Tylenol or Ibuprofen as needed.   Please call us if your pain gets worse.  It was a pleasure meeting you! [name], NP-C

## 2019-04-02 ENCOUNTER — Other Ambulatory Visit: Payer: Self-pay | Admitting: Family Medicine

## 2019-04-02 NOTE — Telephone Encounter (Signed)
Name of Medication:xanax Name of [facility]acy[facility] or Written Date and Quantity:12/05/18 #15 tabs with 3 refills Last Office Visit and Type:Rash on 01/01/19 Next Office Visit and Type:CPE on 06/14/19 (AWV scheduled too) Last Controlled Substance Agreement Date:none on file Last VVK:PQAE on file

## 2019-04-06 ENCOUNTER — Ambulatory Visit (INDEPENDENT_AMBULATORY_CARE_PROVIDER_SITE_OTHER): Payer: Medicare Other | Admitting: Family Medicine

## 2019-04-06 ENCOUNTER — Encounter: Payer: Self-pay | Admitting: Family Medicine

## 2019-04-06 ENCOUNTER — Other Ambulatory Visit: Payer: Self-pay

## 2019-04-06 ENCOUNTER — Ambulatory Visit: Payer: Medicare Other | Admitting: Family Medicine

## 2019-04-06 VITALS — BP 135/72 | HR 56 | Temp 98.0°F | Ht 63.5 in | Wt 162.6 lb

## 2019-04-06 DIAGNOSIS — R7303 Prediabetes: Secondary | ICD-10-CM | POA: Diagnosis not present

## 2019-04-06 DIAGNOSIS — M79671 Pain in right foot: Secondary | ICD-10-CM | POA: Insufficient documentation

## 2019-04-06 DIAGNOSIS — R5383 Other fatigue: Secondary | ICD-10-CM | POA: Insufficient documentation

## 2019-04-06 DIAGNOSIS — R5382 Chronic fatigue, unspecified: Secondary | ICD-10-CM | POA: Diagnosis not present

## 2019-04-06 DIAGNOSIS — R519 Headache, unspecified: Secondary | ICD-10-CM | POA: Insufficient documentation

## 2019-04-06 DIAGNOSIS — R51 Headache: Secondary | ICD-10-CM | POA: Diagnosis not present

## 2019-04-06 DIAGNOSIS — R202 Paresthesia of skin: Secondary | ICD-10-CM

## 2019-04-06 DIAGNOSIS — M79672 Pain in left foot: Secondary | ICD-10-CM

## 2019-04-06 LAB — COMPREHENSIVE METABOLIC PANEL
ALT: 19 U/L (ref 0–35)
AST: 15 U/L (ref 0–37)
Albumin: 4.6 g/dL (ref 3.5–5.2)
Alkaline Phosphatase: 47 U/L (ref 39–117)
BUN: 26 mg/dL — ABNORMAL HIGH (ref 6–23)
CO2: 28 mEq/L (ref 19–32)
Calcium: 9.8 mg/dL (ref 8.4–10.5)
Chloride: 103 mEq/L (ref 96–112)
Creatinine, Ser: 0.65 mg/dL (ref 0.40–1.20)
GFR: 90.23 mL/min (ref >60.00)
Glucose, Bld: 110 mg/dL — ABNORMAL HIGH (ref 70–99)
Potassium: 4.6 mEq/L (ref 3.5–5.1)
Sodium: 138 mEq/L (ref 135–145)
Total Bilirubin: 0.7 mg/dL (ref 0.2–1.2)
Total Protein: 7.3 g/dL (ref 6.0–8.3)

## 2019-04-06 LAB — CBC WITH DIFFERENTIAL/PLATELET
Basophils Absolute: 0.1 10*3/uL (ref 0.0–0.1)
Basophils Relative: 1.1 % (ref 0.0–3.0)
Eosinophils Absolute: 0.2 10*3/uL (ref 0.0–0.7)
Eosinophils Relative: 3.2 % (ref 0.0–5.0)
HCT: 40 % (ref 36.0–46.0)
Hemoglobin: 13.1 g/dL (ref 12.0–15.0)
Lymphocytes Relative: 38.9 % (ref 12.0–46.0)
Lymphs Abs: 2.2 10*3/uL (ref 0.7–4.0)
MCHC: 32.8 g/dL (ref 30.0–36.0)
MCV: 88.4 fl (ref 78.0–100.0)
Monocytes Absolute: 0.5 10*3/uL (ref 0.1–1.0)
Monocytes Relative: 9.3 % (ref 3.0–12.0)
Neutro Abs: 2.7 10*3/uL (ref 1.4–7.7)
Neutrophils Relative %: 47.5 % (ref 43.0–77.0)
Platelets: 276 10*3/uL (ref 150.0–400.0)
RBC: 4.52 Mil/uL (ref 3.87–5.11)
RDW: 13.9 % (ref 11.5–15.5)
WBC: 5.7 10*3/uL (ref 4.0–10.5)

## 2019-04-06 LAB — TSH: TSH: 3.58 u[IU]/mL (ref 0.35–4.50)

## 2019-04-06 LAB — HEMOGLOBIN A1C: Hgb A1c MFr Bld: 6.3 % (ref 4.6–6.5)

## 2019-04-06 LAB — VITAMIN B12: Vitamin B-12: 357 pg/mL (ref 211–911)

## 2019-04-06 NOTE — Assessment & Plan Note (Signed)
With fluctuating glucose  Exercise on and off  Feeling of head pressure/not pain  Some paresthesia in feet  Lab today

## 2019-04-06 NOTE — Patient Instructions (Addendum)
To prevent diabetes Try to get most of your carbohydrates from produce (with the exception of white potatoes)  Eat less bread/pasta/rice/snack foods/cereals/sweets and other items from the middle of the grocery store (processed carbs)   Take care of yourself  Keep eating well  Brainstorm some exercise that is easier on feet  Yoga/ pool/bike  Go buy some new athletic shoes also   Labs today

## 2019-04-06 NOTE — Assessment & Plan Note (Signed)
Lab today  Pt is fatigued and some neuropathy symptoms at night A1C today  At home glucose readings are borderline Diet is good Needs to find exercise that does not hurt feet

## 2019-04-06 NOTE — Assessment & Plan Note (Signed)
Nl exam  ? Headache cause/ barometric pressure  May be related to fatigue and blood pressure

## 2019-04-06 NOTE — Progress Notes (Signed)
Subjective:    Patient ID: [name], female    DOB: Sep 11, 1950, 68 y.o.   MRN: [id number]  HPI Here for blood sugar issues and numbness in R great toe- (tip of it)   No injury to foot No back pain  Trying to walk 2 mi per day- her feet hurt so she stopped 2 weeks ago  Mostly in achilles area and base of toes   Wearing athletic shoes with inserts  Walking [place] acres and unlevel ground/rocks etc   For 2 weeks- has constant pressure in her head  Thought her sugar may be up  She was fitted at the shoe market    Using a monitor  Fasting 118-120  This am 105   2 hours after eating around 140   Bedtime-is usually around 120   No excessive thirst or urination at all  A little fatigued   Under a lot of stress -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  Watches too much news re: pandemic  Has a son in [place], daughter is nicu nurse- always worried   Prediabetes  Lab Results  Component Value Date   HGBA1C 6.4 06/06/2018     Wt Readings from Last 3 Encounters:  04/06/19 162 lb 9 oz (73.7 kg)  06/09/18 160 lb 4 oz (72.7 kg)  06/06/18 161 lb (73 kg)  thought she has gained wt since feb =perhaps lost some and then gained it back  28.34 kg/m   BP Readings from Last 3 Encounters:  04/06/19 (!) 144/84  06/09/18 130/76  06/06/18 126/88   Pulse Readings from Last 3 Encounters:  04/06/19 (!) 56  06/09/18 80  06/06/18 73    Eating - fair  Whole wheat bread if any  Avoiding sweets for 2 weeks  Protein-eggs  Some bacon Louanne Skye  Cottage cheese  Yogurt  Milk  Chicken/fish  Eats well   Both parents type 2 DM [place]- dm Grandson type 1 dm   Patient Active Problem List   Diagnosis Date Noted  . Fatigue 04/06/2019  . Pressure in head 04/06/2019  . Paresthesia 04/06/2019  . Vaginal atrophy 06/09/2018  . Thoracic back pain 09/27/2017  . Skin lesion 08/11/2017  . Breast cancer screening 05/31/2017  . Prediabetes 06/02/2016  . Stress reaction 05/10/2016  .  Estrogen deficiency 01/29/2015  . Routine general medical examination at a health care facility 02/20/2012  . DEPRESSION 06/20/2008  . GERD 06/20/2008   Past Medical History:  Diagnosis Date  . Depression   . Fatty liver   . Fibrocystic disease of both breasts   . GERD (gastroesophageal reflux disease)   . Hyperglycemia   . 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 Sphincter of Oddi dysfunction    Past Surgical History:  Procedure Laterality Date  . ABDOMINAL HYSTERECTOMY    . CARPAL TUNNEL RELEASE  08/2008   left  . ERCP    . LEFT HEART CATHETERIZATION WITH CORONARY ANGIOGRAM N/A 06/08/2013   Procedure: LEFT HEART CATHETERIZATION WITH CORONARY ANGIOGRAM;  Surgeon: Ramond Dial, MD;  Location: [facility] CATH LAB;  Service: Cardiovascular;  Laterality: N/A;  . MASTECTOMY     bilateral for severe macrocystic breast   Social History   Tobacco Use  . Smoking status: Never Smoker  . Smokeless tobacco: Never Used  Substance Use Topics  . Alcohol use: Yes    Alcohol/week: 0.0 standard drinks    Comment: occ  .  Drug use: No   Family History  Problem Relation Age of Onset  . Hypertension Father   . Cancer Father        prostate and leukemia  . Cancer Maternal Aunt        Pancreatic  . Diabetes Maternal Uncle   . Heart disease Maternal Grandmother   . Brain cancer Mother        hospice  . Breast cancer Sister 72   Allergies  Allergen Reactions  . Atorvastatin     REACTION: rash  . Ezetimibe     REACTION: muscle aches  . Sulfamethoxazole-Trimethoprim Other (See Comments)    Swelling of nostrils   Current Outpatient Medications on File Prior to Visit  Medication Sig Dispense Refill  . acetaminophen (TYLENOL) 500 MG tablet Take 500 mg by mouth at bedtime.     . ALPRAZolam (XANAX) 1 MG tablet TAKE 0.5 TABLETS (0.5 MG TOTAL) BY MOUTH AT BEDTIME AS NEEDED FOR ANXIETY OR SLEEP. 15 tablet 3  . ASPIRIN PO Take 81 mg by  mouth daily.    . Calcium-Magnesium-Vitamin D (CALCIUM 1200+D3 PO) Take 1 tablet by mouth at bedtime.    . conjugated estrogens (PREMARIN) vaginal cream Quarter applicator qhs 2x/week 60 g 3  . diclofenac sodium (VOLTAREN) 1 % GEL Apply 4 g topically 4 (four) times daily as needed. 3 Tube 3  . diphenhydrAMINE HCl (BENADRYL PO) Take 25 mg by mouth at bedtime.    Marland Kitchen MELATONIN PO Take 10 mg by mouth daily.    . Multiple Vitamins-Minerals (EYE VITAMINS PO) Take 1 capsule by mouth daily.    . Multiple Vitamins-Minerals (MULTIVITAMIN GUMMIES ADULT PO) Take by mouth daily.    . Omega-3 Fatty Acids (OMEGA-3 FISH OIL PO) Take 1,000 mg by mouth daily.    . pantoprazole (PROTONIX) 40 MG tablet Take 1 tablet (40 mg total) by mouth 2 (two) times daily. 180 tablet 3  . Probiotic Product (PROBIOTIC PO) Take 1 capsule by mouth daily.    . Pyridoxine HCl (VITAMIN B-6 PO) Take 100 mg by mouth daily.     No current facility-administered medications on file prior to visit.      Review of Systems  Constitutional: Positive for fatigue. Negative for activity change, appetite change, fever and unexpected weight change.  HENT: Negative for congestion, ear pain, rhinorrhea, sinus pressure and sore throat.   Eyes: Negative for pain, redness and visual disturbance.  Respiratory: Negative for cough, shortness of breath and wheezing.   Cardiovascular: Negative for chest pain and palpitations.  Gastrointestinal: Negative for abdominal pain, blood in stool, constipation and diarrhea.  Endocrine: Negative for cold intolerance, heat intolerance, polydipsia, polyphagia and polyuria.  Genitourinary: Negative for dysuria, frequency and urgency.  Musculoskeletal: Negative for arthralgias, back pain and myalgias.       Foot pain  Skin: Negative for pallor and rash.  Allergic/Immunologic: Negative for environmental allergies.  Neurological: Positive for numbness and headaches. Negative for dizziness and syncope.       Head  pressure  Hematological: Negative for adenopathy. Does not bruise/bleed easily.  Psychiatric/Behavioral: Negative for decreased concentration and dysphoric mood. The patient is not nervous/anxious.        Objective:   Physical Exam Constitutional:      General: She is not in acute distress.    Appearance: Normal appearance. She is not ill-appearing.  HENT:     Head: Normocephalic and atraumatic.     Nose: Nose normal.     Mouth/Throat:  Mouth: Mucous membranes are moist.     Pharynx: Oropharynx is clear.  Eyes:     General: No scleral icterus.    Extraocular Movements: Extraocular movements intact.     Conjunctiva/sclera: Conjunctivae normal.     Pupils: Pupils are equal, round, and reactive to light.  Neck:     Musculoskeletal: Normal range of motion and neck supple.  Cardiovascular:     Rate and Rhythm: Normal rate and regular rhythm.     Pulses: Normal pulses.     Heart sounds: Normal heart sounds.  Pulmonary:     Effort: Pulmonary effort is normal. No respiratory distress.     Breath sounds: Normal breath sounds. No stridor. No wheezing or rales.  Abdominal:     General: Abdomen is flat. Bowel sounds are normal. There is no distension.     Palpations: There is no mass.     Tenderness: There is no abdominal tenderness.  Musculoskeletal: Normal range of motion.        General: Tenderness present.     Right lower leg: No edema.     Left lower leg: No edema.     Comments: Tender at base of achilles tendon- R foot more than the L Also plantar surface under heads of metatarsals No joint swelling  Lymphadenopathy:     Cervical: No cervical adenopathy.  Skin:    General: Skin is warm and dry.     Coloration: Skin is not jaundiced or pale.     Findings: No erythema or rash.  Neurological:     Mental Status: She is alert.     Sensory: Sensory deficit present.     Motor: No weakness.     Coordination: Coordination normal.     Gait: Gait normal.     Deep Tendon  Reflexes: Reflexes normal.     Comments: R great toe has slt decreased sens to soft touch  Psychiatric:        Mood and Affect: Mood normal.           Assessment & Plan:   Problem List Items Addressed This Visit      Other   Prediabetes - Primary    Lab today  Pt is fatigued and some neuropathy symptoms at night A1C today  At home glucose readings are borderline Diet is good Needs to find exercise that does not hurt feet      Relevant Orders   Hemoglobin A1c   Fatigue    With fluctuating glucose  Exercise on and off  Feeling of head pressure/not pain  Some paresthesia in feet  Lab today      Relevant Orders   CBC with Differential/Platelet   Comprehensive metabolic panel   TSH   Vitamin B12   Pressure in head    Nl exam  ? Headache cause/ barometric pressure  May be related to fatigue and blood pressure       Paresthesia    R great toe is 1/2 "numb"-not completely (suspect this may be from shoe trauma/walking) Also some burning of both feet at night- ? Neuropathy Lab today   Also c/o foot pain -suspect more overuse/shoe issues      Relevant Orders   Vitamin B12   Foot pain, bilateral    Achilles and at base of toes/plantar  Some am pain  Worse since walking in new shoes  May need to be fitted to different shoes/orthotics

## 2019-04-06 NOTE — Assessment & Plan Note (Signed)
R great toe is 1/2 "numb"-not completely (suspect this may be from shoe trauma/walking) Also some burning of both feet at night- ? Neuropathy Lab today   Also c/o foot pain -suspect more overuse/shoe issues

## 2019-04-06 NOTE — Assessment & Plan Note (Signed)
Achilles and at base of toes/plantar  Some am pain  Worse since walking in new shoes  May need to be fitted to different shoes/orthotics

## 2019-04-09 ENCOUNTER — Telehealth: Payer: Self-pay | Admitting: Family Medicine

## 2019-04-09 DIAGNOSIS — M79671 Pain in right foot: Secondary | ICD-10-CM

## 2019-04-09 DIAGNOSIS — M79672 Pain in left foot: Secondary | ICD-10-CM

## 2019-04-09 NOTE — Telephone Encounter (Signed)
Done Will route to [facility]

## 2019-04-09 NOTE — Telephone Encounter (Signed)
-----   Message from [name], [place] sent at 04/09/2019  4:29 PM EDT ----- Pt viewed her results on mychart. 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 Pt already sees Dr. Rhona Raider at [facility] and she knows they have a "foot doctor" there in that practice . Pt would like to be referred to [facility] to f/u of foot issues. If okay please put referral in and I advised pt our [facility] will call to schedule appt

## 2019-04-10 NOTE — Telephone Encounter (Signed)
Called [facility] and patient owes $4.86 , they wouldn't schedule until patient pays. Patient will call and schedule her own appt with Dr Lucia Gaskins.

## 2019-04-27 ENCOUNTER — Other Ambulatory Visit: Payer: Self-pay

## 2019-04-27 ENCOUNTER — Ambulatory Visit (INDEPENDENT_AMBULATORY_CARE_PROVIDER_SITE_OTHER): Payer: Medicare Other | Admitting: Family Medicine

## 2019-04-27 ENCOUNTER — Encounter: Payer: Self-pay | Admitting: Family Medicine

## 2019-04-27 DIAGNOSIS — Z23 Encounter for immunization: Secondary | ICD-10-CM | POA: Diagnosis not present

## 2019-04-27 DIAGNOSIS — L089 Local infection of the skin and subcutaneous tissue, unspecified: Secondary | ICD-10-CM | POA: Diagnosis not present

## 2019-04-27 DIAGNOSIS — S80811A Abrasion, right lower leg, initial encounter: Secondary | ICD-10-CM | POA: Diagnosis not present

## 2019-04-27 MED ORDER — CEPHALEXIN 500 MG PO CAPS: 500.0000 mg | ORAL_CAPSULE | Freq: Three times a day (TID) | ORAL | 0 refills | Status: DC

## 2019-04-27 NOTE — Patient Instructions (Signed)
Use soap and water  A warm or cool compress may help  Over the counter antibiotic ointment   Take generic keflex as directed  Tetanus shot today

## 2019-04-27 NOTE — Progress Notes (Signed)
Subjective:    Patient ID: [name], female    DOB: [birth year], 68 y.o.   MRN: [id number]  HPI Here for leg injury Thinks from a fish hook on Tuesday (4 d ago_  Thinks she scraped a hook  R leg- medial to shin  It bled a bit  Cleaned with hand sanitizer  Noticed wed/thurs it bothered her and red  Red/swollen and warm now Using bacitracin otc  Using soap and water   Tdap was 9/15   Patient Active Problem List   Diagnosis Date Noted  . Skin infection 04/27/2019  . Abrasion, leg w/ infection 04/27/2019  . Fatigue 04/06/2019  . Pressure in head 04/06/2019  . Paresthesia 04/06/2019  . Foot pain, bilateral 04/06/2019  . Vaginal atrophy 06/09/2018  . Thoracic back pain 09/27/2017  . Skin lesion 08/11/2017  . Breast cancer screening 05/31/2017  . Prediabetes 06/02/2016  . Stress reaction 05/10/2016  . Estrogen deficiency 01/29/2015  . Routine general medical examination at a health care facility 02/20/2012  . DEPRESSION 06/20/2008  . GERD 06/20/2008   Past Medical History:  Diagnosis Date  . Depression   . Fatty liver   . Fibrocystic disease of both breasts   . GERD (gastroesophageal reflux disease)   . Hyperglycemia   . 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 Sphincter of Oddi dysfunction    Past Surgical History:  Procedure Laterality Date  . ABDOMINAL HYSTERECTOMY    . CARPAL TUNNEL RELEASE  08/2008   left  . ERCP    . LEFT HEART CATHETERIZATION WITH CORONARY ANGIOGRAM N/A 06/08/2013   Procedure: LEFT HEART CATHETERIZATION WITH CORONARY ANGIOGRAM;  Surgeon: Ramond Dial, MD;  Location: [name] C[facility] LAB;  Service: Cardiovascular;  Laterality: N/A;  . MASTECTOMY     bilateral for severe macrocystic breast   Social History   Tobacco Use  . Smoking status: Never Smoker  . Smokeless tobacco: Never Used  Substance Use Topics  . Alcohol use: Yes    Alcohol/week: 0.0 standard drinks    Comment: occ   . Drug use: No   Family History  Problem Relation Age of Onset  . Hypertension Father   . Cancer Father        prostate and leukemia  . Cancer Maternal Aunt        Pancreatic  . Diabetes Maternal Uncle   . Heart disease Maternal Grandmother   . Brain cancer Mother        hospice  . Breast cancer Sister 67   Allergies  Allergen Reactions  . Atorvastatin     REACTION: rash  . Ezetimibe     REACTION: muscle aches  . Sulfamethoxazole-Trimethoprim Other (See Comments)    Swelling of nostrils   Current Outpatient Medications on File Prior to Visit  Medication Sig Dispense Refill  . acetaminophen (TYLENOL) 500 MG tablet Take 500 mg by mouth at bedtime.     . ALPRAZolam (XANAX) 1 MG tablet TAKE 0.5 TABLETS (0.5 MG TOTAL) BY MOUTH AT BEDTIME AS NEEDED FOR ANXIETY OR SLEEP. 15 tablet 3  . ASPIRIN PO Take 81 mg by mouth daily.    . Calcium-Magnesium-Vitamin D (CALCIUM 1200+D3 PO) Take 1 tablet by mouth at bedtime.    . conjugated estrogens (PREMARIN) vaginal cream Quarter applicator qhs 2x/week 60 g 3  . cyanocobalamin 100 MCG tablet Take 100 mcg by mouth daily.    . diclofenac sodium (  VOLTAREN) 1 % GEL Apply 4 g topically 4 (four) times daily as needed. 3 Tube 3  . diphenhydrAMINE HCl (BENADRYL PO) Take 25 mg by mouth at bedtime.    Marland Kitchen MELATONIN PO Take 10 mg by mouth daily.    . Multiple Vitamins-Minerals (EYE VITAMINS PO) Take 1 capsule by mouth daily.    . Multiple Vitamins-Minerals (MULTIVITAMIN GUMMIES ADULT PO) Take by mouth daily.    . Omega-3 Fatty Acids (OMEGA-3 FISH OIL PO) Take 1,000 mg by mouth daily.    . pantoprazole (PROTONIX) 40 MG tablet Take 1 tablet (40 mg total) by mouth 2 (two) times daily. 180 tablet 3  . Probiotic Product (PROBIOTIC PO) Take 1 capsule by mouth daily.     No current facility-administered medications on file prior to visit.     Review of Systems  Constitutional: Negative for activity change, appetite change, fatigue, fever and unexpected weight  change.  HENT: Negative for congestion, ear pain, rhinorrhea, sinus pressure and sore throat.   Eyes: Negative for pain, redness and visual disturbance.  Respiratory: Negative for cough, shortness of breath and wheezing.   Cardiovascular: Negative for chest pain and palpitations.  Gastrointestinal: Negative for abdominal pain, blood in stool, constipation and diarrhea.  Endocrine: Negative for polydipsia and polyuria.  Genitourinary: Negative for dysuria, frequency and urgency.  Musculoskeletal: Negative for arthralgias, back pain and myalgias.  Skin: Positive for wound. Negative for pallor and rash.  Allergic/Immunologic: Negative for environmental allergies.  Neurological: Negative for dizziness, syncope and headaches.  Hematological: Negative for adenopathy. Does not bruise/bleed easily.  Psychiatric/Behavioral: Negative for decreased concentration and dysphoric mood. The patient is not nervous/anxious.        Objective:   Physical Exam Constitutional:      General: She is not in acute distress.    Appearance: Normal appearance. She is not ill-appearing.  HENT:     Mouth/Throat:     Mouth: Mucous membranes are moist.  Eyes:     General: No scleral icterus.    Conjunctiva/sclera: Conjunctivae normal.     Pupils: Pupils are equal, round, and reactive to light.  Neck:     Musculoskeletal: Normal range of motion and neck supple.  Cardiovascular:     Rate and Rhythm: Normal rate and regular rhythm.     Pulses: Normal pulses.  Pulmonary:     Effort: Pulmonary effort is normal. No respiratory distress.     Breath sounds: Normal breath sounds. No wheezing or rales.  Lymphadenopathy:     Cervical: No cervical adenopathy.  Skin:    Findings: Erythema present.     Comments: 3 cm round area of erythema and induration surrounding a small abrasion - medial RLE Mildly tender warm slt ext of redness towards medial ankle  Neurological:     Mental Status: She is alert.     Sensory: No  sensory deficit.  Psychiatric:        Mood and Affect: Mood normal.           Assessment & Plan:   Problem List Items Addressed This Visit      Musculoskeletal and Integument   Skin infection   Relevant Medications   cephALEXin (KEFLEX) 500 MG capsule   Other Relevant Orders   Td : Tetanus/diphtheria >7yo Preservative  free (Completed)   Abrasion, leg w/ infection    Wound from fish hook scratch- looks like early infection  tx with cephalexin  Soap and water /abx oint Tetanus shot today Update if  not starting to improve in a week or if worsening        Relevant Medications   cephALEXin (KEFLEX) 500 MG capsule   Other Relevant Orders   Td : Tetanus/diphtheria >7yo Preservative  free (Completed)

## 2019-04-29 NOTE — Assessment & Plan Note (Signed)
Wound from fish hook scratch- looks like early infection  tx with cephalexin  Soap and water /abx oint Tetanus shot today Update if not starting to improve in a week or if worsening

## 2019-05-21 ENCOUNTER — Other Ambulatory Visit: Payer: Self-pay | Admitting: Family Medicine

## 2019-06-07 ENCOUNTER — Telehealth: Payer: Self-pay | Admitting: Family Medicine

## 2019-06-07 DIAGNOSIS — R7303 Prediabetes: Secondary | ICD-10-CM

## 2019-06-07 DIAGNOSIS — Z0000 Encounter for general adult medical examination without abnormal findings: Secondary | ICD-10-CM

## 2019-06-07 NOTE — Telephone Encounter (Signed)
-----   Message from Ellamae Sia sent at 06/01/2019 10:06 AM EDT ----- Regarding: Lab orders for Friday, 10.9.20 Patient is scheduled for CPX labs, please order future labs, Thanks , Karna Christmas

## 2019-06-08 ENCOUNTER — Ambulatory Visit: Payer: Medicare Other

## 2019-06-08 ENCOUNTER — Other Ambulatory Visit: Payer: Self-pay

## 2019-06-08 ENCOUNTER — Other Ambulatory Visit (INDEPENDENT_AMBULATORY_CARE_PROVIDER_SITE_OTHER): Payer: Medicare Other

## 2019-06-08 DIAGNOSIS — Z0000 Encounter for general adult medical examination without abnormal findings: Secondary | ICD-10-CM

## 2019-06-08 DIAGNOSIS — R7303 Prediabetes: Secondary | ICD-10-CM

## 2019-06-08 LAB — COMPREHENSIVE METABOLIC PANEL
ALT: 21 U/L (ref 0–35)
AST: 16 U/L (ref 0–37)
Albumin: 4.5 g/dL (ref 3.5–5.2)
Alkaline Phosphatase: 51 U/L (ref 39–117)
BUN: 20 mg/dL (ref 6–23)
CO2: 29 mEq/L (ref 19–32)
Calcium: 9.9 mg/dL (ref 8.4–10.5)
Chloride: 103 mEq/L (ref 96–112)
Creatinine, Ser: 0.65 mg/dL (ref 0.40–1.20)
GFR: 90.18 mL/min (ref >60.00)
Glucose, Bld: 104 mg/dL — ABNORMAL HIGH (ref 70–99)
Potassium: 4.2 mEq/L (ref 3.5–5.1)
Sodium: 139 mEq/L (ref 135–145)
Total Bilirubin: 1.1 mg/dL (ref 0.2–1.2)
Total Protein: 7.3 g/dL (ref 6.0–8.3)

## 2019-06-08 LAB — CBC WITH DIFFERENTIAL/PLATELET
Basophils Absolute: 0 10*3/uL (ref 0.0–0.1)
Basophils Relative: 0.7 % (ref 0.0–3.0)
Eosinophils Absolute: 0.2 10*3/uL (ref 0.0–0.7)
Eosinophils Relative: 3.2 % (ref 0.0–5.0)
HCT: 41 % (ref 36.0–46.0)
Hemoglobin: 13.4 g/dL (ref 12.0–15.0)
Lymphocytes Relative: 43.5 % (ref 12.0–46.0)
Lymphs Abs: 2.6 10*3/uL (ref 0.7–4.0)
MCHC: 32.7 g/dL (ref 30.0–36.0)
MCV: 88.5 fl (ref 78.0–100.0)
Monocytes Absolute: 0.5 10*3/uL (ref 0.1–1.0)
Monocytes Relative: 9 % (ref 3.0–12.0)
Neutro Abs: 2.6 10*3/uL (ref 1.4–7.7)
Neutrophils Relative %: 43.6 % (ref 43.0–77.0)
Platelets: 293 10*3/uL (ref 150.0–400.0)
RBC: 4.63 Mil/uL (ref 3.87–5.11)
RDW: 13.5 % (ref 11.5–15.5)
WBC: 6.1 10*3/uL (ref 4.0–10.5)

## 2019-06-08 LAB — LIPID PANEL
Cholesterol: 191 mg/dL (ref 0–200)
HDL: 62.1 mg/dL (ref >39.00)
LDL Cholesterol: 92 mg/dL (ref 0–99)
NonHDL: 128.46
Total CHOL/HDL Ratio: 3
Triglycerides: 180 mg/dL — ABNORMAL HIGH (ref 0.0–149.0)
VLDL: 36 mg/dL (ref 0.0–40.0)

## 2019-06-08 LAB — HEMOGLOBIN A1C: Hgb A1c MFr Bld: 6.4 % (ref 4.6–6.5)

## 2019-06-08 LAB — TSH: TSH: 3.02 u[IU]/mL (ref 0.35–4.50)

## 2019-06-14 ENCOUNTER — Other Ambulatory Visit: Payer: Self-pay

## 2019-06-14 ENCOUNTER — Ambulatory Visit (INDEPENDENT_AMBULATORY_CARE_PROVIDER_SITE_OTHER): Payer: Medicare Other | Admitting: Family Medicine

## 2019-06-14 ENCOUNTER — Encounter: Payer: Self-pay | Admitting: Family Medicine

## 2019-06-14 DIAGNOSIS — R05 Cough: Secondary | ICD-10-CM | POA: Diagnosis not present

## 2019-06-14 DIAGNOSIS — Z20822 Contact with and (suspected) exposure to covid-19: Secondary | ICD-10-CM

## 2019-06-14 DIAGNOSIS — R058 Other specified cough: Secondary | ICD-10-CM | POA: Insufficient documentation

## 2019-06-14 NOTE — Patient Instructions (Signed)
Drink fluids and rest  Go get tested for covid at the [facility] site Isolate yourself until you get a result  Treat symptoms - expectorant plus DM or delsym will help cough Tylenol for pain or fever  Alert [place] if worse or new symptoms If suddenly severe-go to the ER

## 2019-06-14 NOTE — Progress Notes (Signed)
Virtual Visit via Video Note  I connected with [name] on 06/14/19 at  8:00 AM EDT by a video enabled telemedicine application and verified that I am speaking with the correct person using two identifiers.  Location: Patient: home Provider: office    I discussed the limitations of evaluation and management by telemedicine and the availability of in person appointments. The patient expressed understanding and agreed to proceed.  Persons participating in visit : [name]- patient Loura Pardon MD- physician (treating)   History of Present Illness: Has a mild cough- on and off -starting on Monday  Does not feel good  Tired  Got winded/tired trying to sweep yesterday  Upper back hurts  Upper body aches  Skin hurts a little  No fever- 97.0 Is hot and cold all the time   No nasal symptoms  Throat is scratchy-not red looking  Ears -yesterday felt funny /itchy  Head feels "not right" - perhaps headache  Light headed one night - that is better   Cough is dry  Not wheezing  Chest is not tight   No GI symptom   She is drinking fluids Appetite is ok  No loss of taste or smell   Last week- had 3 grand kids with birthdays  A grand child's boyfriend did not feel well/did not stay long They did not mask   Patient Active Problem List   Diagnosis Date Noted  . Dry cough 06/14/2019  . Skin infection 04/27/2019  . Abrasion, leg w/ infection 04/27/2019  . Fatigue 04/06/2019  . Pressure in head 04/06/2019  . Paresthesia 04/06/2019  . Foot pain, bilateral 04/06/2019  . Vaginal atrophy 06/09/2018  . Thoracic back pain 09/27/2017  . Skin lesion 08/11/2017  . Breast cancer screening 05/31/2017  . Prediabetes 06/02/2016  . Stress reaction 05/10/2016  . Estrogen deficiency 01/29/2015  . Routine general medical examination at a health care facility 02/20/2012  . DEPRESSION 06/20/2008  . GERD 06/20/2008   Past Medical History:  Diagnosis Date  . Depression   . Fatty  liver   . Fibrocystic disease of both breasts   . GERD (gastroesophageal reflux disease)   . Hyperglycemia   . 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 Sphincter of Oddi dysfunction    Past Surgical History:  Procedure Laterality Date  . ABDOMINAL HYSTERECTOMY    . CARPAL TUNNEL RELEASE  08/2008   left  . ERCP    . LEFT HEART CATHETERIZATION WITH CORONARY ANGIOGRAM N/A 06/08/2013   Procedure: LEFT HEART CATHETERIZATION WITH CORONARY ANGIOGRAM;  Surgeon: Ramond Dial, MD;  Location: [facility] CATH LAB;  Service: Cardiovascular;  Laterality: N/A;  . MASTECTOMY     bilateral for severe macrocystic breast   Social History   Tobacco Use  . Smoking status: Never Smoker  . Smokeless tobacco: Never Used  Substance Use Topics  . Alcohol use: Yes    Alcohol/week: 0.0 standard drinks    Comment: occ  . Drug use: No   Family History  Problem Relation Age of Onset  . Hypertension Father   . Cancer Father        prostate and leukemia  . Cancer Maternal Aunt        Pancreatic  . Diabetes Maternal Uncle   . Heart disease Maternal Grandmother   . Brain cancer Mother        hospice  . Breast cancer Sister 22  Allergies  Allergen Reactions  . Atorvastatin     REACTION: rash  . Ezetimibe     REACTION: muscle aches  . Sulfamethoxazole-Trimethoprim Other (See Comments)    Swelling of nostrils   Current Outpatient Medications on File Prior to Visit  Medication Sig Dispense Refill  . acetaminophen (TYLENOL) 500 MG tablet Take 500 mg by mouth at bedtime.     . ALPRAZolam (XANAX) 1 MG tablet TAKE 0.5 TABLETS (0.5 MG TOTAL) BY MOUTH AT BEDTIME AS NEEDED FOR ANXIETY OR SLEEP. 15 tablet 3  . ASPIRIN PO Take 81 mg by mouth daily.    . Calcium-Magnesium-Vitamin D (CALCIUM 1200+D3 PO) Take 1 tablet by mouth at bedtime.    . conjugated estrogens (PREMARIN) vaginal cream Quarter applicator qhs 2x/week 60 g 3  . cyanocobalamin 100 MCG tablet  Take 100 mcg by mouth daily.    . diclofenac sodium (VOLTAREN) 1 % GEL Apply 4 g topically 4 (four) times daily as needed. 3 Tube 3  . MELATONIN PO Take 10 mg by mouth daily.    . Multiple Vitamins-Minerals (EYE VITAMINS PO) Take 1 capsule by mouth daily.    . Multiple Vitamins-Minerals (MULTIVITAMIN GUMMIES ADULT PO) Take by mouth daily.    . Omega-3 Fatty Acids (OMEGA-3 FISH OIL PO) Take 1,000 mg by mouth daily.    . pantoprazole (PROTONIX) 40 MG tablet TAKE 1 TABLET BY MOUTH TWO  TIMES DAILY 180 tablet 0  . Probiotic Product (PROBIOTIC PO) Take 1 capsule by mouth daily.    . Zinc 50 MG CAPS Take 1 capsule by mouth daily.     No current facility-administered medications on file prior to visit.    Review of Systems  Constitutional: Positive for malaise/fatigue. Negative for chills and fever.  HENT: Negative for congestion, ear pain, sinus pain and sore throat.   Eyes: Negative for blurred vision, discharge and redness.  Respiratory: Positive for cough. Negative for sputum production, shortness of breath, wheezing and stridor.   Cardiovascular: Negative for chest pain, palpitations and leg swelling.  Gastrointestinal: Negative for abdominal pain, diarrhea, nausea and vomiting.  Musculoskeletal: Positive for myalgias.  Skin: Negative for rash.  Neurological: Positive for headaches. Negative for dizziness.       Was lightheaded briefly      Observations/Objective: Patient appears well, in no distress (does appear fatigued)  Weight is baseline  No facial swelling or asymmetry Normal voice-not hoarse and no slurred speech No obvious tremor or mobility impairment Moving neck and UEs normally Able to hear the call well  occ dry cough during interview w/o wheezing or sob  Talkative and mentally sharp with no cognitive changes No skin changes on face or neck , no rash or pallor Affect is normal    Assessment and Plan: Problem List Items Addressed This Visit      Other   Dry cough     With some malaise and fatigue  Some body aches but not fever Has had family in and out of house w/o masks  Enc fluids/rest Will go get tested now at the [facility] site  inst to isolate until result -voiced understanding sympt care- tylenol, otc cough suppressant  Disc s/s of covid to watch for  Watch temp Update if not starting to improve in a week or if worsening  If severe-go to ER/esp if sob Will alert with result and plan      Relevant Orders   Novel Coronavirus, NAA ([facility])  Follow Up Instructions: Drink fluids and rest  Go get tested for covid at the [facility] site Isolate yourself until you get a result  Treat symptoms - expectorant plus DM or delsym will help cough Tylenol for pain or fever  Alert [place] if worse or new symptoms If suddenly severe-go to the ER   I discussed the assessment and treatment plan with the patient. The patient was provided an opportunity to ask questions and all were answered. The patient agreed with the plan and demonstrated an understanding of the instructions.   The patient was advised to call back or seek an in-person evaluation if the symptoms worsen or if the condition fails to improve as anticipated.     Loura Pardon, MD

## 2019-06-14 NOTE — Assessment & Plan Note (Signed)
With some malaise and fatigue  Some body aches but not fever Has had family in and out of house w/o masks  Enc fluids/rest Will go get tested now at the [facility] site  inst to isolate until result -voiced understanding sympt care- tylenol, otc cough suppressant  Disc s/s of covid to watch for  Watch temp Update if not starting to improve in a week or if worsening  If severe-go to ER/esp if sob Will alert with result and plan

## 2019-06-15 LAB — NOVEL CORONAVIRUS, NAA: SARS-CoV-2, NAA: NOT DETECTED

## 2019-06-18 ENCOUNTER — Ambulatory Visit (INDEPENDENT_AMBULATORY_CARE_PROVIDER_SITE_OTHER): Payer: Medicare Other

## 2019-06-18 DIAGNOSIS — Z23 Encounter for immunization: Secondary | ICD-10-CM | POA: Diagnosis not present

## 2019-07-10 ENCOUNTER — Ambulatory Visit (INDEPENDENT_AMBULATORY_CARE_PROVIDER_SITE_OTHER): Payer: Medicare Other | Admitting: Family Medicine

## 2019-07-10 ENCOUNTER — Other Ambulatory Visit: Payer: Self-pay

## 2019-07-10 ENCOUNTER — Encounter: Payer: Self-pay | Admitting: Family Medicine

## 2019-07-10 VITALS — BP 126/74 | HR 73 | Temp 98.0°F | Ht 63.5 in | Wt 165.5 lb

## 2019-07-10 DIAGNOSIS — Z0000 Encounter for general adult medical examination without abnormal findings: Secondary | ICD-10-CM | POA: Insufficient documentation

## 2019-07-10 DIAGNOSIS — K219 Gastro-esophageal reflux disease without esophagitis: Secondary | ICD-10-CM | POA: Diagnosis not present

## 2019-07-10 DIAGNOSIS — Z01419 Encounter for gynecological examination (general) (routine) without abnormal findings: Secondary | ICD-10-CM

## 2019-07-10 DIAGNOSIS — N959 Unspecified menopausal and perimenopausal disorder: Secondary | ICD-10-CM

## 2019-07-10 DIAGNOSIS — R7303 Prediabetes: Secondary | ICD-10-CM | POA: Diagnosis not present

## 2019-07-10 MED ORDER — ESTROGENS, CONJUGATED 0.625 MG/GM VA CREA: TOPICAL_CREAM | VAGINAL | 3 refills | Status: DC

## 2019-07-10 MED ORDER — PANTOPRAZOLE SODIUM 40 MG PO TBEC: 40.0000 mg | DELAYED_RELEASE_TABLET | Freq: Two times a day (BID) | ORAL | 3 refills | Status: DC

## 2019-07-10 NOTE — Assessment & Plan Note (Signed)
Controlled with protonix  Pt also supplements B12

## 2019-07-10 NOTE — Assessment & Plan Note (Signed)
Reviewed health habits including diet and exercise and skin cancer prevention Reviewed appropriate screening tests for age  Also reviewed health mt list, fam hx and immunization status , as well as social and family history   See HPI Labs reviewed  Disc shigrix vaccine -plans to look into coverage  No falls or fractures , taking ca and D Enc good exercise  Advance directive is up to date  No cognitive concerns Nl hearing screen  No recent vision c/o (watching a cataract) Disc healthy diet for cholesterol and DM prevention

## 2019-07-10 NOTE — Patient Instructions (Addendum)
If you are interested in the new shingles vaccine (Shingrix) - call your local pharmacy to check on coverage and availability  If affordable, get on a wait list at your pharmacy to get the vaccine.  For diabetes prevention  Try to get most of your carbohydrates from produce (with the exception of white potatoes)  Eat less bread/pasta/rice/snack foods/cereals/sweets and other items from the middle of the grocery store (processed carbs)   Take care of yourself Stay active

## 2019-07-10 NOTE — Assessment & Plan Note (Signed)
Lab Results  Component Value Date   HGBA1C 6.4 06/08/2019   disc imp of low glycemic diet and wt loss to prevent DM2  Pt has DM in family

## 2019-07-10 NOTE — Progress Notes (Signed)
Subjective:    Patient ID: [name], female    DOB: 03-27-1951, 68 y.o.   MRN: [id number]  HPI Here for amw and health mt exam with rev of chronic medical problems  I have personally reviewed the Medicare Annual Wellness questionnaire and have noted 1. The patient's medical and social history 2. Their use of alcohol, tobacco or illicit drugs 3. Their current medications and supplements 4. The patient's functional ability including ADL's, fall risks, home safety risks and hearing or visual             impairment. 5. Diet and physical activities 6. Evidence for depression or mood disorders  The patients weight, height, BMI have been recorded in the chart and visual acuity is per eye clinic.  I have made referrals, counseling and provided education to the patient based review of the above and I have provided the pt with a written personalized care plan for preventive services. Reviewed and updated provider list, see scanned forms.  See scanned forms.  Routine anticipatory guidance given to patient.  See health maintenance. Colon cancer screening 12/14 colonoscopy  Breast cancer screening 1/20 mammogram She had a bilateral mastectomy subcut) with implants  for severe macrocystic breast -mammogram is for residual tissue Self breast exam- no changes  Sister had breast cancer in 60s  Flu vaccine 10/20 Tetanus vaccine 8/20 Td Pneumovax completed Zoster vaccine  1/14 zostavax , and had shingles in may (she thinks) Dexa 10/19 -normal bone density Falls-none recent   (has tripped in the past)  [employer] - taking ca and D  Exercise - uses a fitness watch  10,000 to 20,000 steps per day  Was walking 2 mi daily-then developed foot problems -heel and toe  Sees ortho for this tomorrow   Advance directive-has a living will and poa - may update soon Cognitive function addressed- see scanned forms- and if abnormal then additional documentation follows.  No problems   Handles all her affairs/ math/finances   PMH and SH reviewed  Meds, vitals, and allergies reviewed.   ROS: See HPI.  Otherwise negative.    Weight : Wt Readings from Last 3 Encounters:  07/10/19 165 lb 8 oz (75.1 kg)  04/27/19 162 lb 4 oz (73.6 kg)  04/06/19 162 lb 9 oz (73.7 kg)  good  Exercise Eats healthy - occ cheats (harder lately to control that)  28.86 kg/m   Hearing/vision:  Hearing Screening   125Hz  250Hz  500Hz  1000Hz  2000Hz  3000Hz  4000Hz  6000Hz  8000Hz   Right ear:   40 40 40  40    Left ear:   40 40 40  40    Vision Screening Comments: Pt had eye exam in April 2020 with Dr. Delman Cheadle good hearing screen occ has to have folks repeat things  No vision problems- has a tiny cataract   BP Readings from Last 3 Encounters:  07/10/19 126/74  06/14/19 130/89  04/27/19 132/78   Pulse Readings from Last 3 Encounters:  07/10/19 73  06/14/19 80  04/27/19 80   Prediabetes Lab Results  Component Value Date   HGBA1C 6.4 06/08/2019  has some diabetes in family  Sugar intake goes in waves   Cholesterol  Lab Results  Component Value Date   CHOL 191 06/08/2019   CHOL 188 06/06/2018   CHOL 198 05/26/2017   Lab Results  Component Value Date   HDL 62.10 06/08/2019   HDL 65.10 06/06/2018   HDL 66.00 05/26/2017   Lab Results  Component Value Date   LDLCALC 92 06/08/2019   LDLCALC 105 (H) 05/26/2017   LDLCALC 102 (H) 05/05/2016   Lab Results  Component Value Date   TRIG 180.0 (H) 06/08/2019   TRIG 205.0 (H) 06/06/2018   TRIG 137.0 05/26/2017   Lab Results  Component Value Date   CHOLHDL 3 06/08/2019   CHOLHDL 3 06/06/2018   CHOLHDL 3 05/26/2017   Lab Results  Component Value Date   LDLDIRECT 97.0 06/06/2018   LDLDIRECT 119.0 05/24/2013   LDLDIRECT 117.3 02/24/2012   Stable LDL in the 90s  Trig are down a bit   Lab Results  Component Value Date   CREATININE 0.65 06/08/2019   BUN 20 06/08/2019   NA 139 06/08/2019   K 4.2 06/08/2019   CL 103  06/08/2019   CO2 29 06/08/2019   Lab Results  Component Value Date   ALT 21 06/08/2019   AST 16 06/08/2019   ALKPHOS 51 06/08/2019   BILITOT 1.1 06/08/2019    Lab Results  Component Value Date   WBC 6.1 06/08/2019   HGB 13.4 06/08/2019   HCT 41.0 06/08/2019   MCV 88.5 06/08/2019   PLT 293.0 06/08/2019   Lab Results  Component Value Date   TSH 3.02 06/08/2019    Patient Active Problem List   Diagnosis Date Noted  . Medicare annual wellness visit, subsequent 07/10/2019  . Fatigue 04/06/2019  . Pressure in head 04/06/2019  . Paresthesia 04/06/2019  . Foot pain, bilateral 04/06/2019  . Vaginal atrophy 06/09/2018  . Thoracic back pain 09/27/2017  . Breast cancer screening 05/31/2017  . Prediabetes 06/02/2016  . Stress reaction 05/10/2016  . Estrogen deficiency 01/29/2015  . Routine general medical examination at a health care facility 02/20/2012  . GERD 06/20/2008   Past Medical History:  Diagnosis Date  . Depression   . Fatty liver   . Fibrocystic disease of both breasts   . GERD (gastroesophageal reflux disease)   . Hyperglycemia   . 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 Sphincter of Oddi dysfunction    Past Surgical History:  Procedure Laterality Date  . ABDOMINAL HYSTERECTOMY    . CARPAL TUNNEL RELEASE  08/2008   left  . ERCP    . LEFT HEART CATHETERIZATION WITH CORONARY ANGIOGRAM N/A 06/08/2013   Procedure: LEFT HEART CATHETERIZATION WITH CORONARY ANGIOGRAM;  Surgeon: Ramond Dial, MD;  Location: [facility] CATH LAB;  Service: Cardiovascular;  Laterality: N/A;  . MASTECTOMY     bilateral for severe macrocystic breast   Social History   Tobacco Use  . Smoking status: Never Smoker  . Smokeless tobacco: Never Used  Substance Use Topics  . Alcohol use: Yes    Alcohol/week: 0.0 standard drinks    Comment: occ  . Drug use: No   Family History  Problem Relation Age of Onset  . Hypertension Father    . Cancer Father        prostate and leukemia  . Cancer Maternal Aunt        Pancreatic  . Diabetes Maternal Uncle   . Heart disease Maternal Grandmother   . Brain cancer Mother        hospice  . Breast cancer Sister 29  . Cancer Sister    Allergies  Allergen Reactions  . Atorvastatin     REACTION: rash  . Ezetimibe     REACTION: muscle aches  . Sulfamethoxazole-Trimethoprim Other (See  Comments)    Swelling of nostrils   Current Outpatient Medications on File Prior to Visit  Medication Sig Dispense Refill  . acetaminophen (TYLENOL) 500 MG tablet Take 500 mg by mouth at bedtime.     . ALPRAZolam (XANAX) 1 MG tablet TAKE 0.5 TABLETS (0.5 MG TOTAL) BY MOUTH AT BEDTIME AS NEEDED FOR ANXIETY OR SLEEP. 15 tablet 3  . ASPIRIN PO Take 81 mg by mouth daily.    . Calcium-Magnesium-Vitamin D (CALCIUM 1200+D3 PO) Take 1 tablet by mouth at bedtime.    . cyanocobalamin 100 MCG tablet Take 100 mcg by mouth daily.    . diclofenac sodium (VOLTAREN) 1 % GEL Apply 4 g topically 4 (four) times daily as needed. 3 Tube 3  . MELATONIN PO Take 10 mg by mouth daily.    . Multiple Vitamins-Minerals (EYE VITAMINS PO) Take 1 capsule by mouth daily.    . Multiple Vitamins-Minerals (MULTIVITAMIN GUMMIES ADULT PO) Take by mouth daily.    . Omega-3 Fatty Acids (OMEGA-3 FISH OIL PO) Take 1,000 mg by mouth daily.    . Probiotic Product (PROBIOTIC PO) Take 1 capsule by mouth daily.     No current facility-administered medications on file prior to visit.     Review of Systems  Constitutional: Negative for activity change, appetite change, fatigue, fever and unexpected weight change.  HENT: Negative for congestion, ear pain, rhinorrhea, sinus pressure and sore throat.   Eyes: Negative for pain, redness and visual disturbance.  Respiratory: Negative for cough, shortness of breath and wheezing.   Cardiovascular: Negative for chest pain and palpitations.  Gastrointestinal: Negative for abdominal pain, blood in  stool, constipation and diarrhea.  Endocrine: Negative for polydipsia and polyuria.  Genitourinary: Negative for dysuria, frequency and urgency.  Musculoskeletal: Positive for arthralgias. Negative for back pain and myalgias.       Bilat foot pain  Skin: Negative for pallor and rash.  Allergic/Immunologic: Negative for environmental allergies.  Neurological: Negative for dizziness, syncope and headaches.  Hematological: Negative for adenopathy. Does not bruise/bleed easily.  Psychiatric/Behavioral: Negative for decreased concentration and dysphoric mood. The patient is not nervous/anxious.        Objective:   Physical Exam Constitutional:      General: She is not in acute distress.    Appearance: Normal appearance. She is well-developed. She is not ill-appearing or diaphoretic.  HENT:     Head: Normocephalic and atraumatic.     Right Ear: Tympanic membrane, ear canal and external ear normal.     Left Ear: Tympanic membrane, ear canal and external ear normal.     Nose: Nose normal. No congestion.     Mouth/Throat:     Mouth: Mucous membranes are moist.     Pharynx: Oropharynx is clear. No posterior oropharyngeal erythema.  Eyes:     General: No scleral icterus.    Extraocular Movements: Extraocular movements intact.     Conjunctiva/sclera: Conjunctivae normal.     Pupils: Pupils are equal, round, and reactive to light.  Neck:     Musculoskeletal: Normal range of motion and neck supple. No neck rigidity or muscular tenderness.     Thyroid: No thyromegaly.     Vascular: No carotid bruit or JVD.  Cardiovascular:     Rate and Rhythm: Normal rate and regular rhythm.     Pulses: Normal pulses.     Heart sounds: Normal heart sounds. No gallop.   Pulmonary:     Effort: Pulmonary effort is normal. No respiratory  distress.     Breath sounds: Normal breath sounds. No wheezing.     Comments: Good air exch Chest:     Chest wall: No tenderness.  Abdominal:     General: Bowel sounds are  normal. There is no distension or abdominal bruit.     Palpations: Abdomen is soft. There is no mass.     Tenderness: There is no abdominal tenderness.     Hernia: No hernia is present.  Genitourinary:    Comments: Breast exam:(baseline surgical changes with implants and little breast tissue): No mass, nodules, thickening, tenderness, bulging, retraction, inflamation, nipple discharge or skin changes noted.  No axillary or clavicular LA.     Musculoskeletal: Normal range of motion.        General: No tenderness.     Right lower leg: No edema.     Left lower leg: No edema.  Lymphadenopathy:     Cervical: No cervical adenopathy.  Skin:    General: Skin is warm and dry.     Coloration: Skin is not pale.     Findings: No erythema or rash.     Comments: Solar lentigines diffusely Some sks   Neurological:     Mental Status: She is alert. Mental status is at baseline.     Cranial Nerves: No cranial nerve deficit.     Motor: No abnormal muscle tone.     Coordination: Coordination normal.     Gait: Gait normal.     Deep Tendon Reflexes: Reflexes are normal and symmetric. Reflexes normal.  Psychiatric:        Mood and Affect: Mood normal.        Cognition and Memory: Cognition and memory normal.           Assessment & Plan:   Problem List Items Addressed This Visit      Digestive   GERD    Controlled with protonix  Pt also supplements B12      Relevant Medications   pantoprazole (PROTONIX) 40 MG tablet     Other   Routine general medical examination at a health care facility    Reviewed health habits including diet and exercise and skin cancer prevention Reviewed appropriate screening tests for age  Also reviewed health mt list, fam hx and immunization status , as well as social and family history   See HPI Labs reviewed  Disc shigrix vaccine -plans to look into coverage  No falls or fractures , taking ca and D Enc good exercise  Advance directive is up to date  No  cognitive concerns Nl hearing screen  No recent vision c/o (watching a cataract) Disc healthy diet for cholesterol and DM prevention       Prediabetes    Lab Results  Component Value Date   HGBA1C 6.4 06/08/2019   disc imp of low glycemic diet and wt loss to prevent DM2  Pt has DM in family      Medicare annual wellness visit, subsequent - Primary    Reviewed health habits including diet and exercise and skin cancer prevention Reviewed appropriate screening tests for age  Also reviewed health mt list, fam hx and immunization status , as well as social and family history   See HPI Labs reviewed  Disc shigrix vaccine -plans to look into coverage  No falls or fractures , taking ca and D Enc good exercise  Advance directive is up to date  No cognitive concerns Nl hearing screen  No recent vision  c/o (watching a cataract) Disc healthy diet for cholesterol and DM prevention        Other Visit Diagnoses    Well woman exam with routine gynecological exam       Relevant Medications   conjugated estrogens (PREMARIN) vaginal cream   Menopausal disorder       Relevant Medications   conjugated estrogens (PREMARIN) vaginal cream

## 2019-07-11 DIAGNOSIS — M79672 Pain in left foot: Secondary | ICD-10-CM | POA: Diagnosis not present

## 2019-07-11 DIAGNOSIS — M79671 Pain in right foot: Secondary | ICD-10-CM | POA: Diagnosis not present

## 2019-08-02 ENCOUNTER — Other Ambulatory Visit: Payer: Self-pay | Admitting: Family Medicine

## 2019-08-03 NOTE — Telephone Encounter (Signed)
Name of Medication:xanax Name of Pharmacy:[facility] or Written Date and Quantity:04/02/19 #15 tabs with 3 refills Last Office Visit and Type:CPE on 07/10/19 Next Office Visit and Type:none scheduled  Last Controlled Substance Agreement Date:none on file Last [id number] on file

## 2019-08-08 DIAGNOSIS — M79671 Pain in right foot: Secondary | ICD-10-CM | POA: Diagnosis not present

## 2019-08-08 DIAGNOSIS — M79672 Pain in left foot: Secondary | ICD-10-CM | POA: Diagnosis not present

## 2019-09-17 DIAGNOSIS — Z1231 Encounter for screening mammogram for malignant neoplasm of breast: Secondary | ICD-10-CM | POA: Diagnosis not present

## 2019-09-25 ENCOUNTER — Ambulatory Visit: Payer: Medicare Other | Attending: Internal Medicine

## 2019-09-25 DIAGNOSIS — Z20822 Contact with and (suspected) exposure to covid-19: Secondary | ICD-10-CM

## 2019-09-26 ENCOUNTER — Ambulatory Visit: Payer: Medicare Other

## 2019-09-26 LAB — NOVEL CORONAVIRUS, NAA: SARS-CoV-2, NAA: NOT DETECTED

## 2019-10-05 ENCOUNTER — Ambulatory Visit: Payer: Medicare Other | Attending: Internal Medicine

## 2019-10-05 DIAGNOSIS — Z23 Encounter for immunization: Secondary | ICD-10-CM | POA: Insufficient documentation

## 2019-10-05 NOTE — Progress Notes (Signed)
   Covid-19 Vaccination Clinic  Name:  [name]    MRN: [id number] DOB: 1951/07/01  10/05/2019  [name] was observed post Covid-19 immunization for 15 minutes without incidence. She was provided with Vaccine Information Sheet and instruction to access the V-Safe system.   [name] was instructed to call 911 with any severe reactions post vaccine: Marland Kitchen Difficulty breathing  . Swelling of your face and throat  . A fast heartbeat  . A bad rash all over your body  . Dizziness and weakness    Immunizations Administered    Name Date Dose VIS Date Route   Pfizer COVID-19 Vaccine 10/05/2019 12:01 PM 0.3 mL 08/10/2019 Intramuscular   Manufacturer: [place]   Lot: [id number]   [place]: [id number]

## 2019-10-07 ENCOUNTER — Ambulatory Visit: Payer: Medicare Other

## 2019-10-15 DIAGNOSIS — M542 Cervicalgia: Secondary | ICD-10-CM | POA: Diagnosis not present

## 2019-10-15 DIAGNOSIS — M79671 Pain in right foot: Secondary | ICD-10-CM | POA: Diagnosis not present

## 2019-10-19 DIAGNOSIS — M5412 Radiculopathy, cervical region: Secondary | ICD-10-CM | POA: Diagnosis not present

## 2019-10-22 DIAGNOSIS — M5412 Radiculopathy, cervical region: Secondary | ICD-10-CM | POA: Diagnosis not present

## 2019-10-24 DIAGNOSIS — M5412 Radiculopathy, cervical region: Secondary | ICD-10-CM | POA: Diagnosis not present

## 2019-10-30 ENCOUNTER — Ambulatory Visit: Payer: Medicare Other | Attending: Internal Medicine

## 2019-10-30 DIAGNOSIS — Z23 Encounter for immunization: Secondary | ICD-10-CM | POA: Insufficient documentation

## 2019-10-30 NOTE — Progress Notes (Signed)
   Covid-19 Vaccination Clinic  Name:  [name]    MRN: [id number] DOB: 01-02-1951  10/30/2019  [name] was observed post Covid-19 immunization for 15 minutes without incident. She was provided with Vaccine Information Sheet and instruction to access the V-Safe system.   [name] was instructed to call 911 with any severe reactions post vaccine: Marland Kitchen Difficulty breathing  . Swelling of face and throat  . A fast heartbeat  . A bad rash all over body  . Dizziness and weakness   Immunizations Administered    Name Date Dose VIS Date Route   Pfizer COVID-19 Vaccine 10/30/2019 12:23 PM 0.3 mL 08/10/2019 Intramuscular   Manufacturer: [place]   Lot: [id number]   [place]: [id number]

## 2019-11-01 DIAGNOSIS — M5412 Radiculopathy, cervical region: Secondary | ICD-10-CM | POA: Diagnosis not present

## 2019-11-05 DIAGNOSIS — M5412 Radiculopathy, cervical region: Secondary | ICD-10-CM | POA: Diagnosis not present

## 2019-11-05 DIAGNOSIS — M542 Cervicalgia: Secondary | ICD-10-CM | POA: Diagnosis not present

## 2019-11-05 DIAGNOSIS — M545 Low back pain: Secondary | ICD-10-CM | POA: Diagnosis not present

## 2019-11-15 DIAGNOSIS — M542 Cervicalgia: Secondary | ICD-10-CM | POA: Diagnosis not present

## 2019-11-15 DIAGNOSIS — M545 Low back pain: Secondary | ICD-10-CM | POA: Diagnosis not present

## 2019-11-19 DIAGNOSIS — M5412 Radiculopathy, cervical region: Secondary | ICD-10-CM | POA: Diagnosis not present

## 2019-11-19 DIAGNOSIS — M5416 Radiculopathy, lumbar region: Secondary | ICD-10-CM | POA: Diagnosis not present

## 2019-11-24 ENCOUNTER — Other Ambulatory Visit: Payer: Self-pay | Admitting: Family Medicine

## 2019-11-26 NOTE — Telephone Encounter (Signed)
??   If to early  Name of Medication:xanax Name of Pharmacy:[facility] or Written Date and Quantity:08/03/19#15 tabs with 3 refills Last Office Visit and Type:CPE on 07/10/19 Next Office Visit and Type:CPE on 07/11/20  Last Controlled Substance Agreement Date:none on file Last [id number] on file

## 2019-11-26 NOTE — Telephone Encounter (Signed)
It looks too early

## 2019-11-29 ENCOUNTER — Telehealth: Payer: Self-pay

## 2019-11-29 ENCOUNTER — Other Ambulatory Visit: Payer: Self-pay | Admitting: Family Medicine

## 2019-11-29 NOTE — Telephone Encounter (Signed)
1. Pt has been on prednisone twice in the last 3 months as well as 2 steroid injections in her foot. Insurance Nurse came out 2 weeks ago and her A1C was 6.7   2. She is going to neurosurgery due to the results of her MRI.  Also, asked about alprazolam refill. See other message about that.

## 2019-11-29 NOTE — Telephone Encounter (Signed)
Name of Medication:xanax Name of Pharmacy:[facility] or Written Date and Quantity:08/03/19#15 tabs with 3 refills Last Office Visit and Type:CPE on 07/10/19 Next Office Visit and Type: CPE on 07/11/20  Last Controlled Substance Agreement Date:none on file Last [id number] on file

## 2019-11-29 NOTE — Telephone Encounter (Signed)
The steroids would make her glucose higher so we should re check in 3 months.  If no further steroids I expect it will come back down.   Thanks for letting me know

## 2019-11-29 NOTE — Telephone Encounter (Signed)
Pt called about another issue and asked about her refill. She said she has enough to last until this Sunday 4/4.

## 2019-12-03 NOTE — Telephone Encounter (Signed)
Pt notified of Dr. Tower's comments  

## 2019-12-12 DIAGNOSIS — R030 Elevated blood-pressure reading, without diagnosis of hypertension: Secondary | ICD-10-CM | POA: Diagnosis not present

## 2019-12-12 DIAGNOSIS — M5412 Radiculopathy, cervical region: Secondary | ICD-10-CM | POA: Diagnosis not present

## 2019-12-12 DIAGNOSIS — M5020 Other cervical disc displacement, unspecified cervical region: Secondary | ICD-10-CM | POA: Diagnosis not present

## 2019-12-26 DIAGNOSIS — M5412 Radiculopathy, cervical region: Secondary | ICD-10-CM | POA: Diagnosis not present

## 2020-02-12 DIAGNOSIS — M50021 Cervical disc disorder at C4-C5 level with myelopathy: Secondary | ICD-10-CM | POA: Diagnosis not present

## 2020-02-12 DIAGNOSIS — M50121 Cervical disc disorder at C4-C5 level with radiculopathy: Secondary | ICD-10-CM | POA: Diagnosis not present

## 2020-02-12 DIAGNOSIS — M5020 Other cervical disc displacement, unspecified cervical region: Secondary | ICD-10-CM | POA: Diagnosis not present

## 2020-02-12 DIAGNOSIS — M4802 Spinal stenosis, cervical region: Secondary | ICD-10-CM | POA: Diagnosis not present

## 2020-02-12 DIAGNOSIS — M4712 Other spondylosis with myelopathy, cervical region: Secondary | ICD-10-CM | POA: Diagnosis not present

## 2020-02-12 HISTORY — PX: SPINE SURGERY: SHX786

## 2020-03-05 DIAGNOSIS — M5412 Radiculopathy, cervical region: Secondary | ICD-10-CM | POA: Diagnosis not present

## 2020-03-25 ENCOUNTER — Other Ambulatory Visit: Payer: Self-pay | Admitting: Family Medicine

## 2020-03-26 NOTE — Telephone Encounter (Signed)
Name of Medication:xanax Name of Pharmacy:[facility] or Written Date and Quantity:11/29/19#15 tabs with 3 refills Last Office Visit and Type:CPE on 07/10/19 Next Office Visit and Type: CPE on 07/11/20 Last Controlled Substance Agreement Date:none on file Last [id number] on file

## 2020-03-28 ENCOUNTER — Ambulatory Visit: Payer: Medicare Other | Attending: Internal Medicine

## 2020-03-28 ENCOUNTER — Other Ambulatory Visit: Payer: Self-pay | Admitting: *Deleted

## 2020-03-28 DIAGNOSIS — Z20822 Contact with and (suspected) exposure to covid-19: Secondary | ICD-10-CM | POA: Diagnosis not present

## 2020-03-29 LAB — SARS-COV-2, NAA 2 DAY TAT

## 2020-03-29 LAB — NOVEL CORONAVIRUS, NAA: SARS-CoV-2, NAA: NOT DETECTED

## 2020-04-16 DIAGNOSIS — M5412 Radiculopathy, cervical region: Secondary | ICD-10-CM | POA: Diagnosis not present

## 2020-06-05 ENCOUNTER — Other Ambulatory Visit: Payer: Self-pay

## 2020-06-05 ENCOUNTER — Ambulatory Visit: Payer: Medicare Other | Attending: Internal Medicine

## 2020-06-05 DIAGNOSIS — Z23 Encounter for immunization: Secondary | ICD-10-CM

## 2020-06-05 NOTE — Progress Notes (Signed)
   Covid-19 Vaccination Clinic  Name:  [name]    MRN: [id number] DOB: [birth year]  06/05/2020  [name] was observed post Covid-19 immunization for 30 minutes based on pre-vaccination screening without incident. She was provided with Vaccine Information Sheet and instruction to access the V-Safe system.   [name] was instructed to call 911 with any severe reactions post vaccine: Marland Kitchen Difficulty breathing  . Swelling of face and throat  . A fast heartbeat  . A bad rash all over body  . Dizziness and weakness

## 2020-06-12 ENCOUNTER — Other Ambulatory Visit: Payer: Self-pay

## 2020-06-12 ENCOUNTER — Ambulatory Visit (INDEPENDENT_AMBULATORY_CARE_PROVIDER_SITE_OTHER): Payer: Medicare Other

## 2020-06-12 DIAGNOSIS — Z23 Encounter for immunization: Secondary | ICD-10-CM

## 2020-06-14 ENCOUNTER — Other Ambulatory Visit: Payer: Self-pay | Admitting: Family Medicine

## 2020-06-30 DIAGNOSIS — M25512 Pain in left shoulder: Secondary | ICD-10-CM | POA: Diagnosis not present

## 2020-06-30 DIAGNOSIS — G8929 Other chronic pain: Secondary | ICD-10-CM | POA: Diagnosis not present

## 2020-06-30 DIAGNOSIS — M542 Cervicalgia: Secondary | ICD-10-CM | POA: Diagnosis not present

## 2020-06-30 DIAGNOSIS — R030 Elevated blood-pressure reading, without diagnosis of hypertension: Secondary | ICD-10-CM | POA: Diagnosis not present

## 2020-06-30 DIAGNOSIS — M5412 Radiculopathy, cervical region: Secondary | ICD-10-CM | POA: Diagnosis not present

## 2020-07-03 ENCOUNTER — Telehealth: Payer: Self-pay | Admitting: Family Medicine

## 2020-07-03 DIAGNOSIS — Z0000 Encounter for general adult medical examination without abnormal findings: Secondary | ICD-10-CM

## 2020-07-03 DIAGNOSIS — R7303 Prediabetes: Secondary | ICD-10-CM

## 2020-07-03 NOTE — Telephone Encounter (Signed)
-----   Message from Cloyd Stagers, RT sent at 06/19/2020  2:51 PM EDT ----- Regarding: Lab Orders for Friday 11.5.2021 Please place lab orders for Friday 11.5.2021, office visit for physical on Friday 11.12.2021 Thank you, Dyke Maes RT(R)

## 2020-07-04 ENCOUNTER — Other Ambulatory Visit (INDEPENDENT_AMBULATORY_CARE_PROVIDER_SITE_OTHER): Payer: Medicare Other

## 2020-07-04 ENCOUNTER — Other Ambulatory Visit: Payer: Self-pay

## 2020-07-04 ENCOUNTER — Ambulatory Visit (INDEPENDENT_AMBULATORY_CARE_PROVIDER_SITE_OTHER): Payer: Medicare Other

## 2020-07-04 DIAGNOSIS — R7303 Prediabetes: Secondary | ICD-10-CM

## 2020-07-04 DIAGNOSIS — Z0000 Encounter for general adult medical examination without abnormal findings: Secondary | ICD-10-CM

## 2020-07-04 LAB — COMPREHENSIVE METABOLIC PANEL
ALT: 19 U/L (ref 0–35)
AST: 16 U/L (ref 0–37)
Albumin: 4.6 g/dL (ref 3.5–5.2)
Alkaline Phosphatase: 61 U/L (ref 39–117)
BUN: 22 mg/dL (ref 6–23)
CO2: 28 mEq/L (ref 19–32)
Calcium: 9.8 mg/dL (ref 8.4–10.5)
Chloride: 102 mEq/L (ref 96–112)
Creatinine, Ser: 0.62 mg/dL (ref 0.40–1.20)
GFR: 89.98 mL/min (ref >60.00)
Glucose, Bld: 100 mg/dL — ABNORMAL HIGH (ref 70–99)
Potassium: 4.5 mEq/L (ref 3.5–5.1)
Sodium: 139 mEq/L (ref 135–145)
Total Bilirubin: 0.8 mg/dL (ref 0.2–1.2)
Total Protein: 7.1 g/dL (ref 6.0–8.3)

## 2020-07-04 LAB — CBC WITH DIFFERENTIAL/PLATELET
Basophils Absolute: 0 10*3/uL (ref 0.0–0.1)
Basophils Relative: 0.4 % (ref 0.0–3.0)
Eosinophils Absolute: 0.2 10*3/uL (ref 0.0–0.7)
Eosinophils Relative: 3.6 % (ref 0.0–5.0)
HCT: 40.7 % (ref 36.0–46.0)
Hemoglobin: 13.2 g/dL (ref 12.0–15.0)
Lymphocytes Relative: 45.9 % (ref 12.0–46.0)
Lymphs Abs: 2.9 10*3/uL (ref 0.7–4.0)
MCHC: 32.4 g/dL (ref 30.0–36.0)
MCV: 87 fl (ref 78.0–100.0)
Monocytes Absolute: 0.6 10*3/uL (ref 0.1–1.0)
Monocytes Relative: 9.3 % (ref 3.0–12.0)
Neutro Abs: 2.6 10*3/uL (ref 1.4–7.7)
Neutrophils Relative %: 40.8 % — ABNORMAL LOW (ref 43.0–77.0)
Platelets: 281 10*3/uL (ref 150.0–400.0)
RBC: 4.67 Mil/uL (ref 3.87–5.11)
RDW: 14 % (ref 11.5–15.5)
WBC: 6.3 10*3/uL (ref 4.0–10.5)

## 2020-07-04 LAB — LIPID PANEL
Cholesterol: 209 mg/dL — ABNORMAL HIGH (ref 0–200)
HDL: 69.3 mg/dL (ref >39.00)
LDL Cholesterol: 107 mg/dL — ABNORMAL HIGH (ref 0–99)
NonHDL: 139.28
Total CHOL/HDL Ratio: 3
Triglycerides: 163 mg/dL — ABNORMAL HIGH (ref 0.0–149.0)
VLDL: 32.6 mg/dL (ref 0.0–40.0)

## 2020-07-04 LAB — TSH: TSH: 4.93 u[IU]/mL — ABNORMAL HIGH (ref 0.35–4.50)

## 2020-07-04 LAB — HEMOGLOBIN A1C: Hgb A1c MFr Bld: 6.5 % (ref 4.6–6.5)

## 2020-07-04 NOTE — Patient Instructions (Signed)
[name] , Thank you for taking time to come for your Medicare Wellness Visit. I appreciate your ongoing commitment to your health goals. Please review the following plan we discussed and let me know if I can assist you in the future.   Screening recommendations/referrals: Colonoscopy: Up to date, completed 08/10/2013, due 07/2023 Mammogram: Up to date, completed 09/07/2019, due 08/2020 Bone Density: due, will discuss with provider at upcoming visit Recommended yearly ophthalmology/optometry visit for glaucoma screening and checkup Recommended yearly dental visit for hygiene and checkup  Vaccinations: Influenza vaccine: Up to date, completed 06/12/2020, due 05/2021 Pneumococcal vaccine: Completed series Tdap vaccine: Up to date, completed 04/27/2019, due 03/2029 Shingles vaccine: due, check with your pharmacy regarding insurance coverage if interested   Covid-19:Completed series  Advanced directives: Please bring a copy of your POA (Power of [place]) and/or Living Will to your next appointment.   Conditions/risks identified: Prediabetes  Next appointment: Follow up in one year for your annual wellness visit    Preventive Care 65 Years and Older, Female Preventive care refers to lifestyle choices and visits with your health care provider that can promote health and wellness. What does preventive care include?  A yearly physical exam. This is also called an annual well check.  Dental exams once or twice a year.  Routine eye exams. Ask your health care provider how often you should have your eyes checked.  Personal lifestyle choices, including:  Daily care of your teeth and gums.  Regular physical activity.  Eating a healthy diet.  Avoiding tobacco and drug use.  Limiting alcohol use.  Practicing safe sex.  Taking low-dose aspirin every day.  Taking vitamin and mineral supplements as recommended by your health care provider. What happens during an annual well check? The  services and screenings done by your health care provider during your annual well check will depend on your age, overall health, lifestyle risk factors, and family history of disease. Counseling  Your health care provider may ask you questions about your:  Alcohol use.  Tobacco use.  Drug use.  Emotional well-being.  Home and relationship well-being.  Sexual activity.  Eating habits.  History of falls.  Memory and ability to understand (cognition).  Work and work Statistician.  Reproductive health. Screening  You may have the following tests or measurements:  Height, weight, and BMI.  Blood pressure.  Lipid and cholesterol levels. These may be checked every 5 years, or more frequently if you are over 46 years old.  Skin check.  Lung cancer screening. You may have this screening every year starting at age 16 if you have a 30-pack-year history of smoking and currently smoke or have quit within the past 15 years.  Fecal occult blood test (FOBT) of the stool. You may have this test every year starting at age 62.  Flexible sigmoidoscopy or colonoscopy. You may have a sigmoidoscopy every 5 years or a colonoscopy every 10 years starting at age 30.  Hepatitis C blood test.  Hepatitis B blood test.  Sexually transmitted disease (STD) testing.  Diabetes screening. This is done by checking your blood sugar (glucose) after you have not eaten for a while (fasting). You may have this done every 1-3 years.  Bone density scan. This is done to screen for osteoporosis. You may have this done starting at age 41.  Mammogram. This may be done every 1-2 years. Talk to your health care provider about how often you should have regular mammograms. Talk with your health care  provider about your test results, treatment options, and if necessary, the need for more tests. Vaccines  Your health care provider may recommend certain vaccines, such as:  Influenza vaccine. This is recommended  every year.  Tetanus, diphtheria, and acellular pertussis (Tdap, Td) vaccine. You may need a Td booster every 10 years.  Zoster vaccine. You may need this after age 89.  Pneumococcal 13-valent conjugate (PCV13) vaccine. One dose is recommended after age 6.  Pneumococcal polysaccharide (PPSV23) vaccine. One dose is recommended after age 48. Talk to your health care provider about which screenings and vaccines you need and how often you need them. This information is not intended to replace advice given to you by your health care provider. Make sure you discuss any questions you have with your health care provider. Document Released: 09/12/2015 Document Revised: 05/05/2016 Document Reviewed: 06/17/2015 Elsevier Interactive Patient Education  2017 [place] Prevention in the Home Falls can cause injuries. They can happen to people of all ages. There are many things you can do to make your home safe and to help prevent falls. What can I do on the outside of my home?  Regularly fix the edges of walkways and driveways and fix any cracks.  Remove anything that might make you trip as you walk through a door, such as a raised step or threshold.  Trim any bushes or trees on the path to your home.  Use bright outdoor lighting.  Clear any walking paths of anything that might make someone trip, such as rocks or tools.  Regularly check to see if handrails are loose or broken. Make sure that both sides of any steps have handrails.  Any raised decks and porches should have guardrails on the edges.  Have any leaves, snow, or ice cleared regularly.  Use sand or salt on walking paths during winter.  Clean up any spills in your garage right away. This includes oil or grease spills. What can I do in the bathroom?  Use night lights.  Install grab bars by the toilet and in the tub and shower. Do not use towel bars as grab bars.  Use non-skid mats or decals in the tub or shower.  If  you need to sit down in the shower, use a plastic, non-slip stool.  Keep the floor dry. Clean up any water that spills on the floor as soon as it happens.  Remove soap buildup in the tub or shower regularly.  Attach bath mats securely with double-sided non-slip rug tape.  Do not have throw rugs and other things on the floor that can make you trip. What can I do in the bedroom?  Use night lights.  Make sure that you have a light by your bed that is easy to reach.  Do not use any sheets or blankets that are too big for your bed. They should not hang down onto the floor.  Have a firm chair that has side arms. You can use this for support while you get dressed.  Do not have throw rugs and other things on the floor that can make you trip. What can I do in the kitchen?  Clean up any spills right away.  Avoid walking on wet floors.  Keep items that you use a lot in easy-to-reach places.  If you need to reach something above you, use a strong step stool that has a grab bar.  Keep electrical cords out of the way.  Do not use floor polish  or wax that makes floors slippery. If you must use wax, use non-skid floor wax.  Do not have throw rugs and other things on the floor that can make you trip. What can I do with my stairs?  Do not leave any items on the stairs.  Make sure that there are handrails on both sides of the stairs and use them. Fix handrails that are broken or loose. Make sure that handrails are as long as the stairways.  Check any carpeting to make sure that it is firmly attached to the stairs. Fix any carpet that is loose or worn.  Avoid having throw rugs at the top or bottom of the stairs. If you do have throw rugs, attach them to the floor with carpet tape.  Make sure that you have a light switch at the top of the stairs and the bottom of the stairs. If you do not have them, ask someone to add them for you. What else can I do to help prevent falls?  Wear shoes  that:  Do not have high heels.  Have rubber bottoms.  Are comfortable and fit you well.  Are closed at the toe. Do not wear sandals.  If you use a stepladder:  Make sure that it is fully opened. Do not climb a closed stepladder.  Make sure that both sides of the stepladder are locked into place.  Ask someone to hold it for you, if possible.  Clearly mark and make sure that you can see:  Any grab bars or handrails.  First and last steps.  Where the edge of each step is.  Use tools that help you move around (mobility aids) if they are needed. These include:  Canes.  Walkers.  Scooters.  Crutches.  Turn on the lights when you go into a dark area. Replace any light bulbs as soon as they burn out.  Set up your furniture so you have a clear path. Avoid moving your furniture around.  If any of your floors are uneven, fix them.  If there are any pets around you, be aware of where they are.  Review your medicines with your doctor. Some medicines can make you feel dizzy. This can increase your chance of falling. Ask your doctor what other things that you can do to help prevent falls. This information is not intended to replace advice given to you by your health care provider. Make sure you discuss any questions you have with your health care provider. Document Released: 06/12/2009 Document Revised: 01/22/2016 Document Reviewed: 09/20/2014 Elsevier Interactive Patient Education  2017 [employer].

## 2020-07-04 NOTE — Progress Notes (Signed)
PCP notes:  Health Maintenance: Dexa- due   Abnormal Screenings: none   Patient concerns: Discuss hot flashes Elevated blood pressure readings when going to the doctor   Nurse concerns: none   Next PCP appt.: 07/11/2020 @ 10:30 am

## 2020-07-04 NOTE — Progress Notes (Signed)
Subjective:   [name] is a 69 y.o. female who presents for Medicare Annual (Subsequent) preventive examination.  Review of Systems: N/A      I connected with the patient today by telephone and verified that I am speaking with the correct person using two identifiers. Location patient: home Location nurse: work Persons participating in the telephone visit: patient, nurse.   I discussed the limitations, risks, security and privacy concerns of performing an evaluation and management service by telephone and the availability of in person appointments. I also discussed with the patient that there may be a patient responsible charge related to this service. The patient expressed understanding and verbally consented to this telephonic visit.        Cardiac Risk Factors include: advanced age (>88men, >41 women)     Objective:    Today's Vitals   There is no height or weight on file to calculate BMI.  Advanced Directives 07/04/2020 06/06/2018 05/26/2017 05/10/2016 06/08/2013  Does Patient Have a Medical Advance Directive? Yes Yes Yes Yes Patient has advance directive, copy not in chart  Type of Advance Directive [place];Living will [place];Living will [place];Living will Living will Living will  Does patient want to make changes to medical advance directive? - - - No - Patient declined -  Copy of [place] in Chart? No - copy requested No - copy requested No - copy requested No - copy requested -  Pre-existing out of facility DNR order (yellow form or pink MOST form) - - - - No    Current Medications (verified) Outpatient Encounter Medications as of 07/04/2020  Medication Sig  . acetaminophen (TYLENOL) 500 MG tablet Take 500 mg by mouth at bedtime.   . ALPRAZolam (XANAX) 1 MG tablet TAKE 1/2 TABLET BY MOUTH AT BEDTIME AS NEEDED FOR ANXIETY OR SLEEP  . ASPIRIN PO Take 81 mg by mouth daily.  .  Calcium-Magnesium-Vitamin D (CALCIUM 1200+D3 PO) Take 1 tablet by mouth at bedtime.  . conjugated estrogens (PREMARIN) vaginal cream Quarter applicator qhs 2x/week  . diclofenac sodium (VOLTAREN) 1 % GEL Apply 4 g topically 4 (four) times daily as needed.  Marland Kitchen MELATONIN PO Take 10 mg by mouth daily.  . methocarbamol (ROBAXIN) 750 MG tablet Take 750 mg by mouth 3 (three) times daily.  . Multiple Vitamins-Minerals (EYE VITAMINS PO) Take 1 capsule by mouth daily.  . Omega-3 Fatty Acids (OMEGA-3 FISH OIL PO) Take 1,000 mg by mouth daily.  . pantoprazole (PROTONIX) 40 MG tablet TAKE 1 TABLET BY MOUTH  TWICE DAILY  . traMADol (ULTRAM) 50 MG tablet   . traMADol (ULTRAM) 50 MG tablet Take 50 mg by mouth every 6 (six) hours as needed.  . cyanocobalamin 100 MCG tablet Take 100 mcg by mouth daily.  . Multiple Vitamins-Minerals (MULTIVITAMIN GUMMIES ADULT PO) Take by mouth daily.  . Probiotic Product (PROBIOTIC PO) Take 1 capsule by mouth daily.   No facility-administered encounter medications on file as of 07/04/2020.    Allergies (verified) Atorvastatin, Ezetimibe, Prednisone, and Sulfamethoxazole-trimethoprim   History: Past Medical History:  Diagnosis Date  . Depression   . Fatty liver   . Fibrocystic disease of both breasts   . GERD (gastroesophageal reflux disease)   . Hyperglycemia   . 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 Sphincter of Oddi dysfunction    Past Surgical History:  Procedure Laterality  Date  . ABDOMINAL HYSTERECTOMY    . CARPAL TUNNEL RELEASE  08/2008   left  . ERCP    . LEFT HEART CATHETERIZATION WITH CORONARY ANGIOGRAM N/A 06/08/2013   Procedure: LEFT HEART CATHETERIZATION WITH CORONARY ANGIOGRAM;  Surgeon: Ramond Dial, MD;  Location: [facility] CATH LAB;  Service: Cardiovascular;  Laterality: N/A;  . MASTECTOMY     bilateral for severe macrocystic breast  . SPINE SURGERY N/A 02/12/2020   neck   Family History    Problem Relation Age of Onset  . Hypertension Father   . Cancer Father        prostate and leukemia  . Cancer Maternal Aunt        Pancreatic  . Diabetes Maternal Uncle   . Heart disease Maternal Grandmother   . Brain cancer Mother        hospice  . Breast cancer Sister 55  . Cancer Sister    Social History   Socioeconomic History  . Marital status: Married    Spouse name: Not on file  . Number of children: Not on file  . Years of education: Not on file  . Highest education level: Not on file  Occupational History  . Not on file  Tobacco Use  . Smoking status: Never Smoker  . Smokeless tobacco: Never Used  Vaping Use  . Vaping Use: Never used  Substance and Sexual Activity  . Alcohol use: Not Currently    Alcohol/week: 0.0 standard drinks    Comment: occ  . Drug use: No  . Sexual activity: Yes    Birth control/protection: Surgical  Other Topics Concern  . Not on file  Social History Narrative  . Not on file   Social Determinants of Health   Financial Resource Strain: Low Risk   . Difficulty of Paying Living Expenses: Not hard at all  Food Insecurity: No Food Insecurity  . Worried About Charity fundraiser in the Last Year: Never true  . Ran Out of Food in the Last Year: Never true  Transportation Needs: No Transportation Needs  . Lack of Transportation (Medical): No  . Lack of Transportation (Non-Medical): No  Physical Activity: Insufficiently Active  . Days of Exercise per Week: 7 days  . Minutes of Exercise per Session: 10 min  Stress: Stress Concern Present  . Feeling of Stress : To some extent  Social Connections:   . Frequency of Communication with Friends and Family: Not on file  . Frequency of Social Gatherings with Friends and Family: Not on file  . Attends Religious Services: Not on file  . Active Member of Clubs or Organizations: Not on file  . Attends Archivist Meetings: Not on file  . Marital Status: Not on file    Tobacco  Counseling Counseling given: Not Answered   Clinical Intake:  Pre-visit preparation completed: Yes  Pain : 0-10 Pain Type: Chronic pain Pain Location: Arm Pain Orientation: Left Pain Descriptors / Indicators: Aching Pain Onset: More than a month ago Pain Frequency: Intermittent     Nutritional Risks: None Diabetes: No  How often do you need to have someone help you when you read instructions, pamphlets, or other written materials from your doctor or pharmacy?: 1 - Never What is the last grade level you completed in school?: college  Diabetic: No Nutrition Risk Assessment:  Has the patient had any N/V/D within the last 2 months?  No  Does the patient have any non-healing wounds?  No  Has the patient had any unintentional weight loss or weight gain?  No   Diabetes:  Is the patient diabetic?  No  If diabetic, was a CBG obtained today?  N/A Did the patient bring in their glucometer from home? N/A How often do you monitor your CBG's? N/A.   Financial Strains and Diabetes Management:  Are you having any financial strains with the device, your supplies or your medication? N/A.  Does the patient want to be seen by Chronic Care Management for management of their diabetes?  N/A Would the patient like to be referred to a Nutritionist or for Diabetic Management?  N/A       Information entered by :: CJohnson, LPN   Activities of Daily Living In your present state of health, do you have any difficulty performing the following activities: 07/04/2020  Hearing? N  Vision? N  Difficulty concentrating or making decisions? N  Walking or climbing stairs? N  Dressing or bathing? N  Doing errands, shopping? N  Preparing Food and eating ? N  Using the Toilet? N  In the past six months, have you accidently leaked urine? N  Do you have problems with loss of bowel control? N  Managing your Medications? N  Managing your Finances? N  Housekeeping or managing your Housekeeping? N    Some recent data might be hidden    Patient Care Team: Tower, Wynelle Fanny, MD as PCP - General Ernestina Patches Juanita Craver, MD as Consulting Physician (Obstetrics and Gynecology) Sharyne Peach, MD as Consulting Physician (Ophthalmology)  Indicate any recent Medical Services you may have received from other than [facility] providers in the past year (date may be approximate).     Assessment:   This is a routine wellness examination for [place].  Hearing/Vision screen  Hearing Screening   125Hz  250Hz  500Hz  1000Hz  2000Hz  3000Hz  4000Hz  6000Hz  8000Hz   Right ear:           Left ear:           Vision Screening Comments: Patient gets annual eye exams. Patient is due now and will call and schedule appointment.   Dietary issues and exercise activities discussed: Current Exercise Habits: Home exercise routine, Type of exercise: walking, Time (Minutes): 15, Frequency (Times/Week): 7, Weekly Exercise (Minutes/Week): 105, Intensity: Moderate, Exercise limited by: None identified  Goals    . Patient Stated     07/04/2020, I will continue to get 10,000 steps in daily.     . Weight (lb) < 141 lb (64 kg)     Target weight is 140 lbs. Starting 06/06/2018, I will attempt to monitor intake of white bread, white potatoes, and other simple carbs.       Depression Screen PHQ 2/9 Scores 07/04/2020 07/10/2019 06/06/2018 05/26/2017 05/10/2016 01/29/2015  PHQ - 2 Score 0 0 0 1 0 0  PHQ- 9 Score 0 - 5 2 - -    Fall Risk Fall Risk  07/04/2020 07/10/2019 06/06/2018 05/26/2017 05/10/2016  Falls in the past year? 0 1 No Yes No  Comment - - - pt tripped over box -  Number falls in past yr: 0 0 - 1 -  Injury with Fall? 0 0 - No -  Risk for fall due to : Medication side effect - - - -  Follow up Falls evaluation completed;Falls prevention discussed Falls evaluation completed - - -    Any stairs in or around the home? Yes  If so, are there any without handrails? No Home free of  loose throw rugs in walkways, pet beds, electrical  cords, etc? Yes  Adequate lighting in your home to reduce risk of falls? Yes   ASSISTIVE DEVICES UTILIZED TO PREVENT FALLS:  Life alert? No  Use of a cane, walker or w/c? No Grab bars in the bathroom? No  Shower chair or bench in shower? No  Elevated toilet seat or a handicapped toilet? No   TIMED UP AND GO:  Was the test performed? N/A, telephonic visit .  Cognitive Function: MMSE - Mini Mental State Exam 07/04/2020 06/06/2018 05/10/2016  Orientation to time 5 5 5   Orientation to Place 5 5 5   Registration 3 3 3   Attention/ Calculation 5 0 0  Recall 3 3 3   Language- name 2 objects - 0 0  Language- repeat 1 1 1   Language- follow 3 step command - 3 3  Language- read & follow direction - 0 0  Write a sentence - 0 0  Copy design - 0 0  Total score - 20 20  Mini Cog  Mini-Cog screen was completed. Maximum score is 22. A value of 0 denotes this part of the MMSE was not completed or the patient failed this part of the Mini-Cog screening.       Immunizations Immunization History  Administered Date(s) Administered  . Fluad Quad(high Dose 65+) 06/18/2019, 06/12/2020  . Influenza Whole 05/30/2008  . Influenza,inj,Quad PF,6+ Mos 05/03/2014, 06/06/2015, 05/10/2016, 05/26/2017, 06/09/2018  . PFIZER SARS-COV-2 Vaccination 10/05/2019, 10/30/2019, 06/05/2020  . Pneumococcal Conjugate-13 01/29/2015  . Pneumococcal Polysaccharide-23 05/10/2016  . Td 08/30/2002, 04/27/2019  . Tdap 05/03/2014  . Zoster 09/07/2012    TDAP status: Up to date Flu Vaccine status: Up to date Pneumococcal vaccine status: Up to date Covid-19 vaccine status: Completed vaccines  Qualifies for Shingles Vaccine? Yes   Zostavax completed Yes   Shingrix Completed?: No.    Education has been provided regarding the importance of this vaccine. Patient has been advised to call insurance company to determine out of pocket expense if they have not yet received this vaccine. Advised may also receive vaccine at local  pharmacy or Health Dept. Verbalized acceptance and understanding.  Screening Tests Health Maintenance  Topic Date Due  . MAMMOGRAM  09/16/2021  . COLONOSCOPY  08/11/2023  . TETANUS/TDAP  04/26/2029  . INFLUENZA VACCINE  Completed  . DEXA SCAN  Completed  . COVID-19 Vaccine  Completed  . Hepatitis C Screening  Completed  . PNA vac Low Risk Adult  Completed    Health Maintenance  There are no preventive care reminders to display for this patient.  Colorectal cancer screening: Completed 08/10/2013. Repeat every 10 years Mammogram status: Completed 09/07/2019. Repeat every year Bone Density status: due, will discuss with provider at upcoming visit  Lung Cancer Screening: (Low Dose CT Chest recommended if Age 48-80 years, 30 pack-year currently smoking OR have quit w/in 15 years.) does not qualify.    Additional Screening:  Hepatitis C Screening: does qualify; Completed 09/28/2006  Vision Screening: Recommended annual ophthalmology exams for early detection of glaucoma and other disorders of the eye. Is the patient up to date with their annual eye exam?  Yes  Who is the provider or what is the name of the office in which the patient attends annual eye exams? Dr. Delman Cheadle If pt is not established with a provider, would they like to be referred to a provider to establish care? No .   Dental Screening: Recommended annual dental exams for proper oral hygiene  Community Resource Referral / Chronic Care Management: CRR required this visit?  No   CCM required this visit?  No      Plan:     I have personally reviewed and noted the following in the patient's chart:   . Medical and social history . Use of alcohol, tobacco or illicit drugs  . Current medications and supplements . Functional ability and status . Nutritional status . Physical activity . Advanced directives . List of other physicians . Hospitalizations, surgeries, and ER visits in previous 12  months . Vitals . Screenings to include cognitive, depression, and falls . Referrals and appointments  In addition, I have reviewed and discussed with patient certain preventive protocols, quality metrics, and best practice recommendations. A written personalized care plan for preventive services as well as general preventive health recommendations were provided to patient.   Due to this being a telephonic visit, the after visit summary with patients personalized plan was offered to patient via office or my-chart. Patient preferred to pick up at office at next visit or via mychart.   Andrez Grime, LPN   [id number]

## 2020-07-11 ENCOUNTER — Ambulatory Visit (INDEPENDENT_AMBULATORY_CARE_PROVIDER_SITE_OTHER): Payer: Medicare Other | Admitting: Family Medicine

## 2020-07-11 ENCOUNTER — Encounter: Payer: Self-pay | Admitting: Family Medicine

## 2020-07-11 ENCOUNTER — Other Ambulatory Visit: Payer: Self-pay

## 2020-07-11 VITALS — BP 136/80 | HR 80 | Temp 97.5°F | Ht 64.0 in | Wt 164.4 lb

## 2020-07-11 DIAGNOSIS — E038 Other specified hypothyroidism: Secondary | ICD-10-CM

## 2020-07-11 DIAGNOSIS — R232 Flushing: Secondary | ICD-10-CM | POA: Diagnosis not present

## 2020-07-11 DIAGNOSIS — Z0000 Encounter for general adult medical examination without abnormal findings: Secondary | ICD-10-CM

## 2020-07-11 DIAGNOSIS — Z888 Allergy status to other drugs, medicaments and biological substances status: Secondary | ICD-10-CM | POA: Diagnosis not present

## 2020-07-11 DIAGNOSIS — E119 Type 2 diabetes mellitus without complications: Secondary | ICD-10-CM | POA: Diagnosis not present

## 2020-07-11 LAB — T4, FREE: Free T4: 0.78 ng/dL (ref 0.60–1.60)

## 2020-07-11 LAB — TSH: TSH: 2.45 u[IU]/mL (ref 0.35–4.50)

## 2020-07-11 MED ORDER — DICLOFENAC SODIUM 1 % EX GEL: 4.0000 g | Freq: Four times a day (QID) | CUTANEOUS | 3 refills | Status: DC

## 2020-07-11 MED ORDER — PANTOPRAZOLE SODIUM 40 MG PO TBEC
40.0000 mg | DELAYED_RELEASE_TABLET | Freq: Two times a day (BID) | ORAL | 3 refills | Status: DC
Start: 2020-07-11 — End: 2021-05-12

## 2020-07-11 NOTE — Progress Notes (Signed)
Subjective:    Patient ID: [name], female    DOB: [birth year], 69 y.o.   MRN: [id number]  This visit occurred during the SARS-CoV-2 public health emergency.  Safety protocols were in place, including screening questions prior to the visit, additional usage of staff PPE, and extensive cleaning of exam room while observing appropriate contact time as indicated for disinfecting solutions.    HPI Here for health maintenance exam and to review chronic medical problems    Had amw on 11/5-noted need for dexa  Wt Readings from Last 3 Encounters:  07/11/20 164 lb 6 oz (74.6 kg)  07/10/19 165 lb 8 oz (75.1 kg)  04/27/19 162 lb 4 oz (73.6 kg)   28.21 kg/m   Had 3 fusions in neck in June  Some pain in L shoulder now   Mammogram 1/21 Self breast exam - no lumps that she notes Has 10% breast tissue left after mastectomy   Colonoscopy 12/14  dexa 10/19 -normal bmd  Takes ppi [place] - one fall - tried to jump over a ditch  [employer] -D and calcium  Exercise - not as much as she used to (heel and neck pain)  Does get 10, 000 steps in   covid status -immunized with booster  zostavax 1/14  Unsure if interested in the shingrix    BP Readings from Last 3 Encounters:  07/11/20 136/80  07/10/19 126/74  06/14/19 130/89   Pulse Readings from Last 3 Encounters:  07/11/20 80  07/10/19 73  06/14/19 80    Prediabetes Lab Results  Component Value Date   HGBA1C 6.5 07/04/2020   Up from 6.4 Tries to watch diet but occ cheats  Has a family hx of diabetes  Was 6.7 with [employer] came out but that was after prednisone   Interested in monitoring glucose  Her feet burn at night    TSH is high  Lab Results  Component Value Date   TSH 4.93 (H) 07/04/2020    Some muscle pain  Hot flashes are driving her crazy  Has dry skin and hair loss  Nails are brittle  Energy is some less    Her friend had hot flashes from cancer  (any time but worse at night)  Keeps  her from sleeping  Would like to see endocrinologist  She has a friend who got hot flashes from carcinoid so she is worried   Has bumps on heels  (pain- cannot let anything touch her heels)  Will be getting surgery  Sees Dr Latanya Maudlin   Lab Results  Component Value Date   CREATININE 0.62 07/04/2020   BUN 22 07/04/2020   NA 139 07/04/2020   K 4.5 07/04/2020   CL 102 07/04/2020   CO2 28 07/04/2020   Lab Results  Component Value Date   ALT 19 07/04/2020   AST 16 07/04/2020   ALKPHOS 61 07/04/2020   BILITOT 0.8 07/04/2020   glucose 100   Lab Results  Component Value Date   WBC 6.3 07/04/2020   HGB 13.2 07/04/2020   HCT 40.7 07/04/2020   MCV 87.0 07/04/2020   PLT 281.0 07/04/2020     Cholesterol  Lab Results  Component Value Date   CHOL 209 (H) 07/04/2020   CHOL 191 06/08/2019   CHOL 188 06/06/2018   Lab Results  Component Value Date   HDL 69.30 07/04/2020   HDL 62.10 06/08/2019   HDL 65.10 06/06/2018   Lab Results  Component Value Date  LDLCALC 107 (H) 07/04/2020   LDLCALC 92 06/08/2019   LDLCALC 105 (H) 05/26/2017   Lab Results  Component Value Date   TRIG 163.0 (H) 07/04/2020   TRIG 180.0 (H) 06/08/2019   TRIG 205.0 (H) 06/06/2018   Lab Results  Component Value Date   CHOLHDL 3 07/04/2020   CHOLHDL 3 06/08/2019   CHOLHDL 3 06/06/2018   Lab Results  Component Value Date   LDLDIRECT 97.0 06/06/2018   LDLDIRECT 119.0 05/24/2013   LDLDIRECT 117.3 02/24/2012  statin allergic /rash  Does take fish oil     occ fatty good but not a lot  Patient Active Problem List   Diagnosis Date Noted  . Hot flashes 07/11/2020  . Subclinical hypothyroidism 07/11/2020  . Allergy to statin medication 07/11/2020  . Medicare annual wellness visit, subsequent 07/10/2019  . Fatigue 04/06/2019  . Pressure in head 04/06/2019  . Paresthesia 04/06/2019  . Foot pain, bilateral 04/06/2019  . Vaginal atrophy 06/09/2018  . Thoracic back pain 09/27/2017  . Breast cancer  screening 05/31/2017  . Controlled type 2 diabetes mellitus without complication, without long-term current use of insulin ([place]) 06/02/2016  . Stress reaction 05/10/2016  . Estrogen deficiency 01/29/2015  . Routine general medical examination at a health care facility 02/20/2012  . GERD 06/20/2008   Past Medical History:  Diagnosis Date  . Depression   . Fatty liver   . Fibrocystic disease of both breasts   . GERD (gastroesophageal reflux disease)   . Hyperglycemia   . 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 Sphincter of Oddi dysfunction    Past Surgical History:  Procedure Laterality Date  . ABDOMINAL HYSTERECTOMY    . CARPAL TUNNEL RELEASE  08/2008   left  . ERCP    . LEFT HEART CATHETERIZATION WITH CORONARY ANGIOGRAM N/A 06/08/2013   Procedure: LEFT HEART CATHETERIZATION WITH CORONARY ANGIOGRAM;  Surgeon: Ramond Dial, MD;  Location: [facility] CATH LAB;  Service: Cardiovascular;  Laterality: N/A;  . MASTECTOMY     bilateral for severe macrocystic breast  . SPINE SURGERY N/A 02/12/2020   neck   Social History   Tobacco Use  . Smoking status: Never Smoker  . Smokeless tobacco: Never Used  Vaping Use  . Vaping Use: Never used  Substance Use Topics  . Alcohol use: Not Currently    Alcohol/week: 0.0 standard drinks    Comment: occ  . Drug use: No   Family History  Problem Relation Age of Onset  . Hypertension Father   . Cancer Father        prostate and leukemia  . Cancer Maternal Aunt        Pancreatic  . Diabetes Maternal Uncle   . Heart disease Maternal Grandmother   . Brain cancer Mother        hospice  . Breast cancer Sister 56  . Cancer Sister    Allergies  Allergen Reactions  . Atorvastatin     REACTION: rash  . Ezetimibe     REACTION: muscle aches  . Prednisone Other (See Comments)    **INJECTIONS ONLY** caused tachycardia   . Sulfamethoxazole-Trimethoprim Other (See Comments)    Swelling of  nostrils   Current Outpatient Medications on File Prior to Visit  Medication Sig Dispense Refill  . acetaminophen (TYLENOL) 500 MG tablet Take 500 mg by mouth at bedtime.     . ALPRAZolam (XANAX) 1 MG tablet TAKE 1/2 TABLET  BY MOUTH AT BEDTIME AS NEEDED FOR ANXIETY OR SLEEP 15 tablet 3  . ASPIRIN PO Take 81 mg by mouth daily.    . Calcium-Magnesium-Vitamin D (CALCIUM 1200+D3 PO) Take 1 tablet by mouth at bedtime.    . diclofenac sodium (VOLTAREN) 1 % GEL Apply 4 g topically 4 (four) times daily as needed. 3 Tube 3  . MELATONIN PO Take 10 mg by mouth daily.    . methocarbamol (ROBAXIN) 750 MG tablet Take 750 mg by mouth 3 (three) times daily.    . Multiple Vitamins-Minerals (EYE VITAMINS PO) Take 1 capsule by mouth daily.    . Omega-3 Fatty Acids (OMEGA-3 FISH OIL PO) Take 1,000 mg by mouth daily.    . traMADol (ULTRAM) 50 MG tablet     . traMADol (ULTRAM) 50 MG tablet Take 50 mg by mouth every 6 (six) hours as needed.     No current facility-administered medications on file prior to visit.    Review of Systems  Constitutional: Positive for fatigue. Negative for activity change, appetite change, fever and unexpected weight change.  HENT: Negative for congestion, ear pain, rhinorrhea, sinus pressure and sore throat.   Eyes: Negative for pain, redness and visual disturbance.  Respiratory: Negative for cough, shortness of breath and wheezing.   Cardiovascular: Negative for chest pain and palpitations.  Gastrointestinal: Negative for abdominal pain, blood in stool, constipation and diarrhea.  Endocrine: Positive for heat intolerance. Negative for cold intolerance, polydipsia and polyuria.       Hot flashes   Genitourinary: Negative for dysuria, frequency and urgency.  Musculoskeletal: Negative for arthralgias, back pain and myalgias.  Skin: Negative for pallor and rash.  Allergic/Immunologic: Negative for environmental allergies.  Neurological: Negative for dizziness, syncope and headaches.   Hematological: Negative for adenopathy. Does not bruise/bleed easily.  Psychiatric/Behavioral: Negative for decreased concentration and dysphoric mood. The patient is not nervous/anxious.        Objective:   Physical Exam Constitutional:      General: She is not in acute distress.    Appearance: Normal appearance. She is well-developed and normal weight. She is not ill-appearing or diaphoretic.  HENT:     Head: Normocephalic and atraumatic.     Right Ear: Tympanic membrane, ear canal and external ear normal.     Left Ear: Tympanic membrane, ear canal and external ear normal.     Nose: Nose normal. No congestion.     Mouth/Throat:     Mouth: Mucous membranes are moist.     Pharynx: Oropharynx is clear. No posterior oropharyngeal erythema.  Eyes:     General: No scleral icterus.    Extraocular Movements: Extraocular movements intact.     Conjunctiva/sclera: Conjunctivae normal.     Pupils: Pupils are equal, round, and reactive to light.  Neck:     Thyroid: No thyromegaly.     Vascular: No carotid bruit or JVD.  Cardiovascular:     Rate and Rhythm: Normal rate and regular rhythm.     Pulses: Normal pulses.     Heart sounds: Normal heart sounds. No gallop.   Pulmonary:     Effort: Pulmonary effort is normal. No respiratory distress.     Breath sounds: Normal breath sounds. No wheezing.     Comments: Good air exch Chest:     Chest wall: No tenderness.  Abdominal:     General: Bowel sounds are normal. There is no distension or abdominal bruit.     Palpations: Abdomen is soft. There  is no mass.     Tenderness: There is no abdominal tenderness.     Hernia: No hernia is present.  Genitourinary:    Comments: Breast exam: No mass, nodules, thickening, tenderness, bulging, retraction, inflamation, nipple discharge or skin changes noted.  No axillary or clavicular LA.     Implants with only small amt of breast tissue left is her baseline (surgical changes noted) Musculoskeletal:          General: No tenderness. Normal range of motion.     Cervical back: Normal range of motion and neck supple. No rigidity. No muscular tenderness.     Right lower leg: No edema.     Left lower leg: No edema.     Comments: No kyphosis   Lymphadenopathy:     Cervical: No cervical adenopathy.  Skin:    General: Skin is warm and dry.     Coloration: Skin is not pale.     Findings: No erythema or rash.     Comments: Solar lentigines diffusely    Neurological:     Mental Status: She is alert. Mental status is at baseline.     Cranial Nerves: No cranial nerve deficit.     Motor: No abnormal muscle tone.     Coordination: Coordination normal.     Gait: Gait normal.     Deep Tendon Reflexes: Reflexes are normal and symmetric. Reflexes normal.  Psychiatric:        Mood and Affect: Mood normal.        Cognition and Memory: Cognition and memory normal.           Assessment & Plan:   Problem List Items Addressed This Visit      Cardiovascular and Mediastinum   Hot flashes    Since and past menopause -worse instead of better  Pt interested on w/u for conditions other than menopause  FT4 done today in resp slt elevation in TSH No s/s of infection and no wt loss Ref to endocrinology requested      Relevant Orders   Ambulatory referral to Endocrinology     Endocrine   Controlled type 2 diabetes mellitus without complication, without long-term current use of insulin ([facility])    A1C is newly 6.5  Diet is about the same fam h/o diabetes disc imp of low glycemic diet and wt loss to prevent DM2 Px for dm supplies done-pt will start to track  Wants to hold off on metformin for now        Relevant Orders   For home use only DME Other see comment   Subclinical hypothyroidism    Lab Results  Component Value Date   TSH 4.93 (H) 07/04/2020   Re check TSH and FT4 Dry skin noted  She has had hot flashes for years      Relevant Orders   TSH (Completed)   T4, free (Completed)      Other   Routine general medical examination at a health care facility - Primary    Reviewed health habits including diet and exercise and skin cancer prevention Reviewed appropriate screening tests for age  Also reviewed health mt list, fam hx and immunization status , as well as social and family history   See HPI Labs reviewed  dexa nl in 2019   No fractures /disc fall prev  Encouraged regular exercise covid immunized with booster  Discussed shingrix vaccine       Allergy to statin medication    LDL  cholesterol is 107

## 2020-07-11 NOTE — Patient Instructions (Addendum)
If you are interested in the new shingles vaccine (Shingrix) - call your local pharmacy to check on coverage and availability  If affordable, get on a wait list at your pharmacy to get the vaccine.  Try to get most of your carbohydrates from produce (with the exception of white potatoes)  Eat less bread/pasta/rice/snack foods/cereals/sweets and other items from the middle of the grocery store (processed carbs)    For cholesterol Avoid red meat/ fried foods/ egg yolks/ fatty breakfast meats/ butter, cheese and high fat dairy/ and shellfish    Let us know if you want to start metformin  Follow up in 3 or 6 months depending on blood sugars   Our office will call you regarding the endocrinology referral

## 2020-07-11 NOTE — Assessment & Plan Note (Signed)
Lab Results  Component Value Date   TSH 4.93 (H) 07/04/2020   Re check TSH and FT4 Dry skin noted  She has had hot flashes for years

## 2020-07-11 NOTE — Assessment & Plan Note (Signed)
LDL cholesterol is 107

## 2020-07-11 NOTE — Assessment & Plan Note (Signed)
Since and past menopause -worse instead of better  Pt interested on w/u for conditions other than menopause  FT4 done today in resp slt elevation in TSH No s/s of infection and no wt loss Ref to endocrinology requested

## 2020-07-11 NOTE — Assessment & Plan Note (Signed)
A1C is newly 6.5  Diet is about the same fam h/o diabetes disc imp of low glycemic diet and wt loss to prevent DM2 Px for dm supplies done-pt will start to track  Wants to hold off on metformin for now

## 2020-07-12 NOTE — Assessment & Plan Note (Signed)
Reviewed health habits including diet and exercise and skin cancer prevention Reviewed appropriate screening tests for age  Also reviewed health mt list, fam hx and immunization status , as well as social and family history   See HPI Labs reviewed  dexa nl in 2019   No fractures /disc fall prev  Encouraged regular exercise covid immunized with booster  Discussed shingrix vaccine

## 2020-07-16 DIAGNOSIS — M25512 Pain in left shoulder: Secondary | ICD-10-CM | POA: Diagnosis not present

## 2020-07-16 DIAGNOSIS — M79671 Pain in right foot: Secondary | ICD-10-CM | POA: Diagnosis not present

## 2020-07-23 ENCOUNTER — Other Ambulatory Visit: Payer: Self-pay | Admitting: Family Medicine

## 2020-07-23 NOTE — Telephone Encounter (Signed)
Refill request for ALPRAZolam (XANAX) 1 MG tablet.  LOV 07/11/20 Next OV not scheduled Last refilled 03/26/20

## 2020-08-15 DIAGNOSIS — M25512 Pain in left shoulder: Secondary | ICD-10-CM | POA: Diagnosis not present

## 2020-09-08 ENCOUNTER — Encounter: Payer: Self-pay | Admitting: Endocrinology

## 2020-09-08 ENCOUNTER — Other Ambulatory Visit: Payer: Self-pay

## 2020-09-08 ENCOUNTER — Ambulatory Visit: Payer: Medicare Other | Admitting: Endocrinology

## 2020-09-08 VITALS — BP 126/86 | HR 74 | Ht 64.0 in | Wt 166.0 lb

## 2020-09-08 DIAGNOSIS — R232 Flushing: Secondary | ICD-10-CM | POA: Diagnosis not present

## 2020-09-08 NOTE — Progress Notes (Signed)
Subjective:    Patient ID: [name], female    DOB: [birth year], 70 y.o.   MRN: [id number]  HPI Pt is referred by Dr Glori Bickers, for hot flashes.  she reports many years hot flashes.  She gets these sxs nightly.  She took ERT in approx 2011, but she stopped for safety reasons.  She reports alternating heat and cold intolerance during episodes, and excessive diaphoresis.  she has no h/o HTN.  she has no h/o adrenal disease, migraine headache, MTC, alcohol withdrawal, neurofibromas, hemangiomas, brain aneurysm, cocaine use, or thyroid disease.    Past Medical History:  Diagnosis Date  . Depression   . Fatty liver   . Fibrocystic disease of both breasts   . GERD (gastroesophageal reflux disease)   . Hyperglycemia   . 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 Sphincter of Oddi dysfunction     Past Surgical History:  Procedure Laterality Date  . ABDOMINAL HYSTERECTOMY    . CARPAL TUNNEL RELEASE  08/2008   left  . ERCP    . LEFT HEART CATHETERIZATION WITH CORONARY ANGIOGRAM N/A 06/08/2013   Procedure: LEFT HEART CATHETERIZATION WITH CORONARY ANGIOGRAM;  Surgeon: Ramond Dial, MD;  Location: [facility] CATH LAB;  Service: Cardiovascular;  Laterality: N/A;  . MASTECTOMY     bilateral for severe macrocystic breast  . SPINE SURGERY N/A 02/12/2020   neck    Social History   Socioeconomic History  . Marital status: Married    Spouse name: Not on file  . Number of children: Not on file  . Years of education: Not on file  . Highest education level: Not on file  Occupational History  . Not on file  Tobacco Use  . Smoking status: Never Smoker  . Smokeless tobacco: Never Used  Vaping Use  . Vaping Use: Never used  Substance and Sexual Activity  . Alcohol use: Not Currently    Alcohol/week: 0.0 standard drinks    Comment: occ  . Drug use: No  . Sexual activity: Yes    Birth control/protection: Surgical  Other Topics Concern  . Not  on file  Social History Narrative  . Not on file   Social Determinants of Health   Financial Resource Strain: Low Risk   . Difficulty of Paying Living Expenses: Not hard at all  Food Insecurity: No Food Insecurity  . Worried About Charity fundraiser in the Last Year: Never true  . Ran Out of Food in the Last Year: Never true  Transportation Needs: No Transportation Needs  . Lack of Transportation (Medical): No  . Lack of Transportation (Non-Medical): No  Physical Activity: Insufficiently Active  . Days of Exercise per Week: 7 days  . Minutes of Exercise per Session: 10 min  Stress: Stress Concern Present  . Feeling of Stress : To some extent  Social Connections: Not on file  Intimate Partner Violence: Not At Risk  . Fear of Current or Ex-Partner: No  . Emotionally Abused: No  . Physically Abused: No  . Sexually Abused: No    Current Outpatient Medications on File Prior to Visit  Medication Sig Dispense Refill  . acetaminophen (TYLENOL) 500 MG tablet Take 500 mg by mouth at bedtime.    . ALPRAZolam (XANAX) 1 MG tablet TAKE 1/2 TABLET BY MOUTH AT BEDTIME AS NEEDED FOR ANXIETY OR SLEEP 15 tablet 3  . ASPIRIN PO Take 81 mg by mouth daily.    Marland Kitchen  Calcium-Magnesium-Vitamin D (CALCIUM 1200+D3 PO) Take 1 tablet by mouth at bedtime.    . diclofenac sodium (VOLTAREN) 1 % GEL Apply 4 g topically 4 (four) times daily as needed. 3 Tube 3  . diclofenac Sodium (VOLTAREN) 1 % GEL Apply 4 g topically 4 (four) times daily. To affected areas 150 g 3  . MELATONIN PO Take 10 mg by mouth daily.    . methocarbamol (ROBAXIN) 750 MG tablet Take 750 mg by mouth 3 (three) times daily.    . Multiple Vitamins-Minerals (EYE VITAMINS PO) Take 1 capsule by mouth daily.    . Omega-3 Fatty Acids (OMEGA-3 FISH OIL PO) Take 1,000 mg by mouth daily.    . pantoprazole (PROTONIX) 40 MG tablet Take 1 tablet (40 mg total) by mouth 2 (two) times daily. 180 tablet 3  . traMADol (ULTRAM) 50 MG tablet      No current  facility-administered medications on file prior to visit.    Allergies  Allergen Reactions  . Atorvastatin     REACTION: rash  . Ezetimibe     REACTION: muscle aches  . Prednisone Other (See Comments)    **INJECTIONS ONLY** caused tachycardia   . Sulfamethoxazole-Trimethoprim Other (See Comments)    Swelling of nostrils    Family History  Problem Relation Age of Onset  . Hypertension Father   . Cancer Father        prostate and leukemia  . Cancer Maternal Aunt        Pancreatic  . Diabetes Maternal Uncle   . Heart disease Maternal Grandmother   . Brain cancer Mother        hospice  . Breast cancer Sister 55  . Cancer Sister     BP 126/86   Pulse 74   Ht 5\' 4"  (1.626 m)   Wt 166 lb (75.3 kg)   SpO2 99%   BMI 28.49 kg/m    Review of Systems denies pallor, n/v, syncope, diarrhea, weight loss, chest pain, sob, anxiety, palpitations, and fever.       Objective:   Physical Exam VS: see vs page GEN: no distress HEAD: head: no deformity eyes: no periorbital swelling, no proptosis external nose and ears are normal NECK: supple, thyroid is not enlarged CHEST WALL: no deformity LUNGS: clear to auscultation CV: reg rate and rhythm, no murmur.  MUSCULOSKELETAL: gait is normal and steady EXTEMITIES: no deformity.  no leg edema NEURO:  readily moves all 4's.  sensation is intact to touch on all 4's SKIN:  Normal texture and temperature.  No rash or suspicious lesion is visible.   NODES:  None palpable at the neck PSYCH: alert, well-oriented.  Does not appear anxious nor depressed.  Lab Results  Component Value Date   TSH 2.45 07/11/2020   CT (2008) adrenals are normal.    I have reviewed outside records, and summarized:  Pt was noted to have sxs of of flashes, and referred here.  Diabetes, wellness, dyslipidemia, and hypothyroidism were also addressed.       Assessment & Plan:  Hot flashes, new to me, uncertain etiology and prognosis.     Patient  Instructions  Blood tests, and a 24-HR urine collection, are requested for you today.  We'll let you know about the results.  Your thyroid will go underactive again at some point, so it is good that Dr Glori Bickers is checking this.  Here is a blood sugar meter, and some strips.  Please check when you have these episodes,  and call if the blood sugar is below 50.

## 2020-09-08 NOTE — Patient Instructions (Addendum)
Blood tests, and a 24-HR urine collection, are requested for you today.  We'll let you know about the results.  Your thyroid will go underactive again at some point, so it is good that Dr Glori Bickers is checking this.  Here is a blood sugar meter, and some strips.  Please check when you have these episodes, and call if the blood sugar is below 50.

## 2020-09-10 ENCOUNTER — Other Ambulatory Visit: Payer: Medicare Other

## 2020-09-10 DIAGNOSIS — R232 Flushing: Secondary | ICD-10-CM

## 2020-09-12 ENCOUNTER — Other Ambulatory Visit (HOSPITAL_COMMUNITY)
Admission: EM | Admit: 2020-09-12 | Discharge: 2020-09-12 | Disposition: A | Discharge status: Home / Self Care | Payer: Medicare Other | Attending: Orthopaedic Surgery | Admitting: Orthopaedic Surgery

## 2020-09-12 DIAGNOSIS — M25512 Pain in left shoulder: Secondary | ICD-10-CM | POA: Insufficient documentation

## 2020-09-12 LAB — CBC WITH DIFFERENTIAL/PLATELET
Abs Immature Granulocytes: 0.02 10*3/uL (ref 0.00–0.07)
Basophils Absolute: 0 10*3/uL (ref 0.0–0.1)
Basophils Relative: 0 %
Eosinophils Absolute: 0.2 10*3/uL (ref 0.0–0.5)
Eosinophils Relative: 2 %
HCT: 42.3 % (ref 36.0–46.0)
Hemoglobin: 13.5 g/dL (ref 12.0–15.0)
Immature Granulocytes: 0 %
Lymphocytes Relative: 23 %
Lymphs Abs: 2.1 10*3/uL (ref 0.7–4.0)
MCH: 28.9 pg (ref 26.0–34.0)
MCHC: 31.9 g/dL (ref 30.0–36.0)
MCV: 90.6 fL (ref 80.0–100.0)
Monocytes Absolute: 0.5 10*3/uL (ref 0.1–1.0)
Monocytes Relative: 5 %
Neutro Abs: 6.4 10*3/uL (ref 1.7–7.7)
Neutrophils Relative %: 70 %
Platelets: 278 10*3/uL (ref 150–400)
RBC: 4.67 MIL/uL (ref 3.87–5.11)
RDW: 13.2 % (ref 11.5–15.5)
WBC: 9.2 10*3/uL (ref 4.0–10.5)
nRBC: 0 % (ref 0.0–0.2)

## 2020-09-12 LAB — SEDIMENTATION RATE: Sed Rate: 4 mm/hr (ref 0–22)

## 2020-09-12 LAB — C-REACTIVE PROTEIN: CRP: 2.1 mg/dL — ABNORMAL HIGH (ref <1.0)

## 2020-09-13 LAB — CATECHOLAMINES, FRACTIONATED, PLASMA
Dopamine: 17 pg/mL
Epinephrine: <20 pg/mL
Norepinephrine: 822 pg/mL
Total Catecholamines: 839 pg/mL

## 2020-09-13 LAB — METANEPHRINES, PLASMA
Metanephrine, Free: <25 pg/mL (ref <=57)
Normetanephrine, Free: 112 pg/mL (ref <=148)
Total Metanephrines-Plasma: 112 pg/mL (ref <=205)

## 2020-09-13 LAB — RHEUMATOID FACTOR: Rheumatoid fact SerPl-aCnc: <10 IU/mL (ref <14.0)

## 2020-09-13 LAB — CALCITONIN: Calcitonin: <2 pg/mL (ref <=5)

## 2020-09-13 LAB — CYCLIC CITRUL PEPTIDE ANTIBODY, IGG/IGA: CCP Antibodies IgG/IgA: 6 units (ref 0–19)

## 2020-09-13 LAB — ANA W/REFLEX: Anti Nuclear Antibody (ANA): NEGATIVE

## 2020-09-14 LAB — 5 HIAA, QUANTITATIVE, URINE, 24 HOUR
5 HIAA, 24 Hour Urine: 2.9 mg/24 h (ref <=6.0)
Total Volume: 1900 mL

## 2020-09-16 LAB — METANEPHRINES, URINE, 24 HOUR
METANEPHRINE: 114 mcg/24 h (ref 90–315)
METANEPHRINES, TOTAL: 491 mcg/24 h (ref 224–832)
NORMETANEPHRINE: 377 mcg/24 h (ref 122–676)
Total Volume: 1900 mL

## 2020-09-16 LAB — CATECHOLAMINES, FRACTIONATED, URINE, 24 HOUR: Total Volume: 1900

## 2020-09-24 ENCOUNTER — Telehealth: Payer: Self-pay

## 2020-09-24 NOTE — Telephone Encounter (Signed)
Pt c/o fall 8-9 days ago and hitting knee on brick step . Knee is swollen and warm, but less swollen and warm than when injury occurred. There is a nickel sized indention/scab, no puss, some redness. Pt reports pain during the first couple of days and then no pain and then started waking her up about 3 nights ago with throbbing. Pt has been keeping clean and using abx cream. Pt also reports she feels "on the loopy side today."  She could not elaborate except that she feels a little woozy and has "just a little spinning when she bends over and stands back upright." Pt denies any vision changes, one -sided weakness or any other changes with this feeling. Pt reports she is afebrile. Pt did report "a slight HA, " but this could be from overnight company that just left.  Pt did check her BP while on the phone and reported it as 175/96 and 167/99 but later revealed she was walking around the house when she received those readings. Advised pt to sit down and how to properly take BP and had pt relax for several minutes. Pt took two more readings and they were 153/84 and 140/89.   Scheduled pt with Dr. Einar Pheasant for knee injury assessment for tomorrow. Advised pt if any symptoms change or worsen or she develops any new symptoms to contact this office. Advised of ER precautions. Pt verbalized understanding.

## 2020-09-25 ENCOUNTER — Ambulatory Visit (INDEPENDENT_AMBULATORY_CARE_PROVIDER_SITE_OTHER): Payer: Medicare Other | Admitting: Family Medicine

## 2020-09-25 ENCOUNTER — Other Ambulatory Visit: Payer: Self-pay | Admitting: Family Medicine

## 2020-09-25 ENCOUNTER — Ambulatory Visit (INDEPENDENT_AMBULATORY_CARE_PROVIDER_SITE_OTHER)
Admission: RE | Admit: 2020-09-25 | Discharge: 2020-09-25 | Disposition: A | Discharge status: Home / Self Care | Payer: Medicare Other | Source: Ambulatory Visit | Attending: Family Medicine | Admitting: Family Medicine

## 2020-09-25 ENCOUNTER — Other Ambulatory Visit: Payer: Self-pay

## 2020-09-25 VITALS — BP 130/80 | HR 74 | Temp 97.6°F | Wt 168.0 lb

## 2020-09-25 DIAGNOSIS — M25562 Pain in left knee: Secondary | ICD-10-CM

## 2020-09-25 DIAGNOSIS — Z043 Encounter for examination and observation following other accident: Secondary | ICD-10-CM | POA: Diagnosis not present

## 2020-09-25 NOTE — Assessment & Plan Note (Addendum)
XR without acute finding. Will f/u final read.  Patella J brace and continue prn NSAIDs. She will f/u with her ortho provider in 3 weeks

## 2020-09-25 NOTE — Progress Notes (Signed)
   Subjective:     [name] is a 70 y.o. female presenting for Fall (X 9 days ago ) and Knee Pain (L x 9 days )     HPI  #Knee pain - fall 9 days ago - swelling initially - was able to walk - has a scab - hip a sharp piece of brick in her house - has been waking her up at night due to pain - started taking advil - pain on the right and left side of the knee - wondering if she has a small fracture - moving her knee well -  Review of Systems   Social History   Tobacco Use  Smoking Status Never Smoker  Smokeless Tobacco Never Used        Objective:    BP Readings from Last 3 Encounters:  09/25/20 130/80  09/08/20 126/86  07/11/20 136/80   Wt Readings from Last 3 Encounters:  09/25/20 168 lb (76.2 kg)  09/08/20 166 lb (75.3 kg)  07/11/20 164 lb 6 oz (74.6 kg)    BP 130/80   Pulse 74   Temp 97.6 F (36.4 C) (Temporal)   Wt 168 lb (76.2 kg)   SpO2 99%   BMI 28.84 kg/m    Physical Exam Constitutional:      General: She is not in acute distress.    Appearance: She is well-developed. She is not diaphoretic.  HENT:     Right Ear: External ear normal.     Left Ear: External ear normal.  Eyes:     Conjunctiva/sclera: Conjunctivae normal.  Cardiovascular:     Rate and Rhythm: Normal rate.  Pulmonary:     Effort: Pulmonary effort is normal.  Musculoskeletal:     Cervical back: Neck supple.     Comments: Left knee: Inspection: mild medial swelling, bruising distal to the patella, healing scab on the knee cap Palpation: TTP along the lateral patella, proximal tibia and lateral distal femur.  ROM: normal w/o pain Strength: normal Ligaments: intact   Skin:    General: Skin is warm and dry.     Capillary Refill: Capillary refill takes less than 2 seconds.  Neurological:     Mental Status: She is alert. Mental status is at baseline.  Psychiatric:        Mood and Affect: Mood normal.        Behavior: Behavior normal.     Left knee XR: No acute  fracture. Some patella changes. Reviewed by me.      Assessment & Plan:   Problem List Items Addressed This Visit      Other   Acute pain of left knee - Primary    XR without acute finding. Will f/u final read.  Patella J brace and continue prn NSAIDs. She will f/u with her ortho provider in 3 weeks          Return if symptoms worsen or fail to improve.  Lesleigh Noe, MD  This visit occurred during the SARS-CoV-2 public health emergency.  Safety protocols were in place, including screening questions prior to the visit, additional usage of staff PPE, and extensive cleaning of exam room while observing appropriate contact time as indicated for disinfecting solutions.

## 2020-09-25 NOTE — Telephone Encounter (Signed)
Pharmacy requests refill on: Diclofenac Sodium 1% Gel   LAST REFILL: 07/11/2020 (Q-150 g, R-3) LAST OV: 09/25/2020 NEXT OV: Not Scheduled  PHARMACY: [facility] [place], [place]

## 2020-09-25 NOTE — Patient Instructions (Signed)
#   Likely just a sprain - will follow-up final read - see Ortho in 2-3 weeks - patella brace today

## 2020-09-25 NOTE — Telephone Encounter (Signed)
BP normal in the office  See encounter

## 2020-10-01 ENCOUNTER — Encounter: Payer: Self-pay | Admitting: Family Medicine

## 2020-10-01 DIAGNOSIS — Z1231 Encounter for screening mammogram for malignant neoplasm of breast: Secondary | ICD-10-CM | POA: Diagnosis not present

## 2020-11-22 ENCOUNTER — Other Ambulatory Visit: Payer: Self-pay | Admitting: Family Medicine

## 2020-11-24 NOTE — Telephone Encounter (Signed)
Name of Medication: Xanax Name of Pharmacy: [facility] or Written Date and Quantity: 07/23/20 #15 tabs with 3 refills Last Office Visit and Type: fall with Dr. Einar Pheasant on 09/25/20 (CPE on 07/11/20) Next Office Visit and Type: none scheduled

## 2020-12-03 ENCOUNTER — Ambulatory Visit: Payer: Medicare Other | Attending: Internal Medicine

## 2020-12-03 DIAGNOSIS — Z23 Encounter for immunization: Secondary | ICD-10-CM

## 2020-12-03 NOTE — Progress Notes (Signed)
   Covid-19 Vaccination Clinic  Name:  [name]    MRN: [id number] DOB: 14-Oct-1950  12/03/2020  [name] was observed post Covid-19 immunization for 15 minutes without incident. She was provided with Vaccine Information Sheet and instruction to access the V-Safe system.   [name] was instructed to call 911 with any severe reactions post vaccine: Marland Kitchen Difficulty breathing  . Swelling of face and throat  . A fast heartbeat  . A bad rash all over body  . Dizziness and weakness   Immunizations Administered    Name Date Dose VIS Date Route   Moderna Covid-19 Booster Vaccine 12/03/2020 10:19 AM 0.25 mL 06/18/2020 Intramuscular   Manufacturer: Moderna   Lot: [id number]   [place]: [id number]

## 2020-12-04 DIAGNOSIS — H2513 Age-related nuclear cataract, bilateral: Secondary | ICD-10-CM | POA: Diagnosis not present

## 2020-12-04 DIAGNOSIS — H52203 Unspecified astigmatism, bilateral: Secondary | ICD-10-CM | POA: Diagnosis not present

## 2020-12-04 DIAGNOSIS — H524 Presbyopia: Secondary | ICD-10-CM | POA: Diagnosis not present

## 2020-12-04 DIAGNOSIS — E119 Type 2 diabetes mellitus without complications: Secondary | ICD-10-CM | POA: Diagnosis not present

## 2020-12-04 LAB — HM DIABETES EYE EXAM

## 2020-12-08 ENCOUNTER — Other Ambulatory Visit: Payer: Self-pay

## 2020-12-08 MED ORDER — MODERNA COVID-19 VACCINE 100 MCG/0.5ML IM SUSP
INTRAMUSCULAR | 0 refills | Status: DC
  Filled 2020-12-08: qty 0.25, 20d supply, fill #0

## 2020-12-16 ENCOUNTER — Encounter: Payer: Self-pay | Admitting: Family Medicine

## 2021-01-25 ENCOUNTER — Other Ambulatory Visit: Payer: Self-pay | Admitting: Family Medicine

## 2021-01-27 NOTE — Telephone Encounter (Signed)
LOV acute for knee pain with Dr Einar Pheasant on 09/25/20. Next appointment on 07/07/21 Wellness phone visit.  Diclofenac Last refilled on 09/25/20 #200 3 refills

## 2021-02-11 ENCOUNTER — Telehealth: Payer: Self-pay | Admitting: *Deleted

## 2021-02-11 NOTE — Telephone Encounter (Signed)
[place] [facility] [place] [place] and it was positive. Patient stated that she started yesterday with a scratchy throat, dry cough and a dull headache. Patient stated that she has been taking tylenol. Patient stated she is not sure if she has had a fever or not because she is at the beach and has no way of checking it. Patient denies SOB or difficulty breathing. Patient stated that they were coming home tomorrow but will probably go ahead and come home today. Patient was given instruction to quarantine. Patient scheduled for a virtual visit with Dr. Lorelei Pont tomorrow 02/12/21 at 2:20 pm. Patient was given ER precautions and she verbalized understanding.

## 2021-02-12 ENCOUNTER — Other Ambulatory Visit: Payer: Self-pay

## 2021-02-12 ENCOUNTER — Encounter: Payer: Self-pay | Admitting: Family Medicine

## 2021-02-12 ENCOUNTER — Telehealth (INDEPENDENT_AMBULATORY_CARE_PROVIDER_SITE_OTHER): Payer: Medicare Other | Admitting: Family Medicine

## 2021-02-12 VITALS — BP 132/74 | HR 84 | Temp 98.8°F | Ht 64.0 in

## 2021-02-12 DIAGNOSIS — U071 COVID-19: Secondary | ICD-10-CM

## 2021-02-12 MED ORDER — PAXLOVID 20 X 150 MG & 10 X 100MG PO TBPK: 3.0000 | ORAL_TABLET | Freq: Two times a day (BID) | ORAL | 0 refills | Status: DC

## 2021-02-12 NOTE — Progress Notes (Signed)
[name] T. Kareem Aul, MD [facility] and [place] at [facility] [place], [address] Phone: [phone]  FAX: [place] - 70 y.o. female  MRN [id number]  Date of Birth: 03-22-1951  Visit Date: 02/12/2021  PCP: Abner Greenspan, MD  Referred by: Tower, Wynelle Fanny, MD  Virtual Visit via Video Note:  I connected with  [name] on 02/12/2021  2:20 PM EDT by a video enabled telemedicine application and verified that I am speaking with the correct person using two identifiers.   Location patient: home computer, tablet, or smartphone Location provider: work or home office Consent: Verbal consent directly obtained from [employer]. Persons participating in the virtual visit: patient, provider  I discussed the limitations of evaluation and management by telemedicine and the availability of in person appointments. The patient expressed understanding and agreed to proceed.  Caregility failure x3, Doximity was used.  Chief Complaint  Patient presents with   Covid Positive    2 + Home Test-Symtoms started on Tuesday with scratchy throat   Nasal Congestion   Headache    Dull    Cough    History of Present Illness:  Tuesday AM woke up with a sore throat, wed nose started to run.  Home test was immediate positive.   Covid x 2 tests.   She has predominantly been having some nasal congestion, upper respiratory symptoms, headache, as well as cough.  She also has had some sore throat.  Risk: A1c is 6.5, DM Age. Body mass index is 28.84 kg/m.   Lab Results  Component Value Date   HGBA1C 6.5 07/04/2020      Immunization History  Administered Date(s) Administered   Fluad Quad(high Dose 65+) 06/18/2019, 06/12/2020   Influenza Whole 05/30/2008   Influenza,inj,Quad PF,6+ Mos 05/03/2014, 06/06/2015, 05/10/2016, 05/26/2017, 06/09/2018   Moderna SARS-COV2 Booster Vaccination 12/03/2020    PFIZER(Purple Top)SARS-COV-2 Vaccination 10/05/2019, 10/30/2019, 06/05/2020   Pneumococcal Conjugate-13 01/29/2015   Pneumococcal Polysaccharide-23 05/10/2016   Td 08/30/2002, 04/27/2019   Tdap 05/03/2014   Zoster, Live 09/07/2012     Review of Systems as above: See pertinent positives and pertinent negatives per HPI No acute distress verbally   Observations/Objective/Exam:  An attempt was made to discern vital signs over the phone and per patient if applicable and possible.   General:    Alert, Oriented, appears well and in no acute distress  Pulmonary:     On inspection no signs of respiratory distress.  Psych / Neurological:     Pleasant and cooperative.  Assessment and Plan:    ICD-10-CM   1. COVID-19  U07.1      With increased risk, initiate Paxlovid, other some basic supportive care such as Tylenol at home.  Several risk factors.  I discussed the assessment and treatment plan with the patient. The patient was provided an opportunity to ask questions and all were answered. The patient agreed with the plan and demonstrated an understanding of the instructions.   The patient was advised to call back or seek an in-person evaluation if the symptoms worsen or if the condition fails to improve as anticipated.  Follow-up: prn unless noted otherwise below No follow-ups on file.  Meds ordered this encounter  Medications   Nirmatrelvir & Ritonavir (PAXLOVID) 20 x 150 MG & 10 x 100MG  TBPK    Sig: Take 3 tablets by mouth 2 (two) times daily. Take nirmatrelvir (150mg ) 2  tablet twice daily for 5 days and ritonavir (100mg ) 1 tablet twice daily for 5 days    Dispense:  30 tablet    Refill:  0   No orders of the defined types were placed in this encounter.   Signed,  Maud Deed. Channin Agustin, MD

## 2021-03-24 ENCOUNTER — Other Ambulatory Visit: Payer: Self-pay | Admitting: Family Medicine

## 2021-03-24 NOTE — Telephone Encounter (Signed)
Name of Medication: Xanax Name of Pharmacy: [facility] or Written Date and Quantity: 11/24/20 #15 tabs with 3 refills Last Office Visit and Type: 02/12/21 virtual covid + (CPE on 07/11/20) Next Office Visit and Type: none scheduled

## 2021-04-01 ENCOUNTER — Telehealth: Payer: Self-pay | Admitting: Family Medicine

## 2021-04-01 NOTE — Telephone Encounter (Signed)
Patient call in scheduled CEP with labs prior . Patient wants to know do she need to come in before to have her A1c recheck . Please advise

## 2021-04-01 NOTE — Telephone Encounter (Signed)
Please schedule an DM f/u now

## 2021-04-01 NOTE — Telephone Encounter (Signed)
Yes =she needs a visit for diabetes and we can do a finger stick that day

## 2021-04-01 NOTE — Telephone Encounter (Signed)
See prev message, pt scheduled CPE for nov 2022, ? If she needs a lab appt before then to check A1c, last A1c was 07/04/20 at last CPE

## 2021-04-20 ENCOUNTER — Other Ambulatory Visit: Payer: Self-pay

## 2021-04-20 ENCOUNTER — Encounter: Payer: Self-pay | Admitting: Family Medicine

## 2021-04-20 ENCOUNTER — Ambulatory Visit (INDEPENDENT_AMBULATORY_CARE_PROVIDER_SITE_OTHER): Payer: Medicare Other | Admitting: Family Medicine

## 2021-04-20 VITALS — BP 132/80 | HR 67 | Temp 98.2°F | Ht 64.0 in | Wt 167.6 lb

## 2021-04-20 DIAGNOSIS — E038 Other specified hypothyroidism: Secondary | ICD-10-CM

## 2021-04-20 DIAGNOSIS — M79671 Pain in right foot: Secondary | ICD-10-CM | POA: Diagnosis not present

## 2021-04-20 DIAGNOSIS — E119 Type 2 diabetes mellitus without complications: Secondary | ICD-10-CM | POA: Diagnosis not present

## 2021-04-20 DIAGNOSIS — M79672 Pain in left foot: Secondary | ICD-10-CM | POA: Diagnosis not present

## 2021-04-20 DIAGNOSIS — R7303 Prediabetes: Secondary | ICD-10-CM

## 2021-04-20 LAB — POCT GLYCOSYLATED HEMOGLOBIN (HGB A1C): Hemoglobin A1C: 6 % — AB (ref 4.0–5.6)

## 2021-04-20 NOTE — Assessment & Plan Note (Signed)
Lab Results  Component Value Date   TSH 2.45 07/11/2020   Continue to watch  No clinical changes Still has hot flashes  Neg endo w/u for that

## 2021-04-20 NOTE — Assessment & Plan Note (Signed)
More trouble recently  Needs foot surgery - L foot/heel is worst  Encouraged use of voltaren gel instead of oral ibup due to GI intolerance  Will f/u with ortho -plans to get foot surgery

## 2021-04-20 NOTE — Patient Instructions (Addendum)
Call your orthopedic doctor to check in about feet   Stay active   Keep making diet effort  Try to get most of your carbohydrates from produce (with the exception of white potatoes)  Eat less bread/pasta/rice/snack foods/cereals/sweets and other items from the middle of the grocery store (processed carbs)  A1c is improved Good job!

## 2021-04-20 NOTE — Progress Notes (Signed)
Subjective:    Patient ID: [name], female    DOB: May 31, 1951, 70 y.o.   MRN: [id number]  This visit occurred during the SARS-CoV-2 public health emergency.  Safety protocols were in place, including screening questions prior to the visit, additional usage of staff PPE, and extensive cleaning of exam room while observing appropriate contact time as indicated for disinfecting solutions.   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  Saw endocrinology for her hot flashes and she did get a sample glucose monitor  Still has hot flashes   Lab Results  Component Value Date   HGBA1C 6.0 (A) 04/20/2021     Has taken ibuprofen for heel problem /feet hurt  Hurts her stomach  Dr Latanya Maudlin was going to do surgery on feet eventually - plans to call him     Subclinical hypothyroidism Lab Results  Component Value Date   TSH 2.45 07/11/2020    Was nl in nov  Watched FT4 also   Lab Results  Component Value Date   CHOL 209 (H) 07/04/2020   HDL 69.30 07/04/2020   LDLCALC 107 (H) 07/04/2020   LDLDIRECT 97.0 06/06/2018   TRIG 163.0 (H) 07/04/2020   CHOLHDL 3 07/04/2020   Allergic to statin   Patient Active Problem List   Diagnosis Date Noted   Acute pain of left knee 09/25/2020   Hot flashes 07/11/2020   Subclinical hypothyroidism 07/11/2020   Allergy to statin  medication 07/11/2020   Medicare annual wellness visit, subsequent 07/10/2019   Fatigue 04/06/2019   Pressure in head 04/06/2019   Paresthesia 04/06/2019   Foot pain, bilateral 04/06/2019   Vaginal atrophy 06/09/2018   Thoracic back pain 09/27/2017   Breast cancer screening 05/31/2017   Prediabetes 06/02/2016   Stress reaction 05/10/2016   Estrogen deficiency 01/29/2015   Routine general medical examination at a health care facility 02/20/2012   GERD 06/20/2008   Past Medical History:  Diagnosis Date   Depression    Fatty liver    Fibrocystic disease of both breasts    GERD (gastroesophageal reflux disease)    Hyperglycemia    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   On continue HRT since 1980 per patient   Sphincter of Oddi dysfunction    Past Surgical History:  Procedure Laterality Date   ABDOMINAL HYSTERECTOMY     CARPAL TUNNEL RELEASE  08/2008   left   ERCP     LEFT HEART CATHETERIZATION WITH CORONARY ANGIOGRAM N/A 06/08/2013   Procedure: LEFT HEART CATHETERIZATION WITH CORONARY ANGIOGRAM;  Surgeon: Ramond Dial, MD;  Location: [facility] CATH LAB;  Service: Cardiovascular;  Laterality: N/A;   MASTECTOMY     bilateral for severe macrocystic breast   SPINE SURGERY N/A  02/12/2020   neck   Social History   Tobacco Use   Smoking status: Never   Smokeless tobacco: Never  Vaping Use   Vaping Use: Never used  Substance Use Topics   Alcohol use: Not Currently    Alcohol/week: 0.0 standard drinks    Comment: occ   Drug use: No   Family History  Problem Relation Age of Onset   Hypertension Father    Cancer Father        prostate and leukemia   Cancer Maternal Aunt        Pancreatic   Diabetes Maternal Uncle    Heart disease Maternal Grandmother    Brain cancer Mother        hospice   Breast cancer Sister 101   Cancer Sister    Allergies  Allergen Reactions   Atorvastatin     REACTION: rash   Ezetimibe     REACTION: muscle aches   Prednisone  Other (See Comments)    **INJECTIONS ONLY** caused tachycardia    Sulfamethoxazole-Trimethoprim Other (See Comments)    Swelling of nostrils   Current Outpatient Medications on File Prior to Visit  Medication Sig Dispense Refill   acetaminophen (TYLENOL) 500 MG tablet Take 500 mg by mouth at bedtime.     ALPRAZolam (XANAX) 1 MG tablet TAKE 1/2 TABLET BY MOUTH AT BEDTIME AS NEEDED FOR ANXIETY OR SLEEP 15 tablet 3   ASPIRIN PO Take 81 mg by mouth daily.     Calcium-Magnesium-Vitamin D (CALCIUM 1200+D3 PO) Take 1 tablet by mouth at bedtime.     diclofenac Sodium (VOLTAREN) 1 % GEL APPLY 4 GRAMS TO AFFECTED AREA 4 TIMES DAILY 200 g 3   MELATONIN PO Take 10 mg by mouth daily.     methocarbamol (ROBAXIN) 750 MG tablet Take 750 mg by mouth at bedtime.     Multiple Vitamins-Minerals (EYE VITAMINS PO) Take 1 capsule by mouth daily.     Omega-3 Fatty Acids (OMEGA-3 FISH OIL PO) Take 1,000 mg by mouth daily.     pantoprazole (PROTONIX) 40 MG tablet Take 1 tablet (40 mg total) by mouth 2 (two) times daily. 180 tablet 3   No current facility-administered medications on file prior to visit.     Review of Systems  Constitutional:  Negative for activity change, appetite change, fatigue, fever and unexpected weight change.  HENT:  Negative for congestion, ear pain, rhinorrhea, sinus pressure and sore throat.   Eyes:  Negative for pain, redness and visual disturbance.  Respiratory:  Negative for cough, shortness of breath and wheezing.   Cardiovascular:  Negative for chest pain and palpitations.  Gastrointestinal:  Negative for abdominal pain, blood in stool, constipation and diarrhea.  Endocrine: Negative for polydipsia and polyuria.  Genitourinary:  Negative for dysuria, frequency and urgency.  Musculoskeletal:  Positive for arthralgias. Negative for back pain and myalgias.       Foot pain   Skin:  Negative for pallor and rash.  Allergic/Immunologic: Negative for environmental allergies.   Neurological:  Negative for dizziness, syncope and headaches.  Hematological:  Negative for adenopathy. Does not bruise/bleed easily.  Psychiatric/Behavioral:  Negative for decreased concentration and dysphoric mood. The patient is not nervous/anxious.       Objective:   Physical Exam Constitutional:      General: She is not in acute distress.    Appearance: Normal appearance. She is well-developed.     Comments: Overweight   HENT:     Head: Normocephalic and  atraumatic.  Eyes:     Conjunctiva/sclera: Conjunctivae normal.     Pupils: Pupils are equal, round, and reactive to light.  Neck:     Thyroid: No thyromegaly.     Vascular: No carotid bruit or JVD.  Cardiovascular:     Rate and Rhythm: Normal rate and regular rhythm.     Pulses: Normal pulses.     Heart sounds: Normal heart sounds.    No gallop.  Pulmonary:     Effort: Pulmonary effort is normal. No respiratory distress.     Breath sounds: Normal breath sounds. No wheezing or rales.  Abdominal:     General: Bowel sounds are normal. There is no distension or abdominal bruit.     Palpations: Abdomen is soft. There is no mass.     Tenderness: There is no abdominal tenderness.  Musculoskeletal:     Cervical back: Normal range of motion and neck supple.     Right lower leg: No edema.     Left lower leg: No edema.     Comments: Bump on back of L heel    Lymphadenopathy:     Cervical: No cervical adenopathy.  Skin:    General: Skin is warm and dry.     Coloration: Skin is not pale.     Findings: No rash.     Comments: tanned  Neurological:     Mental Status: She is alert.     Coordination: Coordination normal.     Deep Tendon Reflexes: Reflexes are normal and symmetric. Reflexes normal.  Psychiatric:        Mood and Affect: Mood normal.        Cognition and Memory: Cognition and memory normal.          Assessment & Plan:   Problem List Items Addressed This Visit       Endocrine   Subclinical  hypothyroidism    Lab Results  Component Value Date   TSH 2.45 07/11/2020  Continue to watch  No clinical changes Still has hot flashes  Neg endo w/u for that         Other   Prediabetes - Primary    A1C is down from 6.5 to 6.0  Commended Very active- building patio  Disc low glycemic diet  Will continue to follow F/u in november      Foot pain, bilateral    More trouble recently  Needs foot surgery - L foot/heel is worst  Encouraged use of voltaren gel instead of oral ibup due to GI intolerance  Will f/u with ortho -plans to get foot surgery

## 2021-04-20 NOTE — Assessment & Plan Note (Signed)
A1C is down from 6.5 to 6.0  Commended Very active- building patio  Disc low glycemic diet  Will continue to follow F/u in november

## 2021-04-29 DIAGNOSIS — M79671 Pain in right foot: Secondary | ICD-10-CM | POA: Diagnosis not present

## 2021-05-06 ENCOUNTER — Other Ambulatory Visit: Payer: Self-pay | Admitting: Family Medicine

## 2021-05-17 ENCOUNTER — Other Ambulatory Visit: Payer: Self-pay | Admitting: Family Medicine

## 2021-05-18 NOTE — Telephone Encounter (Signed)
CPE scheduled 07/20/21, last filled on 01/27/21 #200 g with 3 refills

## 2021-05-20 DIAGNOSIS — M79671 Pain in right foot: Secondary | ICD-10-CM | POA: Diagnosis not present

## 2021-07-07 ENCOUNTER — Ambulatory Visit: Payer: Medicare Other

## 2021-07-13 ENCOUNTER — Other Ambulatory Visit: Payer: Medicare Other

## 2021-07-13 ENCOUNTER — Encounter: Payer: Medicare Other | Admitting: Family Medicine

## 2021-07-13 NOTE — Progress Notes (Signed)
Subjective:   [name] is a 70 y.o. female who presents for Medicare Annual (Subsequent) preventive examination.  I connected with Silver Huguenin today by telephone and verified that I am speaking with the correct person using two identifiers. Location patient: home Location provider: work Persons participating in the virtual visit: patient, Marine scientist.    I discussed the limitations, risks, security and privacy concerns of performing an evaluation and management service by telephone and the availability of in person appointments. I also discussed with the patient that there may be a patient responsible charge related to this service. The patient expressed understanding and verbally consented to this telephonic visit.    Interactive audio and video telecommunications were attempted between this provider and patient, however failed, due to patient having technical difficulties OR patient did not have access to video capability.  We continued and completed visit with audio only.  Some vital signs may be absent or patient reported.   Time Spent with patient on telephone encounter: 25 minutes  Review of Systems     Cardiac Risk Factors include: advanced age (>43men, >33 women)     Objective:    Today's Vitals   07/16/21 1028  Weight: 168 lb (76.2 kg)  Height: 5\' 4"  (1.626 m)   Body mass index is 28.84 kg/m.  Advanced Directives 07/16/2021 07/04/2020 06/06/2018 05/26/2017 05/10/2016 06/08/2013  Does Patient Have a Medical Advance Directive? Yes Yes Yes Yes Yes Patient has advance directive, copy not in chart  Type of Advance Directive [place];Living will [place];Living will [place];Living will [place];Living will Living will Living will  Does patient want to make changes to medical advance directive? Yes (MAU/Ambulatory/Procedural Areas - Information given) - - - No - Patient declined -  Copy of  [place] in Chart? - No - copy requested No - copy requested No - copy requested No - copy requested -  Pre-existing out of facility DNR order (yellow form or pink MOST form) - - - - - No    Current Medications (verified) Outpatient Encounter Medications as of 07/16/2021  Medication Sig   acetaminophen (TYLENOL) 500 MG tablet Take 500 mg by mouth at bedtime.   ALPRAZolam (XANAX) 1 MG tablet TAKE 1/2 TABLET BY MOUTH AT BEDTIME AS NEEDED FOR ANXIETY OR SLEEP   ASPIRIN PO Take 81 mg by mouth daily.   Calcium-Magnesium-Vitamin D (CALCIUM 1200+D3 PO) Take 1 tablet by mouth at bedtime.   diclofenac Sodium (VOLTAREN) 1 % GEL APPLY 4 GRAMS TO AFFECTED AREA 4 TIMES DAILY   MELATONIN PO Take 10 mg by mouth daily.   methocarbamol (ROBAXIN) 750 MG tablet Take 750 mg by mouth at bedtime.   Multiple Vitamins-Minerals (EYE VITAMINS PO) Take 1 capsule by mouth daily.   Omega-3 Fatty Acids (OMEGA-3 FISH OIL PO) Take 1,000 mg by mouth daily.   pantoprazole (PROTONIX) 40 MG tablet TAKE 1 TABLET BY MOUTH  TWICE DAILY   No facility-administered encounter medications on file as of 07/16/2021.    Allergies (verified) Atorvastatin, Ezetimibe, Prednisone, and Sulfamethoxazole-trimethoprim   History: Past Medical History:  Diagnosis Date   Depression    Fatty liver    Fibrocystic disease of both breasts    GERD (gastroesophageal reflux disease)    Hyperglycemia    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   On continue HRT since 1980 per patient   Sphincter of Oddi dysfunction    Past Surgical  History:  Procedure Laterality Date   ABDOMINAL HYSTERECTOMY     CARPAL TUNNEL RELEASE  08/2008   left   ERCP     LEFT HEART CATHETERIZATION WITH CORONARY ANGIOGRAM N/A 06/08/2013   Procedure: LEFT HEART CATHETERIZATION WITH CORONARY ANGIOGRAM;  Surgeon: Ramond Dial, MD;  Location[facility] CATH LAB;  Service: Cardiovascular;  Laterality: N/A;   MASTECTOMY     bilateral for severe  macrocystic breast   SPINE SURGERY N/A 02/12/2020   neck   Family History  Problem Relation Age of Onset   Brain cancer Mother        hospice   Diabetes Father    Hypertension Father    Cancer Father        prostate and leukemia   Breast cancer Sister 74   Cancer Sister    Cancer Maternal Aunt        Pancreatic   Diabetes Maternal Uncle    Heart disease Maternal Grandmother    Social History   Socioeconomic History   Marital status: Married    Spouse name: Not on file   Number of children: Not on file   Years of education: Not on file   Highest education level: Not on file  Occupational History   Not on file  Tobacco Use   Smoking status: Never   Smokeless tobacco: Never  Vaping Use   Vaping Use: Never used  Substance and Sexual Activity   Alcohol use: Yes    Comment: once every 2 weeks   Drug use: No   Sexual activity: Yes    Birth control/protection: Surgical  Other Topics Concern   Not on file  Social History Narrative   Not on file   Social Determinants of Health   Financial Resource Strain: Low Risk    Difficulty of Paying Living Expenses: Not hard at all  Food Insecurity: No Food Insecurity   Worried About Charity fundraiser in the Last Year: Never true   [place] in the Last Year: Never true  Transportation Needs: No Transportation Needs   Lack of Transportation (Medical): No   Lack of Transportation (Non-Medical): No  Physical Activity: Sufficiently Active   Days of Exercise per Week: 7 days   Minutes of Exercise per Session: 30 min  Stress: No Stress Concern Present   Feeling of Stress : Not at all  Social Connections: Moderately Integrated   Frequency of Communication with Friends and Family: Three times a week   Frequency of Social Gatherings with Friends and Family: Three times a week   Attends Religious Services: More than 4 times per year   Active Member of Clubs or Organizations: No   Attends Archivist Meetings: Never    Marital Status: Married    Tobacco Counseling Counseling given: Not Answered   Clinical Intake:  Pre-visit preparation completed: Yes  Pain : No/denies pain     BMI - recorded: 28.84 Nutritional Status: BMI 25 -29 Overweight Nutritional Risks: None Diabetes: No  How often do you need to have someone help you when you read instructions, pamphlets, or other written materials from your doctor or pharmacy?: 1 - Never  Diabetic: No, patient is prediabetic   Diabetic Exams:  Diabetic Eye Exam: Completed 12/04/20.   Diabetic Foot Exam: Completed 07/11/21.    Interpreter Needed?: No  Information entered by :: Orrin Brigham LPN   Activities of Daily Living In your present state of health, do you have any  difficulty performing the following activities: 07/16/2021  Hearing? Y  Vision? N  Difficulty concentrating or making decisions? N  Walking or climbing stairs? N  Dressing or bathing? N  Doing errands, shopping? N  Preparing Food and eating ? N  Using the Toilet? N  In the past six months, have you accidently leaked urine? N  Do you have problems with loss of bowel control? N  Managing your Medications? N  Managing your Finances? N  Housekeeping or managing your Housekeeping? N  Some recent data might be hidden    Patient Care Team: Tower, Wynelle Fanny, MD as PCP - General Ernestina Patches Juanita Craver, MD as Consulting Physician (Obstetrics and Gynecology) Sharyne Peach, MD as Consulting Physician (Ophthalmology)  Indicate any recent Medical Services you may have received from other than [facility] providers in the past year (date may be approximate).     Assessment:   This is a routine wellness examination for [place].  Hearing/Vision screen Hearing Screening - Comments:: Some decrease in hearing in the left ear Vision Screening - Comments:: Last exam 12/04/20, Dr Delman Cheadle, wears glasses   Dietary issues and exercise activities discussed: Current Exercise Habits: Home  exercise routine, Type of exercise: walking, Time (Minutes): 30, Frequency (Times/Week): 7, Weekly Exercise (Minutes/Week): 210, Intensity: Mild   Goals Addressed             This Visit's Progress    Patient Stated       Would like to maintain walking routine.        Depression Screen PHQ 2/9 Scores 07/04/2020 07/10/2019 06/06/2018 05/26/2017 05/10/2016 01/29/2015  PHQ - 2 Score 0 0 0 1 0 0  PHQ- 9 Score 0 - 5 2 - -    Fall Risk Fall Risk  07/16/2021 07/04/2020 07/10/2019 06/06/2018 05/26/2017  Falls in the past year? 1 0 1 No Yes  Comment - - - - pt tripped over box  Number falls in past yr: 0 0 0 - 1  Injury with Fall? 0 0 0 - No  Risk for fall due to : Other (Comment) Medication side effect - - -  Risk for fall due to: Comment fell in a hole in the back yard - - - -  Follow up Falls prevention discussed Falls evaluation completed;Falls prevention discussed Falls evaluation completed - -    FALL RISK PREVENTION PERTAINING TO THE HOME:  Any stairs in or around the home? Yes  If so, are there any without handrails? Yes  Home free of loose throw rugs in walkways, pet beds, electrical cords, etc? Yes  Adequate lighting in your home to reduce risk of falls? Yes   ASSISTIVE DEVICES UTILIZED TO PREVENT FALLS:  Life alert? No  Use of a cane, walker or w/c? No  Grab bars in the bathroom? No  Shower chair or bench in shower? No  Elevated toilet seat or a handicapped toilet? Yes   TIMED UP AND GO:  Was the test performed? No , visit completed over the phone.   Cognitive Function: Normal cognitive status assessed by this Nurse Health Advisor. No abnormalities found.    MMSE - Mini Mental State Exam 07/04/2020 06/06/2018 05/10/2016  Orientation to time 5 5 5   Orientation to Place 5 5 5   Registration 3 3 3   Attention/ Calculation 5 0 0  Recall 3 3 3   Language- name 2 objects - 0 0  Language- repeat 1 1 1   Language- follow 3 step command - 3 3  Language-  read & follow direction  - 0 0  Write a sentence - 0 0  Copy design - 0 0  Total score - 20 20        Immunizations Immunization History  Administered Date(s) Administered   Fluad Quad(high Dose 65+) 06/18/2019, 06/12/2020   Influenza Whole 05/30/2008   Influenza, High Dose Seasonal PF 06/03/2021   Influenza,inj,Quad PF,6+ Mos 05/03/2014, 06/06/2015, 05/10/2016, 05/26/2017, 06/09/2018   Moderna SARS-COV2 Booster Vaccination 12/03/2020   PFIZER(Purple Top)SARS-COV-2 Vaccination 10/05/2019, 10/30/2019, 06/05/2020   Pfizer Covid-19 Vaccine Bivalent Booster 3yrs & up 06/03/2021   Pneumococcal Conjugate-13 01/29/2015   Pneumococcal Polysaccharide-23 05/10/2016   Td 08/30/2002, 04/27/2019   Tdap 05/03/2014   Zoster, Live 09/07/2012    TDAP status: Up to date  Flu Vaccine status: Up to date  Pneumococcal vaccine status: Up to date  Covid-19 vaccine status: Completed vaccines  Qualifies for Shingles Vaccine? Yes   Zostavax completed Yes   Shingrix Completed?: No.    Education has been provided regarding the importance of this vaccine. Patient has been advised to call insurance company to determine out of pocket expense if they have not yet received this vaccine. Advised may also receive vaccine at local pharmacy or Health Dept. Verbalized acceptance and understanding.  Screening Tests Health Maintenance  Topic Date Due   Zoster Vaccines- Shingrix (1 of 2) Never done   FOOT EXAM  07/11/2021   HEMOGLOBIN A1C  10/21/2021   OPHTHALMOLOGY EXAM  12/04/2021   MAMMOGRAM  10/01/2022   COLONOSCOPY (Pts 45-22yrs Insurance coverage will need to be confirmed)  08/11/2023   TETANUS/TDAP  04/26/2029   Pneumonia Vaccine 21+ Years old  Completed   INFLUENZA VACCINE  Completed   DEXA SCAN  Completed   COVID-19 Vaccine  Completed   Hepatitis C Screening  Completed   HPV VACCINES  Aged Out    Health Maintenance  Health Maintenance Due  Topic Date Due   Zoster Vaccines- Shingrix (1 of 2) Never done   FOOT  EXAM  07/11/2021    Colorectal cancer screening: Type of screening: Colonoscopy. Completed 08/10/13. Repeat every 10 years  Mammogram status: Completed 10/01/20. Repeat every year  Bone Density status: due, last exam 06/19/18, patient plans on discussing with PCP.  Lung Cancer Screening: (Low Dose CT Chest recommended if Age 29-80 years, 30 pack-year currently smoking OR have quit w/in 15years.) does not qualify.     Additional Screening:  Hepatitis C Screening: does qualify; Completed 09/28/06  Vision Screening: Recommended annual ophthalmology exams for early detection of glaucoma and other disorders of the eye. Is the patient up to date with their annual eye exam?  Yes  Who is the provider or what is the name of the office in which the patient attends annual eye exams? Dr. Delman Cheadle   Dental Screening: Recommended annual dental exams for proper oral hygiene  Community Resource Referral / Chronic Care Management: CRR required this visit?  No   CCM required this visit?  No      Plan:     I have personally reviewed and noted the following in the patient's chart:   Medical and social history Use of alcohol, tobacco or illicit drugs  Current medications and supplements including opioid prescriptions.  Functional ability and status Nutritional status Physical activity Advanced directives List of other physicians Hospitalizations, surgeries, and ER visits in previous 12 months Vitals Screenings to include cognitive, depression, and falls Referrals and appointments  In addition, I have reviewed and discussed with  patient certain preventive protocols, quality metrics, and best practice recommendations. A written personalized care plan for preventive services as well as general preventive health recommendations were provided to patient.   Due to this being a telephonic visit, the after visit summary with patients personalized plan was offered to patient via mail or my-chart.  Patient would like to access on my-chart.   Loma Messing, LPN  [id number]   Nurse Health Advisor  Nurse Notes: None

## 2021-07-14 ENCOUNTER — Telehealth: Payer: Self-pay | Admitting: Family Medicine

## 2021-07-14 DIAGNOSIS — E038 Other specified hypothyroidism: Secondary | ICD-10-CM

## 2021-07-14 DIAGNOSIS — Z0000 Encounter for general adult medical examination without abnormal findings: Secondary | ICD-10-CM

## 2021-07-14 DIAGNOSIS — R7303 Prediabetes: Secondary | ICD-10-CM

## 2021-07-14 NOTE — Telephone Encounter (Signed)
-----   Message from Ellamae Sia sent at 07/03/2021  3:16 PM EDT ----- Regarding: Lab orders for Wednesday, 11.16.22 Patient is scheduled for CPX labs, please order future labs, Thanks , Karna Christmas

## 2021-07-15 ENCOUNTER — Other Ambulatory Visit: Payer: Medicare Other

## 2021-07-16 ENCOUNTER — Ambulatory Visit (INDEPENDENT_AMBULATORY_CARE_PROVIDER_SITE_OTHER): Payer: Medicare Other

## 2021-07-16 VITALS — Ht 64.0 in | Wt 168.0 lb

## 2021-07-16 DIAGNOSIS — Z0000 Encounter for general adult medical examination without abnormal findings: Secondary | ICD-10-CM

## 2021-07-16 NOTE — Patient Instructions (Signed)
[name] , Thank you for taking time to come for your Medicare Wellness Visit. I appreciate your ongoing commitment to your health goals. Please review the following plan we discussed and let me know if I can assist you in the future.   Screening recommendations/referrals: Colonoscopy: up to date, completed 08/10/13, due 08/11/23 Mammogram: up to date, completed 10/21/20, due 10/21/21 Bone Density: due, last exam 06/19/18, will discuss with PCP at your next visit Recommended yearly ophthalmology/optometry visit for glaucoma screening and checkup Recommended yearly dental visit for hygiene and checkup  Vaccinations: Influenza vaccine: up to date Pneumococcal vaccine: up to date Tdap vaccine: up to date, completed 04/27/19, due 04/26/29 Shingles vaccine: Discuss with your local pharmacy Covid-19:up to date  Advanced directives: Please bring a copy of Living Will and/or Healthcare Power of Attorney for your chart.   Conditions/risks identified: see problem list  Next appointment: Follow up in one year for your annual wellness visit 07/21/22 @ 10:00am, this will be a telephone visit.    Preventive Care 8 Years and Older, Female Preventive care refers to lifestyle choices and visits with your health care provider that can promote health and wellness. What does preventive care include? A yearly physical exam. This is also called an annual well check. Dental exams once or twice a year. Routine eye exams. Ask your health care provider how often you should have your eyes checked. Personal lifestyle choices, including: Daily care of your teeth and gums. Regular physical activity. Eating a healthy diet. Avoiding tobacco and drug use. Limiting alcohol use. Practicing safe sex. Taking low-dose aspirin every day. Taking vitamin and mineral supplements as recommended by your health care provider. What happens during an annual well check? The services and screenings done by your health care  provider during your annual well check will depend on your age, overall health, lifestyle risk factors, and family history of disease. Counseling  Your health care provider may ask you questions about your: Alcohol use. Tobacco use. Drug use. Emotional well-being. Home and relationship well-being. Sexual activity. Eating habits. History of falls. Memory and ability to understand (cognition). Work and work Statistician. Reproductive health. Screening  You may have the following tests or measurements: Height, weight, and BMI. Blood pressure. Lipid and cholesterol levels. These may be checked every 5 years, or more frequently if you are over 48 years old. Skin check. Lung cancer screening. You may have this screening every year starting at age 7 if you have a 30-pack-year history of smoking and currently smoke or have quit within the past 15 years. Fecal occult blood test (FOBT) of the stool. You may have this test every year starting at age 6. Flexible sigmoidoscopy or colonoscopy. You may have a sigmoidoscopy every 5 years or a colonoscopy every 10 years starting at age 35. Hepatitis C blood test. Hepatitis B blood test. Sexually transmitted disease (STD) testing. Diabetes screening. This is done by checking your blood sugar (glucose) after you have not eaten for a while (fasting). You may have this done every 1-3 years. Bone density scan. This is done to screen for osteoporosis. You may have this done starting at age 28. Mammogram. This may be done every 1-2 years. Talk to your health care provider about how often you should have regular mammograms. Talk with your health care provider about your test results, treatment options, and if necessary, the need for more tests. Vaccines  Your health care provider may recommend certain vaccines, such as: Influenza vaccine. This is recommended  every year. Tetanus, diphtheria, and acellular pertussis (Tdap, Td) vaccine. You may need a Td  booster every 10 years. Zoster vaccine. You may need this after age 71. Pneumococcal 13-valent conjugate (PCV13) vaccine. One dose is recommended after age 60. Pneumococcal polysaccharide (PPSV23) vaccine. One dose is recommended after age 79. Talk to your health care provider about which screenings and vaccines you need and how often you need them. This information is not intended to replace advice given to you by your health care provider. Make sure you discuss any questions you have with your health care provider. Document Released: 09/12/2015 Document Revised: 05/05/2016 Document Reviewed: 06/17/2015 Elsevier Interactive Patient Education  2017 [place] Prevention in the Home Falls can cause injuries. They can happen to people of all ages. There are many things you can do to make your home safe and to help prevent falls. What can I do on the outside of my home? Regularly fix the edges of walkways and driveways and fix any cracks. Remove anything that might make you trip as you walk through a door, such as a raised step or threshold. Trim any bushes or trees on the path to your home. Use bright outdoor lighting. Clear any walking paths of anything that might make someone trip, such as rocks or tools. Regularly check to see if handrails are loose or broken. Make sure that both sides of any steps have handrails. Any raised decks and porches should have guardrails on the edges. Have any leaves, snow, or ice cleared regularly. Use sand or salt on walking paths during winter. Clean up any spills in your garage right away. This includes oil or grease spills. What can I do in the bathroom? Use night lights. Install grab bars by the toilet and in the tub and shower. Do not use towel bars as grab bars. Use non-skid mats or decals in the tub or shower. If you need to sit down in the shower, use a plastic, non-slip stool. Keep the floor dry. Clean up any water that spills on the floor  as soon as it happens. Remove soap buildup in the tub or shower regularly. Attach bath mats securely with double-sided non-slip rug tape. Do not have throw rugs and other things on the floor that can make you trip. What can I do in the bedroom? Use night lights. Make sure that you have a light by your bed that is easy to reach. Do not use any sheets or blankets that are too big for your bed. They should not hang down onto the floor. Have a firm chair that has side arms. You can use this for support while you get dressed. Do not have throw rugs and other things on the floor that can make you trip. What can I do in the kitchen? Clean up any spills right away. Avoid walking on wet floors. Keep items that you use a lot in easy-to-reach places. If you need to reach something above you, use a strong step stool that has a grab bar. Keep electrical cords out of the way. Do not use floor polish or wax that makes floors slippery. If you must use wax, use non-skid floor wax. Do not have throw rugs and other things on the floor that can make you trip. What can I do with my stairs? Do not leave any items on the stairs. Make sure that there are handrails on both sides of the stairs and use them. Fix handrails that are broken  or loose. Make sure that handrails are as long as the stairways. Check any carpeting to make sure that it is firmly attached to the stairs. Fix any carpet that is loose or worn. Avoid having throw rugs at the top or bottom of the stairs. If you do have throw rugs, attach them to the floor with carpet tape. Make sure that you have a light switch at the top of the stairs and the bottom of the stairs. If you do not have them, ask someone to add them for you. What else can I do to help prevent falls? Wear shoes that: Do not have high heels. Have rubber bottoms. Are comfortable and fit you well. Are closed at the toe. Do not wear sandals. If you use a stepladder: Make sure that it is  fully opened. Do not climb a closed stepladder. Make sure that both sides of the stepladder are locked into place. Ask someone to hold it for you, if possible. Clearly mark and make sure that you can see: Any grab bars or handrails. First and last steps. Where the edge of each step is. Use tools that help you move around (mobility aids) if they are needed. These include: Canes. Walkers. Scooters. Crutches. Turn on the lights when you go into a dark area. Replace any light bulbs as soon as they burn out. Set up your furniture so you have a clear path. Avoid moving your furniture around. If any of your floors are uneven, fix them. If there are any pets around you, be aware of where they are. Review your medicines with your doctor. Some medicines can make you feel dizzy. This can increase your chance of falling. Ask your doctor what other things that you can do to help prevent falls. This information is not intended to replace advice given to you by your health care provider. Make sure you discuss any questions you have with your health care provider. Document Released: 06/12/2009 Document Revised: 01/22/2016 Document Reviewed: 09/20/2014 Elsevier Interactive Patient Education  2017 [employer].

## 2021-07-20 ENCOUNTER — Encounter: Payer: Self-pay | Admitting: Family Medicine

## 2021-07-20 ENCOUNTER — Ambulatory Visit (INDEPENDENT_AMBULATORY_CARE_PROVIDER_SITE_OTHER): Payer: Medicare Other | Admitting: Family Medicine

## 2021-07-20 ENCOUNTER — Other Ambulatory Visit: Payer: Self-pay

## 2021-07-20 VITALS — BP 131/80 | HR 71 | Temp 98.0°F | Ht 64.0 in | Wt 168.5 lb

## 2021-07-20 DIAGNOSIS — Z1211 Encounter for screening for malignant neoplasm of colon: Secondary | ICD-10-CM | POA: Diagnosis not present

## 2021-07-20 DIAGNOSIS — E038 Other specified hypothyroidism: Secondary | ICD-10-CM | POA: Diagnosis not present

## 2021-07-20 DIAGNOSIS — R7303 Prediabetes: Secondary | ICD-10-CM

## 2021-07-20 DIAGNOSIS — Z0000 Encounter for general adult medical examination without abnormal findings: Secondary | ICD-10-CM

## 2021-07-20 DIAGNOSIS — R21 Rash and other nonspecific skin eruption: Secondary | ICD-10-CM | POA: Insufficient documentation

## 2021-07-20 DIAGNOSIS — Z79899 Other long term (current) drug therapy: Secondary | ICD-10-CM | POA: Diagnosis not present

## 2021-07-20 LAB — COMPREHENSIVE METABOLIC PANEL
ALT: 29 U/L (ref 0–35)
AST: 20 U/L (ref 0–37)
Albumin: 4.9 g/dL (ref 3.5–5.2)
Alkaline Phosphatase: 59 U/L (ref 39–117)
BUN: 17 mg/dL (ref 6–23)
CO2: 30 mEq/L (ref 19–32)
Calcium: 9.9 mg/dL (ref 8.4–10.5)
Chloride: 102 mEq/L (ref 96–112)
Creatinine, Ser: 0.7 mg/dL (ref 0.40–1.20)
GFR: 86.75 mL/min (ref >60.00)
Glucose, Bld: 104 mg/dL — ABNORMAL HIGH (ref 70–99)
Potassium: 4.3 mEq/L (ref 3.5–5.1)
Sodium: 140 mEq/L (ref 135–145)
Total Bilirubin: 1 mg/dL (ref 0.2–1.2)
Total Protein: 7.4 g/dL (ref 6.0–8.3)

## 2021-07-20 LAB — CBC WITH DIFFERENTIAL/PLATELET
Basophils Absolute: 0 10*3/uL (ref 0.0–0.1)
Basophils Relative: 0.3 % (ref 0.0–3.0)
Eosinophils Absolute: 0.2 10*3/uL (ref 0.0–0.7)
Eosinophils Relative: 2.7 % (ref 0.0–5.0)
HCT: 41.6 % (ref 36.0–46.0)
Hemoglobin: 13.6 g/dL (ref 12.0–15.0)
Lymphocytes Relative: 41 % (ref 12.0–46.0)
Lymphs Abs: 2.5 10*3/uL (ref 0.7–4.0)
MCHC: 32.8 g/dL (ref 30.0–36.0)
MCV: 86.1 fl (ref 78.0–100.0)
Monocytes Absolute: 0.5 10*3/uL (ref 0.1–1.0)
Monocytes Relative: 9.1 % (ref 3.0–12.0)
Neutro Abs: 2.8 10*3/uL (ref 1.4–7.7)
Neutrophils Relative %: 46.9 % (ref 43.0–77.0)
Platelets: 265 10*3/uL (ref 150.0–400.0)
RBC: 4.83 Mil/uL (ref 3.87–5.11)
RDW: 13.6 % (ref 11.5–15.5)
WBC: 6 10*3/uL (ref 4.0–10.5)

## 2021-07-20 LAB — LIPID PANEL
Cholesterol: 213 mg/dL — ABNORMAL HIGH (ref 0–200)
HDL: 69.9 mg/dL (ref >39.00)
LDL Cholesterol: 112 mg/dL — ABNORMAL HIGH (ref 0–99)
NonHDL: 143.15
Total CHOL/HDL Ratio: 3
Triglycerides: 158 mg/dL — ABNORMAL HIGH (ref 0.0–149.0)
VLDL: 31.6 mg/dL (ref 0.0–40.0)

## 2021-07-20 LAB — VITAMIN B12: Vitamin B-12: 380 pg/mL (ref 211–911)

## 2021-07-20 LAB — TSH: TSH: 2.71 u[IU]/mL (ref 0.35–5.50)

## 2021-07-20 LAB — HEMOGLOBIN A1C: Hgb A1c MFr Bld: 6.4 % (ref 4.6–6.5)

## 2021-07-20 LAB — T4, FREE: Free T4: 0.77 ng/dL (ref 0.60–1.60)

## 2021-07-20 MED ORDER — TRIAMCINOLONE ACETONIDE 0.1 % EX CREA: 1.0000 "application " | TOPICAL_CREAM | Freq: Two times a day (BID) | CUTANEOUS | 0 refills | Status: DC | PRN

## 2021-07-20 MED ORDER — PANTOPRAZOLE SODIUM 40 MG PO TBEC: 40.0000 mg | DELAYED_RELEASE_TABLET | Freq: Two times a day (BID) | ORAL | 3 refills | Status: DC

## 2021-07-20 NOTE — Assessment & Plan Note (Signed)
Colonoscopy 2014 with 10 y recall  cologuard ordered

## 2021-07-20 NOTE — Progress Notes (Signed)
Subjective:    Patient ID: [name], female    DOB: 1951-05-15, 70 y.o.   MRN: [id number]  This visit occurred during the SARS-CoV-2 public health emergency.  Safety protocols were in place, including screening questions prior to the visit, additional usage of staff PPE, and extensive cleaning of exam room while observing appropriate contact time as indicated for disinfecting solutions.   HPI Here for health maintenance exam and to review chronic medical problems   Wt Readings from Last 3 Encounters:  07/20/21 168 lb 8 oz (76.4 kg)  07/16/21 168 lb (76.2 kg)  04/20/21 167 lb 9 oz (76 kg)   28.92 kg/m  Doing ok  Has a foot surgery planned-bump on L heel  She walks 30 minutes per day   10-15000 steps daily  Wants to keep up with that   Has had a little rash both arms  Forearms/around elbow  Used aspercreme with lidocaine   Uses voltaren gel for aches and pains = helps joints    Had amw on 11/17  Zoster status : plans to shingrix  Flu shot last mo  Covid immunized  Mammogram 2/22 Had mastectomy (almost total) due to macrocystic dz -10% left Sister had breast cancer at 4 Self breast exam -no change   Colonoscopy 2014 Interested in cologuard    Dexa 10/19 -normal [place]- fell in hole in backyard, then another  Fractures - walking  Supplements -ca and D  PHQ:0   BP Readings from Last 3 Encounters:  07/20/21 131/80  04/20/21 132/80  02/12/21 132/74    Pulse Readings from Last 3 Encounters:  07/20/21 71  04/20/21 67  02/12/21 84      H/o subclinical hypothyroidism Lab Results  Component Value Date   TSH 2.45 07/11/2020   Due for labs   Cholesterol Lab Results  Component Value Date   CHOL 209 (H) 07/04/2020   HDL 69.30 07/04/2020   LDLCALC 107 (H) 07/04/2020   LDLDIRECT 97.0 06/06/2018   TRIG 163.0 (H) 07/04/2020   CHOLHDL 3 07/04/2020    Prediabetes Lab Results  Component Value Date   HGBA1C 6.0 (A) 04/20/2021   Takes  ppi Lab Results  Component Value Date   VITAMINB12 357 04/06/2019    Patient Active Problem List   Diagnosis Date Noted   Current use of proton pump inhibitor 07/20/2021   Rash 07/20/2021   Colon cancer screening 07/20/2021   Hot flashes 07/11/2020   Subclinical hypothyroidism 07/11/2020   Allergy to statin medication 07/11/2020   Medicare annual wellness visit, subsequent 07/10/2019   Paresthesia 04/06/2019   Foot pain, bilateral 04/06/2019   Vaginal atrophy 06/09/2018   Breast cancer screening 05/31/2017   Prediabetes 06/02/2016   Estrogen deficiency 01/29/2015   Routine general medical examination at a health care facility 02/20/2012   GERD 06/20/2008   Past Medical History:  Diagnosis Date   Depression    Fatty liver    Fibrocystic disease of both breasts    GERD (gastroesophageal reflux disease)    Hyperglycemia    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   On continue HRT since 1980 per patient   Sphincter of Oddi dysfunction    Past Surgical History:  Procedure Laterality Date   ABDOMINAL HYSTERECTOMY     CARPAL TUNNEL RELEASE  08/2008   left   ERCP     LEFT HEART CATHETERIZATION WITH CORONARY ANGIOGRAM N/A 06/08/2013   Procedure: LEFT HEART CATHETERIZATION WITH CORONARY ANGIOGRAM;  Surgeon: Brooke Bonito  Thayer Headings, MD;  Location: [facility] CATH LAB;  Service: Cardiovascular;  Laterality: N/A;   MASTECTOMY     bilateral for severe macrocystic breast   SPINE SURGERY N/A 02/12/2020   neck   Social History   Tobacco Use   Smoking status: Never   Smokeless tobacco: Never  Vaping Use   Vaping Use: Never used  Substance Use Topics   Alcohol use: Yes    Comment: once every 2 weeks   Drug use: No   Family History  Problem Relation Age of Onset   Brain cancer Mother        hospice   Diabetes Father    Hypertension Father    Cancer Father        prostate and leukemia   Breast cancer Sister 29   Cancer Sister    Cancer Maternal Aunt        Pancreatic    Diabetes Maternal Uncle    Heart disease Maternal Grandmother    Allergies  Allergen Reactions   Atorvastatin     REACTION: rash   Ezetimibe     REACTION: muscle aches   Prednisone Other (See Comments)    **INJECTIONS ONLY** caused tachycardia    Sulfamethoxazole-Trimethoprim Other (See Comments)    Swelling of nostrils   Current Outpatient Medications on File Prior to Visit  Medication Sig Dispense Refill   acetaminophen (TYLENOL) 500 MG tablet Take 500 mg by mouth at bedtime.     ALPRAZolam (XANAX) 1 MG tablet TAKE 1/2 TABLET BY MOUTH AT BEDTIME AS NEEDED FOR ANXIETY OR SLEEP 15 tablet 3   ASPERCREME LIDOCAINE EX Apply 1 application topically daily as needed.     ASPIRIN PO Take 81 mg by mouth daily.     Calcium-Magnesium-Vitamin D (CALCIUM 1200+D3 PO) Take 1 tablet by mouth at bedtime.     diclofenac Sodium (VOLTAREN) 1 % GEL APPLY 4 GRAMS TO AFFECTED AREA 4 TIMES DAILY 200 g 11   MELATONIN PO Take 10 mg by mouth daily.     methocarbamol (ROBAXIN) 750 MG tablet Take 750 mg by mouth at bedtime.     Multiple Vitamins-Minerals (EYE VITAMINS PO) Take 1 capsule by mouth daily.     Omega-3 Fatty Acids (OMEGA-3 FISH OIL PO) Take 1,000 mg by mouth daily.     No current facility-administered medications on file prior to visit.    Review of Systems  Constitutional:  Negative for activity change, appetite change, fatigue, fever and unexpected weight change.  HENT:  Negative for congestion, ear pain, rhinorrhea, sinus pressure and sore throat.   Eyes:  Negative for pain, redness and visual disturbance.  Respiratory:  Negative for cough, shortness of breath and wheezing.   Cardiovascular:  Negative for chest pain and palpitations.  Gastrointestinal:  Negative for abdominal pain, blood in stool, constipation and diarrhea.  Endocrine: Negative for polydipsia and polyuria.  Genitourinary:  Negative for dysuria, frequency and urgency.  Musculoskeletal:  Positive for arthralgias. Negative for  back pain and myalgias.       Foot/heel pain  Planning surgery  Skin:  Negative for pallor and rash.  Allergic/Immunologic: Negative for environmental allergies.  Neurological:  Negative for dizziness, syncope and headaches.  Hematological:  Negative for adenopathy. Does not bruise/bleed easily.  Psychiatric/Behavioral:  Negative for decreased concentration and dysphoric mood. The patient is not nervous/anxious.       Objective:   Physical Exam Constitutional:      General: She is not in acute distress.  Appearance: Normal appearance. She is well-developed and normal weight. She is not ill-appearing or diaphoretic.  HENT:     Head: Normocephalic and atraumatic.     Right Ear: Tympanic membrane, ear canal and external ear normal.     Left Ear: Tympanic membrane, ear canal and external ear normal.     Nose: Nose normal. No congestion.     Mouth/Throat:     Mouth: Mucous membranes are moist.     Pharynx: Oropharynx is clear. No posterior oropharyngeal erythema.  Eyes:     General: No scleral icterus.    Extraocular Movements: Extraocular movements intact.     Conjunctiva/sclera: Conjunctivae normal.     Pupils: Pupils are equal, round, and reactive to light.  Neck:     Thyroid: No thyromegaly.     Vascular: No carotid bruit or JVD.  Cardiovascular:     Rate and Rhythm: Normal rate and regular rhythm.     Pulses: Normal pulses.     Heart sounds: Normal heart sounds.    No gallop.  Pulmonary:     Effort: Pulmonary effort is normal. No respiratory distress.     Breath sounds: Normal breath sounds. No wheezing.     Comments: Good air exch Chest:     Chest wall: No tenderness.  Abdominal:     General: Bowel sounds are normal. There is no distension or abdominal bruit.     Palpations: Abdomen is soft. There is no mass.     Tenderness: There is no abdominal tenderness.     Hernia: No hernia is present.  Genitourinary:    Comments: Breast exam: No mass, nodules, thickening,  tenderness, bulging, retraction, inflamation, nipple discharge or skin changes noted.  No axillary or clavicular LA.     Musculoskeletal:        General: No tenderness. Normal range of motion.     Cervical back: Normal range of motion and neck supple. No rigidity. No muscular tenderness.     Right lower leg: No edema.     Left lower leg: No edema.     Comments: No kyphosis   Noted knot on back of L heel  Lymphadenopathy:     Cervical: No cervical adenopathy.  Skin:    General: Skin is warm and dry.     Coloration: Skin is not pale.     Findings: No erythema or rash.     Comments: Solar lentigines diffusely Some sks  Neurological:     Mental Status: She is alert. Mental status is at baseline.     Cranial Nerves: No cranial nerve deficit.     Motor: No abnormal muscle tone.     Coordination: Coordination normal.     Gait: Gait normal.     Deep Tendon Reflexes: Reflexes are normal and symmetric. Reflexes normal.  Psychiatric:        Mood and Affect: Mood normal.        Cognition and Memory: Cognition and memory normal.          Assessment & Plan:   Problem List Items Addressed This Visit       Endocrine   Subclinical hypothyroidism    No clinical changes TSH and FT4 ordered        Musculoskeletal and Integument   Rash    Dry looking skin on upper forearms Possible eczema  Adv use of moisturizer  Avoid other otc  Triamcinolone cream px  Update if not starting to improve in a week or  if worsening          Other   Routine general medical examination at a health care facility - Primary    Reviewed health habits including diet and exercise and skin cancer prevention Reviewed appropriate screening tests for age  Also reviewed health mt list, fam hx and immunization status , as well as social and family history   See HPI Labs ordered  amw reviewed  Good exercise  imms utd, planning to start shingrix series Mammogram utd  Colonoscopy utd and cologuard  ordered dexa utd , discussed fall prevention        Prediabetes    A1C ordered disc imp of low glycemic diet and wt loss to prevent DM2       Current use of proton pump inhibitor    B12 added to labs  Takes bid  Unable to go without it, protonix      Relevant Orders   Vitamin B12   Colon cancer screening    Colonoscopy 2014 with 10 y recall  cologuard ordered       Relevant Orders   Cologuard

## 2021-07-20 NOTE — Assessment & Plan Note (Signed)
Reviewed health habits including diet and exercise and skin cancer prevention Reviewed appropriate screening tests for age  Also reviewed health mt list, fam hx and immunization status , as well as social and family history   See HPI Labs ordered  amw reviewed  Good exercise  imms utd, planning to start shingrix series Mammogram utd  Colonoscopy utd and cologuard ordered dexa utd , discussed fall prevention

## 2021-07-20 NOTE — Assessment & Plan Note (Signed)
No clinical changes TSH and FT4 ordered  

## 2021-07-20 NOTE — Assessment & Plan Note (Signed)
Dry looking skin on upper forearms Possible eczema  Adv use of moisturizer  Avoid other otc  Triamcinolone cream px  Update if not starting to improve in a week or if worsening

## 2021-07-20 NOTE — Patient Instructions (Addendum)
If you are interested in the new shingles vaccine (Shingrix) - call your local pharmacy to check on coverage and availability  If affordable, get on a wait list at your pharmacy to get the vaccine.   I ordered cologuard so you will get notification   Good luck with foot surgery   Keep up the good exercise

## 2021-07-20 NOTE — Assessment & Plan Note (Signed)
B12 added to labs  Takes bid  Unable to go without it, protonix

## 2021-07-20 NOTE — Assessment & Plan Note (Signed)
A1C ordered °disc imp of low glycemic diet and wt loss to prevent DM2  °

## 2021-07-22 DIAGNOSIS — M79672 Pain in left foot: Secondary | ICD-10-CM | POA: Diagnosis not present

## 2021-07-23 ENCOUNTER — Other Ambulatory Visit: Payer: Self-pay | Admitting: Family Medicine

## 2021-07-28 DIAGNOSIS — Z1211 Encounter for screening for malignant neoplasm of colon: Secondary | ICD-10-CM | POA: Diagnosis not present

## 2021-07-28 NOTE — Telephone Encounter (Signed)
Name of Medication: Xanax Name of Pharmacy: [facility] or Written Date and Quantity: 03/24/21 #15 tabs with 3 refills Last Office Visit and Type: CPE 07/20/21 Next Office Visit and Type: none scheduled

## 2021-07-31 ENCOUNTER — Observation Stay (HOSPITAL_BASED_OUTPATIENT_CLINIC_OR_DEPARTMENT_OTHER)
Admission: EM | Admit: 2021-07-31 | Discharge: 2021-08-02 | Disposition: A | Discharge status: Home / Self Care | Payer: Medicare Other | Attending: Internal Medicine | Admitting: Internal Medicine

## 2021-07-31 ENCOUNTER — Emergency Department (HOSPITAL_BASED_OUTPATIENT_CLINIC_OR_DEPARTMENT_OTHER): Payer: Medicare Other

## 2021-07-31 ENCOUNTER — Emergency Department (HOSPITAL_BASED_OUTPATIENT_CLINIC_OR_DEPARTMENT_OTHER): Payer: Medicare Other | Admitting: Radiology

## 2021-07-31 ENCOUNTER — Encounter (HOSPITAL_COMMUNITY): Payer: Self-pay

## 2021-07-31 ENCOUNTER — Ambulatory Visit (HOSPITAL_COMMUNITY)
Admission: EM | Admit: 2021-07-31 | Discharge: 2021-07-31 | Disposition: A | Discharge status: Home / Self Care | Payer: Medicare Other

## 2021-07-31 ENCOUNTER — Encounter (HOSPITAL_BASED_OUTPATIENT_CLINIC_OR_DEPARTMENT_OTHER): Payer: Self-pay | Admitting: Emergency Medicine

## 2021-07-31 ENCOUNTER — Telehealth: Payer: Self-pay

## 2021-07-31 ENCOUNTER — Other Ambulatory Visit: Payer: Self-pay

## 2021-07-31 DIAGNOSIS — F411 Generalized anxiety disorder: Secondary | ICD-10-CM | POA: Diagnosis present

## 2021-07-31 DIAGNOSIS — Z79899 Other long term (current) drug therapy: Secondary | ICD-10-CM | POA: Diagnosis not present

## 2021-07-31 DIAGNOSIS — T50Z95A Adverse effect of other vaccines and biological substances, initial encounter: Secondary | ICD-10-CM

## 2021-07-31 DIAGNOSIS — R7303 Prediabetes: Secondary | ICD-10-CM | POA: Diagnosis not present

## 2021-07-31 DIAGNOSIS — R0602 Shortness of breath: Secondary | ICD-10-CM | POA: Diagnosis not present

## 2021-07-31 DIAGNOSIS — R29898 Other symptoms and signs involving the musculoskeletal system: Secondary | ICD-10-CM | POA: Diagnosis not present

## 2021-07-31 DIAGNOSIS — M5432 Sciatica, left side: Principal | ICD-10-CM | POA: Insufficient documentation

## 2021-07-31 DIAGNOSIS — R531 Weakness: Secondary | ICD-10-CM

## 2021-07-31 DIAGNOSIS — K219 Gastro-esophageal reflux disease without esophagitis: Secondary | ICD-10-CM | POA: Diagnosis present

## 2021-07-31 DIAGNOSIS — R519 Headache, unspecified: Secondary | ICD-10-CM | POA: Diagnosis not present

## 2021-07-31 DIAGNOSIS — M549 Dorsalgia, unspecified: Secondary | ICD-10-CM | POA: Diagnosis present

## 2021-07-31 DIAGNOSIS — D649 Anemia, unspecified: Secondary | ICD-10-CM | POA: Diagnosis not present

## 2021-07-31 DIAGNOSIS — Z20822 Contact with and (suspected) exposure to covid-19: Secondary | ICD-10-CM | POA: Diagnosis not present

## 2021-07-31 DIAGNOSIS — Z7982 Long term (current) use of aspirin: Secondary | ICD-10-CM | POA: Insufficient documentation

## 2021-07-31 DIAGNOSIS — L03114 Cellulitis of left upper limb: Secondary | ICD-10-CM

## 2021-07-31 DIAGNOSIS — I639 Cerebral infarction, unspecified: Secondary | ICD-10-CM | POA: Diagnosis not present

## 2021-07-31 DIAGNOSIS — R9431 Abnormal electrocardiogram [ECG] [EKG]: Secondary | ICD-10-CM | POA: Diagnosis not present

## 2021-07-31 LAB — COMPREHENSIVE METABOLIC PANEL
ALT: 30 U/L (ref 0–44)
AST: 18 U/L (ref 15–41)
Albumin: 4.6 g/dL (ref 3.5–5.0)
Alkaline Phosphatase: 55 U/L (ref 38–126)
Anion gap: 11 (ref 5–15)
BUN: 12 mg/dL (ref 8–23)
CO2: 25 mmol/L (ref 22–32)
Calcium: 9.2 mg/dL (ref 8.9–10.3)
Chloride: 107 mmol/L (ref 98–111)
Creatinine, Ser: 0.56 mg/dL (ref 0.44–1.00)
GFR, Estimated: >60 mL/min (ref >60)
Glucose, Bld: 145 mg/dL — ABNORMAL HIGH (ref 70–99)
Potassium: 3.8 mmol/L (ref 3.5–5.1)
Sodium: 143 mmol/L (ref 135–145)
Total Bilirubin: 0.5 mg/dL (ref 0.3–1.2)
Total Protein: 7.3 g/dL (ref 6.5–8.1)

## 2021-07-31 LAB — TROPONIN I (HIGH SENSITIVITY): Troponin I (High Sensitivity): 5 ng/L (ref <18)

## 2021-07-31 LAB — CBC WITH DIFFERENTIAL/PLATELET
Abs Immature Granulocytes: 0.03 10*3/uL (ref 0.00–0.07)
Basophils Absolute: 0 10*3/uL (ref 0.0–0.1)
Basophils Relative: 1 %
Eosinophils Absolute: 0.2 10*3/uL (ref 0.0–0.5)
Eosinophils Relative: 3 %
HCT: 40.9 % (ref 36.0–46.0)
Hemoglobin: 13.1 g/dL (ref 12.0–15.0)
Immature Granulocytes: 1 %
Lymphocytes Relative: 37 %
Lymphs Abs: 2.4 10*3/uL (ref 0.7–4.0)
MCH: 28.1 pg (ref 26.0–34.0)
MCHC: 32 g/dL (ref 30.0–36.0)
MCV: 87.6 fL (ref 80.0–100.0)
Monocytes Absolute: 0.5 10*3/uL (ref 0.1–1.0)
Monocytes Relative: 8 %
Neutro Abs: 3.3 10*3/uL (ref 1.7–7.7)
Neutrophils Relative %: 50 %
Platelets: 270 10*3/uL (ref 150–400)
RBC: 4.67 MIL/uL (ref 3.87–5.11)
RDW: 13.6 % (ref 11.5–15.5)
WBC: 6.4 10*3/uL (ref 4.0–10.5)
nRBC: 0 % (ref 0.0–0.2)

## 2021-07-31 LAB — CBG MONITORING, ED: Glucose-Capillary: 143 mg/dL — ABNORMAL HIGH (ref 70–99)

## 2021-07-31 LAB — COLOGUARD: COLOGUARD: NEGATIVE

## 2021-07-31 LAB — PROTIME-INR
INR: 1 (ref 0.8–1.2)
Prothrombin Time: 13.1 seconds (ref 11.4–15.2)

## 2021-07-31 LAB — BRAIN NATRIURETIC PEPTIDE: B Natriuretic Peptide: 43.4 pg/mL (ref 0.0–100.0)

## 2021-07-31 LAB — D-DIMER, QUANTITATIVE: D-Dimer, Quant: <0.27 ug/mL-FEU (ref 0.00–0.50)

## 2021-07-31 MED ORDER — CEPHALEXIN 250 MG PO CAPS
500.0000 mg | ORAL_CAPSULE | Freq: Once | ORAL | Status: AC
  Administered 2021-07-31: 500 mg via ORAL
  Filled 2021-07-31: qty 2

## 2021-07-31 NOTE — Telephone Encounter (Signed)
I spoke with pt; pt said sciatic pain is really painful now. No N&V now. Pt scheduled appt at [facility] on [address] for 08/01/21 at [place] & ED precautions given and pt voiced understanding. Sending note to Dr Glori Bickers who is out of office and Dr Damita Dunnings who is in office and [place] CMA.

## 2021-07-31 NOTE — Discharge Instructions (Addendum)
-  Please head to the emergency department for further evaluation and management to rule out Guillain-Barr disease.  The emergency department provider may choose to do a lumbar puncture to get a small amount of your spinal fluid analysis, which we cannot do at urgent care.

## 2021-07-31 NOTE — ED Provider Notes (Signed)
[name]    CSN: [id number] Arrival date & time: 07/31/21  1712      History   Chief Complaint Chief Complaint  Patient presents with   Neck Pain   Arm Pain    HPI [name] is a 70 y.o. female presenting with concern for Guillain-Barr following receiving a shingles shot 2 days ago.  Medical history noncontributory, denies history of vaccine reaction in the past, including to the Shingrix vaccine she received when she was 65.  States immediately after she received the vaccine she had body aches and weakness and fatigue, now for 1 day she has had some weakness on the left side of her body with shortness of breath and new sciatica on the left side.  States that she dropped a fork while she was trying to eat. it is hard to walk because of this.  States she has never had sciatica before, and this feels like a shooting pain down her left buttock and into the leg.  Denies dizziness or chest pain.  HPI  Past Medical History:  Diagnosis Date   Depression    Fatty liver    Fibrocystic disease of both breasts    GERD (gastroesophageal reflux disease)    Hyperglycemia    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   On continue HRT since 1980 per patient   Sphincter of Oddi dysfunction     Patient Active Problem List   Diagnosis Date Noted   Current use of proton pump inhibitor 07/20/2021   Rash 07/20/2021   Colon cancer screening 07/20/2021   Hot flashes 07/11/2020   Subclinical hypothyroidism 07/11/2020   Allergy to statin medication 07/11/2020   Medicare annual wellness visit, subsequent 07/10/2019   Paresthesia 04/06/2019   Foot pain, bilateral 04/06/2019   Vaginal atrophy 06/09/2018   Breast cancer screening 05/31/2017   Prediabetes 06/02/2016   Estrogen deficiency 01/29/2015   Routine general medical examination at a health care facility 02/20/2012   GERD 06/20/2008    Past Surgical History:  Procedure Laterality Date   ABDOMINAL HYSTERECTOMY      CARPAL TUNNEL RELEASE  08/2008   left   ERCP     LEFT HEART CATHETERIZATION WITH CORONARY ANGIOGRAM N/A 06/08/2013   Procedure: LEFT HEART CATHETERIZATION WITH CORONARY ANGIOGRAM;  Surgeon: Ramond Dial, MD;  Location: [facility] CATH LAB;  Service: Cardiovascular;  Laterality: N/A;   MASTECTOMY     bilateral for severe macrocystic breast   SPINE SURGERY N/A 02/12/2020   neck    OB History     Gravida  3   Para  0   Term  0   Preterm  0   AB  1   Living  2      SAB  0   IAB  0   Ectopic  1   Multiple  0   Live Births  2            Home Medications    Prior to Admission medications   Medication Sig Start Date End Date Taking? Authorizing Provider  acetaminophen (TYLENOL) 500 MG tablet Take 500 mg by mouth at bedtime.    [provider]  ALPRAZolam Duanne Moron) 1 MG tablet TAKE 1/2 TABLET BY MOUTH AT BEDTIME AS NEEDED FOR ANXIETY OR SLEEP 07/28/21   Tower, Wynelle Fanny, MD  ASPERCREME LIDOCAINE EX Apply 1 application topically daily as needed.    [provider]  ASPIRIN PO Take 81 mg by mouth  daily.    [provider]  Calcium-Magnesium-Vitamin D (CALCIUM 1200+D3 PO) Take 1 tablet by mouth at bedtime.    [provider]  diclofenac Sodium (VOLTAREN) 1 % GEL APPLY 4 GRAMS TO AFFECTED AREA 4 TIMES DAILY 05/18/21   Tower, Wynelle Fanny, MD  MELATONIN PO Take 10 mg by mouth daily.    [provider]  methocarbamol (ROBAXIN) 750 MG tablet Take 750 mg by mouth at bedtime. 06/27/20   [provider]  Multiple Vitamins-Minerals (EYE VITAMINS PO) Take 1 capsule by mouth daily.    [provider]  Omega-3 Fatty Acids (OMEGA-3 FISH OIL PO) Take 1,000 mg by mouth daily.    [provider]  pantoprazole (PROTONIX) 40 MG tablet Take 1 tablet (40 mg total) by mouth 2 (two) times daily. 07/20/21   Tower, Wynelle Fanny, MD  triamcinolone cream (KENALOG) 0.1 % Apply 1 application topically 2 (two) times daily as needed. rash  07/20/21   Tower, Wynelle Fanny, MD    Family History Family History  Problem Relation Age of Onset   Brain cancer Mother        hospice   Diabetes Father    Hypertension Father    Cancer Father        prostate and leukemia   Breast cancer Sister 42   Cancer Sister    Cancer Maternal Aunt        Pancreatic   Diabetes Maternal Uncle    Heart disease Maternal Grandmother     Social History Social History   Tobacco Use   Smoking status: Never   Smokeless tobacco: Never  Vaping Use   Vaping Use: Never used  Substance Use Topics   Alcohol use: Yes    Comment: once every 2 weeks   Drug use: No     Allergies   Atorvastatin, Ezetimibe, Prednisone, and Sulfamethoxazole-trimethoprim   Review of Systems Review of Systems  Respiratory:  Positive for shortness of breath.   Neurological:  Positive for weakness.  All other systems reviewed and are negative.   Physical Exam Triage Vital Signs ED Triage Vitals  Enc Vitals Group     BP 07/31/21 1834 (!) 168/91     Pulse Rate 07/31/21 1834 66     Resp 07/31/21 1834 19     Temp 07/31/21 1834 98.2 F (36.8 C)     Temp Source 07/31/21 1834 Oral     SpO2 07/31/21 1834 96 %     Weight --      Height --      Head Circumference --      Peak Flow --      Pain Score 07/31/21 1833 5     Pain Loc --      Pain Edu? --      Excl. in [place]? --    No data found.  Updated Vital Signs BP (!) 168/91 (BP Location: Right Arm)   Pulse 66   Temp 98.2 F (36.8 C) (Oral)   Resp 19   SpO2 96%   Visual Acuity Right Eye Distance:   Left Eye Distance:   Bilateral Distance:    Right Eye Near:   Left Eye Near:    Bilateral Near:     Physical Exam Vitals reviewed.  Constitutional:      General: She is not in acute distress.    Appearance: Normal appearance. She is not ill-appearing or diaphoretic.  HENT:     Head: Normocephalic and atraumatic.  Cardiovascular:  Rate and Rhythm: Normal rate and regular rhythm.     Heart sounds:  Normal heart sounds.  Pulmonary:     Effort: Pulmonary effort is normal.     Breath sounds: Normal breath sounds.  Skin:    General: Skin is warm.  Neurological:     General: No focal deficit present.     Mental Status: She is alert and oriented to person, place, and time.     Comments: PERRLA, EOMI. AO x3. Strength does seem to be appreciably weaker on the left side- 4/5 L arm and leg, compared with 5/5 R arm and leg. Negative rhomberg.   Psychiatric:        Mood and Affect: Mood normal.        Behavior: Behavior normal.        Thought Content: Thought content normal.        Judgment: Judgment normal.     UC Treatments / Results  Labs (all labs ordered are listed, but only abnormal results are displayed) Labs Reviewed - No data to display  EKG   Radiology No results found.  Procedures Procedures (including critical care time)  Medications Ordered in UC Medications - No data to display  Initial Impression / Assessment and Plan / UC Course  I have reviewed the triage vital signs and the nursing notes.  Pertinent labs & imaging results that were available during my care of the patient were reviewed by me and considered in my medical decision making (see chart for details).     This patient is a very pleasant 70 y.o. year old female presenting with concern for guillain barre following shingrix 2 days ago. Sent to ED via personal vehicle driven by husband, patient is in agreement.   Final Clinical Impressions(s) / UC Diagnoses   Final diagnoses:  Vaccine reaction, initial encounter     Discharge Instructions      -Please head to the emergency department for further evaluation and management to rule out Guillain-Barr disease.  The emergency department provider may choose to do a lumbar puncture to get a small amount of your spinal fluid analysis, which we cannot do at urgent care.   ED Prescriptions   None    PDMP not reviewed this encounter.   Hazel Sams, PA-C 07/31/21 1858

## 2021-07-31 NOTE — ED Triage Notes (Signed)
Pt presents with c/o left arm pain, redness and swelling after receiving a Shingles injection on Wednesday. Pt states she woke up the next day feeling ill.

## 2021-07-31 NOTE — Telephone Encounter (Signed)
Aware, will watch for correspondence  

## 2021-07-31 NOTE — Telephone Encounter (Signed)
[facility] - Client TELEPHONE ADVICE RECORD [facility] Patient Name: [name] Gender: Female DOB: April 09, 1951 Age: 70 Y 10 M 17 D Return Phone Number: [phone] (Primary) Address: City/ State/ Zip: [place]   [address] Client [facility] - Client Client Site [place] Provider Glori Bickers, Roque Lias - MD Contact Type Call Who Is Calling Patient / Member / Family / Caregiver Call Type Triage / Clinical Relationship To Patient Self Return Phone Number [phone] (Primary) Chief Complaint Vomiting Reason for Call Symptomatic / Request for [place] says that she had the shingles vaccine yesterday and she is vomiting, nausea, and her sciatic nerve flared up PT urinated within 8 hours. Translation No Nurse Assessment Nurse: Eugenio Hoes, RN, Jenny Reichmann Date/Time (Eastern Time): 07/31/2021 12:07:52 PM Confirm and document reason for call. If symptomatic, describe symptoms. ---Caller states that she had her shingles vaccine day before yesterday and she is now having nausea and diarrhea and chills. Denies fever. Sciatic pain has flared up and is 8/10. Pain is more apparent when she moves and walks. Does the patient have any new or worsening symptoms? ---Yes Will a triage be completed? ---Yes Related visit to physician within the last 2 weeks? ---No Does the PT have any chronic conditions? (i.e. diabetes, asthma, this includes High risk factors for pregnancy, etc.) ---Yes List chronic conditions. ---pre-diabetic Is this a behavioral health or substance abuse call? ---No Guidelines Guideline Title Affirmed Question Affirmed Notes Nurse Date/Time (Eastern Time) Immunization Reactions [1] Redness or red streak around the injection site AND [2] begins > 48 hours after shot AND [3] no fever (Exception: red area < 1 inch or 2.5 cm wide) Eugenio Hoes, RN, Jenny Reichmann 07/31/2021  12:10:17 PM PLEASE NOTE: All timestamps contained within this report are represented as [place] Standard Time. CONFIDENTIALTY NOTICE: This fax transmission is intended only for the addressee. It contains information that is legally privileged, confidential or otherwise protected from use or disclosure. If you are not the intended recipient, you are strictly prohibited from reviewing, disclosing, copying using or disseminating any of this information or taking any action in reliance on or regarding this information. If you have received this fax in error, please notify us immediately by telephone so that we can arrange for its return to [place]. Phone: [phone], Toll-Free: [phone], Fax: [phone] Page: 2 of 2 Call Id: [id number] Bells. Time Eilene Ghazi Time) Disposition Final User 07/31/2021 12:14:16 PM Call PCP within 24 Hours Yes Eugenio Hoes, RN, Jenny Reichmann Caller Disagree/Comply Comply Caller Understands Yes PreDisposition Did not know what to do Care Advice Given Per Guideline CALL PCP WITHIN 24 HOURS: * IF OFFICE WILL BE OPEN: Call the office when it opens tomorrow morning. PAIN MEDICINES: * ACETAMINOPHEN - REGULAR STRENGTH TYLENOL: Take 650 mg (two 325 mg pills) by mouth every 4 to 6 hours as needed. Each Regular Strength Tylenol pill has 325 mg of acetaminophen. The most you should take is 10 pills a day (3,250 mg total). Note: In [place], the maximum is 12 pills a day (3,900 mg total). * IBUPROFEN (E.G., MOTRIN, ADVIL): Take 400 mg (two 200 mg pills) by mouth every 6 hours. The most you should take is 6 pills a day (1,200 mg total). CALL BACK IF: * Fever occurs * You become worse CARE ADVICE given per Immunization Reactions (Adult) guideline

## 2021-07-31 NOTE — ED Notes (Signed)
Patient is being discharged from the Urgent Care and sent to the Emergency Department via POV . Per Marin Roberts, patient is in need of higher level of care due to patient is concerned of Guillain-Barre syndrome. Patient is aware and verbalizes understanding of plan of care.  Vitals:   07/31/21 1834  BP: (!) 168/91  Pulse: 66  Resp: 19  Temp: 98.2 F (36.8 C)  SpO2: 96%

## 2021-07-31 NOTE — ED Provider Notes (Signed)
[facility] EMERGENCY DEPT Provider Note   CSN: [id number] Arrival date & time: 07/31/21  1946     History Chief Complaint  Patient presents with   Back Pain    [name] is a 70 y.o. female.   Back Pain Associated symptoms: numbness and weakness   Associated symptoms: no abdominal pain, no chest pain, no dysuria and no fever    70 year old female with medical history significant for depression, GERD, hyperglycemia, presenting to the emergency department after a vaccine reaction.  The patient was administered the shingles shot 2 days ago.  She states that immediately after the shot she developed diffuse body aches, weakness and fatigue that was generalized.  For the past day she has had new shortness of breath.  She endorses radicular pain down her left leg that radiates down from her back.  She felt weak and dropped a fork while holding it with her right hand.  For the past 2 days she has had some difficulty with ambulation due to generalized fatigue and weakness.  She was seen at urgent care earlier today and referred to the emergency department for further evaluation due to concern for possible GBS.  Additionally, the patient endorses sharp, midsternal pain with no radiation, no aggravating or alleviating factors, no clear pleuritic component.  Past Medical History:  Diagnosis Date   Depression    Fatty liver    Fibrocystic disease of both breasts    GERD (gastroesophageal reflux disease)    Hyperglycemia    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   On continue HRT since 1980 per patient   Sphincter of Oddi dysfunction     Patient Active Problem List   Diagnosis Date Noted   Acute left-sided weakness 08/01/2021   Current use of proton pump inhibitor 07/20/2021   Rash 07/20/2021   Colon cancer screening 07/20/2021   Hot flashes 07/11/2020   Subclinical hypothyroidism 07/11/2020   Allergy to statin medication 07/11/2020   Medicare annual  wellness visit, subsequent 07/10/2019   Paresthesia 04/06/2019   Foot pain, bilateral 04/06/2019   Vaginal atrophy 06/09/2018   Breast cancer screening 05/31/2017   Prediabetes 06/02/2016   Estrogen deficiency 01/29/2015   Routine general medical examination at a health care facility 02/20/2012   GERD 06/20/2008    Past Surgical History:  Procedure Laterality Date   ABDOMINAL HYSTERECTOMY     CARPAL TUNNEL RELEASE  08/2008   left   ERCP     LEFT HEART CATHETERIZATION WITH CORONARY ANGIOGRAM N/A 06/08/2013   Procedure: LEFT HEART CATHETERIZATION WITH CORONARY ANGIOGRAM;  Surgeon: Ramond Dial, MD;  Location: [facility] CATH LAB;  Service: Cardiovascular;  Laterality: N/A;   MASTECTOMY     bilateral for severe macrocystic breast   SPINE SURGERY N/A 02/12/2020   neck     OB History     Gravida  3   Para  0   Term  0   Preterm  0   AB  1   Living  2      SAB  0   IAB  0   Ectopic  1   Multiple  0   Live Births  2           Family History  Problem Relation Age of Onset   Brain cancer Mother        hospice   Diabetes Father    Hypertension Father    Cancer Father  prostate and leukemia   Breast cancer Sister 57   Cancer Sister    Cancer Maternal Aunt        Pancreatic   Diabetes Maternal Uncle    Heart disease Maternal Grandmother     Social History   Tobacco Use   Smoking status: Never   Smokeless tobacco: Never  Vaping Use   Vaping Use: Never used  Substance Use Topics   Alcohol use: Yes    Comment: once every 2 weeks   Drug use: No    Home Medications Prior to Admission medications   Medication Sig Start Date End Date Taking? Authorizing Provider  acetaminophen (TYLENOL) 500 MG tablet Take 500 mg by mouth at bedtime.    [provider]  ALPRAZolam Duanne Moron) 1 MG tablet TAKE 1/2 TABLET BY MOUTH AT BEDTIME AS NEEDED FOR ANXIETY OR SLEEP 07/28/21   Tower, Wynelle Fanny, MD  ASPERCREME LIDOCAINE EX Apply 1 application topically  daily as needed.    [provider]  ASPIRIN PO Take 81 mg by mouth daily.    [provider]  Calcium-Magnesium-Vitamin D (CALCIUM 1200+D3 PO) Take 1 tablet by mouth at bedtime.    [provider]  diclofenac Sodium (VOLTAREN) 1 % GEL APPLY 4 GRAMS TO AFFECTED AREA 4 TIMES DAILY 05/18/21   Tower, Wynelle Fanny, MD  MELATONIN PO Take 10 mg by mouth daily.    [provider]  methocarbamol (ROBAXIN) 750 MG tablet Take 750 mg by mouth at bedtime. 06/27/20   [provider]  Multiple Vitamins-Minerals (EYE VITAMINS PO) Take 1 capsule by mouth daily.    [provider]  Omega-3 Fatty Acids (OMEGA-3 FISH OIL PO) Take 1,000 mg by mouth daily.    [provider]  pantoprazole (PROTONIX) 40 MG tablet Take 1 tablet (40 mg total) by mouth 2 (two) times daily. 07/20/21   Tower, Wynelle Fanny, MD  triamcinolone cream (KENALOG) 0.1 % Apply 1 application topically 2 (two) times daily as needed. rash 07/20/21   Tower, Wynelle Fanny, MD    Allergies    Atorvastatin, Ezetimibe, Polyoxyethylene 40 sorbitol septaoleate [sorbitan], Prednisone, and Sulfamethoxazole-trimethoprim  Review of Systems   Review of Systems  Constitutional:  Positive for fatigue. Negative for chills and fever.  HENT:  Negative for ear pain and sore throat.   Eyes:  Negative for pain and visual disturbance.  Respiratory:  Negative for cough and shortness of breath.   Cardiovascular:  Negative for chest pain and palpitations.  Gastrointestinal:  Negative for abdominal pain and vomiting.  Genitourinary:  Negative for dysuria and hematuria.  Musculoskeletal:  Positive for back pain and myalgias. Negative for arthralgias.  Skin:  Negative for color change and rash.  Neurological:  Positive for weakness and numbness. Negative for seizures, syncope and facial asymmetry.  All other systems reviewed and are negative.  Physical Exam Updated Vital Signs BP (!) 134/108   Pulse 79   Temp 98.6 F  (37 C)   Resp 17   SpO2 97%   Physical Exam Vitals and nursing note reviewed.  Constitutional:      General: She is not in acute distress.    Appearance: She is well-developed.  HENT:     Head: Normocephalic and atraumatic.  Eyes:     Conjunctiva/sclera: Conjunctivae normal.     Pupils: Pupils are equal, round, and reactive to light.  Cardiovascular:     Rate and Rhythm: Normal rate and regular rhythm.     Heart sounds:  No murmur heard. Pulmonary:     Effort: Pulmonary effort is normal. No respiratory distress.     Breath sounds: Normal breath sounds.  Abdominal:     General: There is no distension.     Palpations: Abdomen is soft.     Tenderness: There is no abdominal tenderness. There is no guarding.  Musculoskeletal:        General: No swelling, deformity or signs of injury.     Cervical back: Neck supple.  Skin:    General: Skin is warm and dry.     Capillary Refill: Capillary refill takes less than 2 seconds.     Findings: Rash present. No lesion.     Comments: Erythema and warmth of the left upper extremity concerning for cellulitis.  No crepitus or fluctuance   Neurological:     General: No focal deficit present.     Mental Status: She is alert. Mental status is at baseline.     GCS: GCS eye subscore is 4. GCS verbal subscore is 5. GCS motor subscore is 6.     Comments: MENTAL STATUS EXAM:    Orientation: Alert and oriented to person, place and time.  Memory: Cooperative, follows commands well.  Language: Speech is clear and language is normal.   CRANIAL NERVES:    CN 2 (Optic): Visual fields intact to confrontation.  CN 3,4,6 (EOM): Pupils equal and reactive to light. Full extraocular eye movement without nystagmus.  CN 5 (Trigeminal): Facial sensation is normal, no weakness of masticatory muscles.  CN 7 (Facial): No facial weakness or asymmetry.  CN 8 (Auditory): Auditory acuity grossly normal.  CN 9,10 (Glossophar): The uvula is midline, the palate elevates  symmetrically.  CN 11 (spinal access): Normal sternocleidomastoid and trapezius strength.  CN 12 (Hypoglossal): The tongue is midline. No atrophy or fasciculations.Marland Kitchen   MOTOR:  Muscle Strength: 5/5RUE, 5/5LUE, 5/5RLE, 4/5LLE  REFLEXES: 2+ Patellar reflexes bilaterally.   COORDINATION:   Intact finger-to-nose, no tremor, no pronator drift.  SENSATION:   Decreased sensation to light touch in the LUE and LLE compared to the right hemibody.  GAIT: Gait normal without ataxia   Psychiatric:        Mood and Affect: Mood normal.    ED Results / Procedures / Treatments   Labs (all labs ordered are listed, but only abnormal results are displayed) Labs Reviewed  COMPREHENSIVE METABOLIC PANEL - Abnormal; Notable for the following components:      Result Value   Glucose, Bld 145 (*)    All other components within normal limits  CBG MONITORING, ED - Abnormal; Notable for the following components:   Glucose-Capillary 143 (*)    All other components within normal limits  RESP PANEL BY RT-PCR (FLU A&B, COVID) [id number]  CBC WITH DIFFERENTIAL/PLATELET  D-DIMER, QUANTITATIVE  BRAIN NATRIURETIC PEPTIDE  PROTIME-INR  CK  TSH  TROPONIN I (HIGH SENSITIVITY)  TROPONIN I (HIGH SENSITIVITY)    EKG EKG Interpretation  Date/Time:  Friday July 31 2021 21:25:20 EST Ventricular Rate:  80 PR Interval:  156 QRS Duration: 139 QT Interval:  411 QTC Calculation: 475 R Axis:   75 Text Interpretation: Sinus rhythm IVCD, consider atypical LBBB Confirmed by Regan Lemming ([id number]) on 07/31/2021 10:04:15 PM  Radiology DG Chest 2 View  Result Date: 07/31/2021 CLINICAL DATA:  Shortness of breath EXAM: CHEST - 2 VIEW COMPARISON:  None. FINDINGS: The heart size and mediastinal contours are within normal limits. Both lungs are clear. Hardware in the cervical spine.  IMPRESSION: No active cardiopulmonary disease. Electronically Signed   By: Donavan Foil M.D.   On: 07/31/2021 22:26   CT Head Wo  Contrast  Result Date: 07/31/2021 CLINICAL DATA:  Headache EXAM: CT HEAD WITHOUT CONTRAST TECHNIQUE: Contiguous axial images were obtained from the base of the skull through the vertex without intravenous contrast. COMPARISON:  None. FINDINGS: Brain: No evidence of acute infarction, hemorrhage, hydrocephalus, extra-axial collection or mass lesion/mass effect. Vascular: No hyperdense vessel or unexpected calcification. Skull: Normal. Negative for fracture or focal lesion. Sinuses/Orbits: The visualized paranasal sinuses are essentially clear. The mastoid air cells are unopacified. Other: None. IMPRESSION: Normal head CT. Electronically Signed   By: Julian Hy M.D.   On: 07/31/2021 22:25    Procedures Procedures   Medications Ordered in ED Medications  LORazepam (ATIVAN) tablet 0.5 mg (has no administration in time range)  LORazepam (ATIVAN) injection 0.5 mg (has no administration in time range)  LORazepam (ATIVAN) injection 1 mg (has no administration in time range)  cephALEXin (KEFLEX) capsule 500 mg (500 mg Oral Given 07/31/21 2335)    ED Course  I have reviewed the triage vital signs and the nursing notes.  Pertinent labs & imaging results that were available during my care of the patient were reviewed by me and considered in my medical decision making (see chart for details).    MDM Rules/Calculators/A&P                           70 year old female with medical history significant for depression, GERD, hyperglycemia, presenting to the emergency department after a vaccine reaction.  The patient was administered the shingles shot 2 days ago.  She states that immediately after the shot she developed diffuse body aches, weakness and fatigue that was generalized.  For the past day she has had new shortness of breath.  She endorses radicular pain down her left leg that radiates down from her back.  She felt weak and dropped a fork while holding it with her right hand.  For the past 2 days  she has had some difficulty with ambulation due to generalized fatigue and weakness.  She was seen at urgent care earlier today and referred to the emergency department for further evaluation due to concern for possible GBS.  On arrival, the patient was afebrile, not tachycardic or tachypneic, hypertensive BP 182/77, saturating 97% on room air.  Physical exam significant for acute neurologic deficits of unclear timeframe.  Patient has had some difficulty with ambulation for the past 2 days.  Her neurologic exam was concerning for left hemibody decreased sensation to light touch compared to the right, both in the left upper extremity and left lower extremity.  Additionally, she has 4 out of 5 strength in the left lower extremity.  She presents with radicular pain, generalized muscle aches and fatigue after receiving the shingles vaccine on Wednesday.  Some concern in urgent care for again Barr syndrome although the patient has 2+ patellar reflexes bilaterally.  Her asymmetry in strength with left hemibody numbness and weakness is more concerning for subacute stroke to me.  Additionally, the patient presents with sharp mid chest substernal pain with no radiation.  EKG on arrival without ischemic changes.  Chest x-ray without acute cardiac or pulmonary abnormality, CT head without evidence of intracranial abnormality.  CBC without a leukocytosis, anemia or platelet abnormality, D-dimer negative, initial troponin 5, BNP normal, CMP without electrolyte abnormality, normal renal and liver function.  No evidence for pneumonia, ACS, PE, pneumothorax, symptoms not consistent with pericarditis.  Troponins negative, no evidence for myocarditis.  Finally, the patient does appear to have a surrounding cellulitis versus inflammatory response to the vaccine in the area where her vaccine was administered.  She has no systemic signs of infection.  Likely okay to treat with oral antibiotics.  Patient administered oral  Keflex.  Given the patient's neurologic exam, I am more concerned for subacute stroke although GBS certainly remains on the differential given the patient's recent vaccine administration although considered be less likely given the patient's 2+ patellar reflexes, asymmetric findings on neurologic exam.  Teleneurology consulted for further recommendations.  Following evaluation, Dr. Marvel Plan of neurology recommended admission for stroke work-up with MRI brain without, MRI cervical spine without, continue home aspirin.  Dr. Tonie Griffith of hospitalist medicine was consulted for admission and accepted the patient in admission.  Patient subsequently admitted in stable condition.  Final Clinical Impression(s) / ED Diagnoses Final diagnoses:  Weakness  Sciatica of left side  Adverse effect of vaccine, initial encounter    Rx / DC Orders ED Discharge Orders     None        Regan Lemming, MD 08/01/21 [phone]

## 2021-07-31 NOTE — ED Triage Notes (Signed)
Pt presents to ED POV. Pt c/o L aches and pain. Pt reports that she has been feeling bad since having her shingles vax on Wednesday. Pt set here from UC to r/o guillian-barre syndrome.

## 2021-08-01 ENCOUNTER — Encounter (HOSPITAL_COMMUNITY): Payer: Self-pay | Admitting: Family Medicine

## 2021-08-01 ENCOUNTER — Observation Stay
Admission: RE | Admit: 2021-08-01 | Discharge status: Against Medical Advice | Payer: Medicare Other | Source: Ambulatory Visit

## 2021-08-01 ENCOUNTER — Observation Stay (HOSPITAL_BASED_OUTPATIENT_CLINIC_OR_DEPARTMENT_OTHER): Payer: Medicare Other

## 2021-08-01 ENCOUNTER — Observation Stay (HOSPITAL_COMMUNITY): Payer: Medicare Other

## 2021-08-01 DIAGNOSIS — I340 Nonrheumatic mitral (valve) insufficiency: Secondary | ICD-10-CM

## 2021-08-01 DIAGNOSIS — R531 Weakness: Secondary | ICD-10-CM

## 2021-08-01 DIAGNOSIS — R29818 Other symptoms and signs involving the nervous system: Secondary | ICD-10-CM | POA: Diagnosis not present

## 2021-08-01 DIAGNOSIS — K219 Gastro-esophageal reflux disease without esophagitis: Secondary | ICD-10-CM | POA: Diagnosis not present

## 2021-08-01 DIAGNOSIS — I639 Cerebral infarction, unspecified: Secondary | ICD-10-CM | POA: Diagnosis not present

## 2021-08-01 DIAGNOSIS — D649 Anemia, unspecified: Secondary | ICD-10-CM | POA: Diagnosis not present

## 2021-08-01 DIAGNOSIS — F411 Generalized anxiety disorder: Secondary | ICD-10-CM

## 2021-08-01 DIAGNOSIS — M5432 Sciatica, left side: Secondary | ICD-10-CM | POA: Diagnosis not present

## 2021-08-01 DIAGNOSIS — M549 Dorsalgia, unspecified: Secondary | ICD-10-CM | POA: Diagnosis present

## 2021-08-01 DIAGNOSIS — R0602 Shortness of breath: Secondary | ICD-10-CM | POA: Diagnosis not present

## 2021-08-01 DIAGNOSIS — Z79899 Other long term (current) drug therapy: Secondary | ICD-10-CM | POA: Diagnosis not present

## 2021-08-01 DIAGNOSIS — T50Z95A Adverse effect of other vaccines and biological substances, initial encounter: Secondary | ICD-10-CM | POA: Diagnosis not present

## 2021-08-01 DIAGNOSIS — M4802 Spinal stenosis, cervical region: Secondary | ICD-10-CM | POA: Diagnosis not present

## 2021-08-01 DIAGNOSIS — Z7982 Long term (current) use of aspirin: Secondary | ICD-10-CM | POA: Diagnosis not present

## 2021-08-01 DIAGNOSIS — I361 Nonrheumatic tricuspid (valve) insufficiency: Secondary | ICD-10-CM | POA: Diagnosis not present

## 2021-08-01 DIAGNOSIS — R7303 Prediabetes: Secondary | ICD-10-CM | POA: Diagnosis not present

## 2021-08-01 DIAGNOSIS — Z20822 Contact with and (suspected) exposure to covid-19: Secondary | ICD-10-CM | POA: Diagnosis not present

## 2021-08-01 DIAGNOSIS — R9431 Abnormal electrocardiogram [ECG] [EKG]: Secondary | ICD-10-CM | POA: Diagnosis not present

## 2021-08-01 LAB — MAGNESIUM: Magnesium: 2.3 mg/dL (ref 1.7–2.4)

## 2021-08-01 LAB — CK: Total CK: 69 U/L (ref 38–234)

## 2021-08-01 LAB — ECHOCARDIOGRAM COMPLETE
AR max vel: 2.4 cm2
AV Area VTI: 2.31 cm2
AV Area mean vel: 2.25 cm2
AV Mean grad: 6 mmHg
AV Peak grad: 10.4 mmHg
Ao pk vel: 1.61 m/s
Area-P 1/2: 3.03 cm2
Height: 64 in
MV VTI: 2.23 cm2
S' Lateral: 3 cm
Weight: 2691.38 oz

## 2021-08-01 LAB — CBC WITH DIFFERENTIAL/PLATELET
Abs Immature Granulocytes: 0.03 10*3/uL (ref 0.00–0.07)
Basophils Absolute: 0 10*3/uL (ref 0.0–0.1)
Basophils Relative: 1 %
Eosinophils Absolute: 0.2 10*3/uL (ref 0.0–0.5)
Eosinophils Relative: 3 %
HCT: 40.1 % (ref 36.0–46.0)
Hemoglobin: 12.9 g/dL (ref 12.0–15.0)
Immature Granulocytes: 1 %
Lymphocytes Relative: 40 %
Lymphs Abs: 2.2 10*3/uL (ref 0.7–4.0)
MCH: 28.1 pg (ref 26.0–34.0)
MCHC: 32.2 g/dL (ref 30.0–36.0)
MCV: 87.4 fL (ref 80.0–100.0)
Monocytes Absolute: 0.6 10*3/uL (ref 0.1–1.0)
Monocytes Relative: 10 %
Neutro Abs: 2.6 10*3/uL (ref 1.7–7.7)
Neutrophils Relative %: 45 %
Platelets: 265 10*3/uL (ref 150–400)
RBC: 4.59 MIL/uL (ref 3.87–5.11)
RDW: 13.3 % (ref 11.5–15.5)
WBC: 5.5 10*3/uL (ref 4.0–10.5)
nRBC: 0 % (ref 0.0–0.2)

## 2021-08-01 LAB — RESP PANEL BY RT-PCR (FLU A&B, COVID) ARPGX2
Influenza A by PCR: NEGATIVE
Influenza B by PCR: NEGATIVE
SARS Coronavirus 2 by RT PCR: NEGATIVE

## 2021-08-01 LAB — LIPID PANEL
Cholesterol: 188 mg/dL (ref 0–200)
HDL: 69 mg/dL (ref >40)
LDL Cholesterol: 104 mg/dL — ABNORMAL HIGH (ref 0–99)
Total CHOL/HDL Ratio: 2.7 RATIO
Triglycerides: 73 mg/dL (ref <150)
VLDL: 15 mg/dL (ref 0–40)

## 2021-08-01 LAB — COMPREHENSIVE METABOLIC PANEL
ALT: 32 U/L (ref 0–44)
AST: 23 U/L (ref 15–41)
Albumin: 3.9 g/dL (ref 3.5–5.0)
Alkaline Phosphatase: 57 U/L (ref 38–126)
Anion gap: 10 (ref 5–15)
BUN: 10 mg/dL (ref 8–23)
CO2: 25 mmol/L (ref 22–32)
Calcium: 9 mg/dL (ref 8.9–10.3)
Chloride: 105 mmol/L (ref 98–111)
Creatinine, Ser: 0.59 mg/dL (ref 0.44–1.00)
GFR, Estimated: >60 mL/min (ref >60)
Glucose, Bld: 117 mg/dL — ABNORMAL HIGH (ref 70–99)
Potassium: 4.1 mmol/L (ref 3.5–5.1)
Sodium: 140 mmol/L (ref 135–145)
Total Bilirubin: 0.8 mg/dL (ref 0.3–1.2)
Total Protein: 7.1 g/dL (ref 6.5–8.1)

## 2021-08-01 LAB — TSH: TSH: 2.93 u[IU]/mL (ref 0.350–4.500)

## 2021-08-01 LAB — HEMOGLOBIN A1C
Hgb A1c MFr Bld: 6.4 % — ABNORMAL HIGH (ref 4.8–5.6)
Mean Plasma Glucose: 136.98 mg/dL

## 2021-08-01 MED ORDER — STROKE: EARLY STAGES OF RECOVERY BOOK: Freq: Once | Status: AC

## 2021-08-01 MED ORDER — LORAZEPAM 2 MG/ML IJ SOLN: 1.0000 mg | INTRAMUSCULAR | Status: DC | PRN

## 2021-08-01 MED ORDER — GADOBUTROL 1 MMOL/ML IV SOLN
7.5000 mL | Freq: Once | INTRAVENOUS | Status: AC | PRN
  Administered 2021-08-01: 7.5 mL via INTRAVENOUS

## 2021-08-01 MED ORDER — ASPIRIN 81 MG PO CHEW
81.0000 mg | CHEWABLE_TABLET | Freq: Every day | ORAL | Status: DC
  Administered 2021-08-01 – 2021-08-02 (×2): 81 mg via ORAL
  Filled 2021-08-01 (×2): qty 1

## 2021-08-01 MED ORDER — ACETAMINOPHEN 325 MG PO TABS
650.0000 mg | ORAL_TABLET | Freq: Four times a day (QID) | ORAL | Status: DC | PRN
  Administered 2021-08-01 – 2021-08-02 (×4): 650 mg via ORAL
  Filled 2021-08-01 (×4): qty 2

## 2021-08-01 MED ORDER — ACETAMINOPHEN 650 MG RE SUPP: 650.0000 mg | Freq: Four times a day (QID) | RECTAL | Status: DC | PRN

## 2021-08-01 MED ORDER — LORAZEPAM 0.5 MG PO TABS: 0.5000 mg | ORAL_TABLET | ORAL | Status: DC | PRN

## 2021-08-01 MED ORDER — ALPRAZOLAM 0.5 MG PO TABS
0.5000 mg | ORAL_TABLET | Freq: Every evening | ORAL | Status: DC | PRN
  Administered 2021-08-01: 0.5 mg via ORAL
  Filled 2021-08-01: qty 1

## 2021-08-01 MED ORDER — PANTOPRAZOLE SODIUM 40 MG PO TBEC
40.0000 mg | DELAYED_RELEASE_TABLET | Freq: Two times a day (BID) | ORAL | Status: DC
  Administered 2021-08-01 – 2021-08-02 (×3): 40 mg via ORAL
  Filled 2021-08-01 (×3): qty 1

## 2021-08-01 MED ORDER — PERFLUTREN LIPID MICROSPHERE
1.0000 mL | INTRAVENOUS | Status: AC | PRN
  Administered 2021-08-01: 4 mL via INTRAVENOUS
  Filled 2021-08-01: qty 10

## 2021-08-01 MED ORDER — LORAZEPAM 2 MG/ML IJ SOLN: 0.5000 mg | INTRAMUSCULAR | Status: DC | PRN

## 2021-08-01 MED ORDER — MELATONIN 5 MG PO TABS
10.0000 mg | ORAL_TABLET | Freq: Every day | ORAL | Status: DC
  Administered 2021-08-01: 10 mg via ORAL
  Filled 2021-08-01: qty 2

## 2021-08-01 NOTE — Progress Notes (Signed)
PT Cancellation Note  Patient Details Name: KIMBERLI WINNE MRN: [id number] DOB: Nov 21, 1950   Cancelled Treatment:    Reason Eval/Treat Not Completed: Patient at procedure or test/unavailable; pt at MRI, will attempt later   [name] 08/01/2021, 10:06 AM Magda Kiel, PT Acute Rehabilitation Services Pager:[phone] Office:[phone] 08/01/2021

## 2021-08-01 NOTE — H&P (Signed)
History and Physical    PLEASE NOTE THAT DRAGON DICTATION SOFTWARE WAS USED IN THE CONSTRUCTION OF THIS NOTE.   [name] QAS:[id number] DOB: 1951-07-28 DOA: 07/31/2021  PCP: Abner Greenspan, MD  Patient coming from: home   I have personally briefly reviewed patient's old medical records in [place]  Chief Complaint: Left-sided weakness  HPI: [name] is a 70 y.o. female with medical history significant for GERD, generalized anxiety disorder who is admitted to [facility] on 07/31/2021 by way of transfer from [facility] ED for further stroke/TIA work-up after presenting from home to the latter facility complaining left-sided weakness  Patient reports new onset of weakness in the left upper and left lower extremities on 07/30/2021, with associated paresthesias involving the upper and lower extremities on the left.  Denies any associated dysarthria, facial droop, acute change in vision, dysphagia, vertigo.  No preceding fall.  Denies any associated headache, chest pain, nausea, vomiting palpitations, diaphoresis.  Denies any known history of hypertension, hyperlipidemia, atrial fibrillation, obstructive sleep apnea, diabetes.  Is on a daily baby aspirin as result patient blood thinner.  Not on a statin.      ED Course:  Vital signs in the ED were notable for the following: Afebrile; heart rate 79-89; blood pressure 145/76 -171/78; respiratory 15-18, oxygen saturation 97 to 98% on room air.  Imaging and additional notable ED work-up: CT head showed evidence of acute intracranial process, including no evidence of intracranial hemorrhage  Case discussed with the on-call neurologist, Dr. Marvel Plan, who recommended admission to the hospital service at [facility] for further evaluation management stroke/TIA work-up, including recommendations for pursuit of MRI brain, MRA head and neck.   Review of Systems: As per HPI otherwise 10 point review of systems negative.    Past Medical History:  Diagnosis Date   Depression    Fatty liver    Fibrocystic disease of both breasts    GERD (gastroesophageal reflux disease)    Hyperglycemia    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   On continue HRT since 1980 per patient   Sphincter of Oddi dysfunction     Past Surgical History:  Procedure Laterality Date   ABDOMINAL HYSTERECTOMY     CARPAL TUNNEL RELEASE  08/2008   left   ERCP     LEFT HEART CATHETERIZATION WITH CORONARY ANGIOGRAM N/A 06/08/2013   Procedure: LEFT HEART CATHETERIZATION WITH CORONARY ANGIOGRAM;  Surgeon: Ramond Dial, MD;  Location: [facility] CATH LAB;  Service: Cardiovascular;  Laterality: N/A;   MASTECTOMY     bilateral for severe macrocystic breast   SPINE SURGERY N/A 02/12/2020   neck    Social History:  reports that she has never smoked. She has never used smokeless tobacco. She reports current alcohol use. She reports that she does not use drugs.   Allergies  Allergen Reactions   Atorvastatin Other (See Comments)    REACTION: rash   Ezetimibe Other (See Comments)    REACTION: muscle aches   Polyoxyethylene 40 Sorbitol Septaoleate [Sorbitan] Other (See Comments)   Prednisone Other (See Comments)    **INJECTIONS ONLY** caused tachycardia    Sulfamethoxazole-Trimethoprim Other (See Comments)    Swelling of nostrils    Family History  Problem Relation Age of Onset   Brain cancer Mother        hospice   Diabetes Father    Hypertension Father    Cancer Father        prostate and  leukemia   Breast cancer Sister 30   Cancer Sister    Cancer Maternal Aunt        Pancreatic   Diabetes Maternal Uncle    Heart disease Maternal Grandmother      Prior to Admission medications   Medication Sig Start Date End Date Taking? Authorizing Provider  acetaminophen (TYLENOL) 500 MG tablet Take 500 mg by mouth at bedtime.    [provider]  ALPRAZolam Duanne Moron) 1 MG tablet TAKE 1/2 TABLET BY MOUTH AT BEDTIME AS  NEEDED FOR ANXIETY OR SLEEP 07/28/21   Tower, Wynelle Fanny, MD  ASPERCREME LIDOCAINE EX Apply 1 application topically daily as needed.    [provider]  ASPIRIN PO Take 81 mg by mouth daily.    [provider]  Calcium-Magnesium-Vitamin D (CALCIUM 1200+D3 PO) Take 1 tablet by mouth at bedtime.    [provider]  diclofenac Sodium (VOLTAREN) 1 % GEL APPLY 4 GRAMS TO AFFECTED AREA 4 TIMES DAILY 05/18/21   Tower, Wynelle Fanny, MD  MELATONIN PO Take 10 mg by mouth daily.    [provider]  methocarbamol (ROBAXIN) 750 MG tablet Take 750 mg by mouth at bedtime. 06/27/20   [provider]  Multiple Vitamins-Minerals (EYE VITAMINS PO) Take 1 capsule by mouth daily.    [provider]  Omega-3 Fatty Acids (OMEGA-3 FISH OIL PO) Take 1,000 mg by mouth daily.    [provider]  pantoprazole (PROTONIX) 40 MG tablet Take 1 tablet (40 mg total) by mouth 2 (two) times daily. 07/20/21   Tower, Wynelle Fanny, MD  triamcinolone cream (KENALOG) 0.1 % Apply 1 application topically 2 (two) times daily as needed. rash 07/20/21   Abner Greenspan, MD     Objective    Physical Exam: Vitals:   08/01/21 0000 08/01/21 0040 08/01/21 0115 08/01/21 0351  BP: (!) 175/78 (!) 134/108 (!) 183/97 (!) 149/76  Pulse: 84 79 80 74  Resp: 15 17 (!) 22 13  Temp:      SpO2: 98% 97% 98% 95%    General: appears to be stated age; alert, oriented Skin: warm, dry, no rash Head:  AT/[place] Mouth:  Oral mucosa membranes appear moist, normal dentition Neck: supple; trachea midline Heart:  RRR; did not appreciate any M/R/G Lungs: CTAB, did not appreciate any wheezes, rales, or rhonchi Abdomen: + BS; soft, ND, NT Vascular: 2+ pedal pulses b/l; 2+ radial pulses b/l Extremities: no peripheral edema, no muscle wasting Neuro: 4 out of 5 strength in the left upper and lower extremity 5 out of 5 strength in right upper and lower extremity.  Diminished sensation to light touch in the left upper  and lower extremity, well preserved in right upper and lower extremities. cranial nerves II through XII grossly intact; no pronator drift; no evidence suggestive of slurred speech, dysarthria, or facial droop; Normal muscle tone. No tremors.    Labs on Admission: I have personally reviewed following labs and imaging studies  CBC: Recent Labs  Lab 07/31/21 2130  WBC 6.4  NEUTROABS 3.3  HGB 13.1  HCT 40.9  MCV 87.6  PLT 834   Basic Metabolic Panel: Recent Labs  Lab 07/31/21 2130  NA 143  K 3.8  CL 107  CO2 25  GLUCOSE 145*  BUN 12  CREATININE 0.56  CALCIUM 9.2   GFR: Estimated Creatinine Clearance: 64.6 mL/min (by C-G formula based on SCr of 0.56 mg/dL). Liver Function Tests: Recent Labs  Lab 07/31/21 2130  AST 18  ALT 30  ALKPHOS 55  BILITOT 0.5  PROT 7.3  ALBUMIN 4.6   No results for input(s): LIPASE, AMYLASE in the last 168 hours. No results for input(s): AMMONIA in the last 168 hours. Coagulation Profile: Recent Labs  Lab 07/31/21 2130  INR 1.0   Cardiac Enzymes: Recent Labs  Lab 07/31/21 0010  CKTOTAL 69   BNP (last 3 results) No results for input(s): PROBNP in the last 8760 hours. HbA1C: No results for input(s): HGBA1C in the last 72 hours. CBG: Recent Labs  Lab 07/31/21 2133  GLUCAP 143*   Lipid Profile: No results for input(s): CHOL, HDL, LDLCALC, TRIG, CHOLHDL, LDLDIRECT in the last 72 hours. Thyroid Function Tests: No results for input(s): TSH, T4TOTAL, FREET4, T3FREE, THYROIDAB in the last 72 hours. Anemia Panel: No results for input(s): VITAMINB12, FOLATE, FERRITIN, TIBC, IRON, RETICCTPCT in the last 72 hours. Urine analysis: No results found for: COLORURINE, APPEARANCEUR, [place], [place], GLUCOSEU, HGBUR, BILIRUBINUR, KETONESUR, PROTEINUR, UROBILINOGEN, NITRITE, LEUKOCYTESUR  Radiological Exams on Admission: DG Chest 2 View  Result Date: 07/31/2021 CLINICAL DATA:  Shortness of breath EXAM: CHEST - 2 VIEW COMPARISON:  None.  FINDINGS: The heart size and mediastinal contours are within normal limits. Both lungs are clear. Hardware in the cervical spine. IMPRESSION: No active cardiopulmonary disease. Electronically Signed   By: Donavan Foil M.D.   On: 07/31/2021 22:26   CT Head Wo Contrast  Result Date: 07/31/2021 CLINICAL DATA:  Headache EXAM: CT HEAD WITHOUT CONTRAST TECHNIQUE: Contiguous axial images were obtained from the base of the skull through the vertex without intravenous contrast. COMPARISON:  None. FINDINGS: Brain: No evidence of acute infarction, hemorrhage, hydrocephalus, extra-axial collection or mass lesion/mass effect. Vascular: No hyperdense vessel or unexpected calcification. Skull: Normal. Negative for fracture or focal lesion. Sinuses/Orbits: The visualized paranasal sinuses are essentially clear. The mastoid air cells are unopacified. Other: None. IMPRESSION: Normal head CT. Electronically Signed   By: Julian Hy M.D.   On: 07/31/2021 22:25      Assessment/Plan   Principal Problem:   Acute left-sided weakness Active Problems:   GERD   GAD (generalized anxiety disorder)    #) Left hemiparesis: Acute onset 2 days ago and associated with left-sided paresthesias, unilateral presentation warranting stroke work-up per consultation via neurology, including recommendations for MRI brain, MRA head and neck.  Does not appear to possess any overt modifiable acute CVA risk factors, in the absence of any history of hypertension hyperlipidemia, atrial fibrillation obstructive sleep apnea, type 2 diabetes mellitus.  Plan: Presenting CT head showed no evidence of acute intracranial process.  MRI brain, MRA head and been ordered, with results currently pending.  Plan: Follow-up results of MRI brain MRA head.  Add on MRA neck.  Check echo .  Serial neurochecks per protocol.  NPO until patient has passed nursing bedside swallow screen.  Monitor on telemetry.  Lipid panel.  Hemoglobin C.  PT/OT consults been  placed for the morning.  Neurology formally consulted, as above.  Per neurology consult, continue home aspirin.      #) GERD: Documented history of such, on Protonix outpatient.  Plan: Continue Protonix.     #) Generalized anxiety disorder: Documented history of such, prn Xanax at home.   Plan: Continue as needed Xanax.     DVT prophylaxis: SCD's   Code Status: Full code Family Communication: none Disposition Plan: Per Rounding Team Consults called: ; on-call neurology consulted, as further detailed above;  Admission status: obs; med-tele  PLEASE NOTE THAT DRAGON DICTATION SOFTWARE WAS USED IN THE CONSTRUCTION OF THIS NOTE.   [place] DO [facility]  From [place]   08/01/2021, 4:00 AM

## 2021-08-01 NOTE — Progress Notes (Signed)
PROGRESS NOTE    [name] [facility]  VOH:[id number] DOB: June 14, 1951 DOA: 07/31/2021 PCP: Abner Greenspan, MD   Brief Narrative:  [name] is a 70 y.o. female with medical history significant for GERD, generalized anxiety disorder presenting from home who is admitted to [facility] on 07/31/2021 for further stroke/TIA work-up after presenting from home complaining left-sided weakness of both upper and lower extremities, patient denies concurrent dysarthria, facial droop, acute change in vision, dysphagia, vertigo.  Assessment & Plan:   Left hemiparesis - rule out CVA -Neurology following, appreciate insight recommendations- -Initial imaging appears to be negative for recent infarct hemorrhage mass vessel occlusion stenosis or dissection. -PT OT to follow  GERD -Continue PPI  Anxiety, chronic -Continue home Xanax  DVT prophylaxis: SCDs, early ambulation Code Status: Full Family Communication: None present  Status is: Observation  Dispo: The patient is from: Home              Anticipated d/c is to: Home              Anticipated d/c date is: 24 to 48 hours              Patient currently not medically stable for discharge  Consultants:  Neurology  Procedures:  None  Antimicrobials:  None  Subjective: No acute issues or events overnight  Objective: Vitals:   08/01/21 0040 08/01/21 0115 08/01/21 0351 08/01/21 0416  BP: (!) 134/108 (!) 183/97 (!) 149/76   Pulse: 79 80 74   Resp: 17 (!) 22 13   Temp:   98.2 F (36.8 C)   TempSrc:   Oral   SpO2: 97% 98% 95%   Weight:    76.3 kg  Height:    5\' 4"  (1.626 m)   No intake or output data in the 24 hours ending 08/01/21 0753 Filed Weights   08/01/21 0416  Weight: 76.3 kg    Examination:  General:  Pleasantly resting in bed, No acute distress. HEENT:  Normocephalic atraumatic.  Sclerae nonicteric, noninjected.  Extraocular movements intact bilaterally. Neck:  Without mass or deformity.  Trachea is  midline. Lungs:  Clear to auscultate bilaterally without rhonchi, wheeze, or rales. Heart:  Regular rate and rhythm.  Without murmurs, rubs, or gallops. Abdomen:  Soft, nontender, nondistended.  Without guarding or rebound. Extremities: Without cyanosis, clubbing, edema, or obvious deformity. Vascular:  Dorsalis pedis and posterior tibial pulses palpable bilaterally. Skin:  Warm and dry, no erythema, no ulcerations.  Data Reviewed: I have personally reviewed following labs and imaging studies  CBC: Recent Labs  Lab 07/31/21 2130 08/01/21 0444  WBC 6.4 5.5  NEUTROABS 3.3 2.6  HGB 13.1 12.9  HCT 40.9 40.1  MCV 87.6 87.4  PLT 270 694   Basic Metabolic Panel: Recent Labs  Lab 07/31/21 2130 08/01/21 0444  NA 143 140  K 3.8 4.1  CL 107 105  CO2 25 25  GLUCOSE 145* 117*  BUN 12 10  CREATININE 0.56 0.59  CALCIUM 9.2 9.0  MG  --  2.3   GFR: Estimated Creatinine Clearance: 64.5 mL/min (by C-G formula based on SCr of 0.59 mg/dL). Liver Function Tests: Recent Labs  Lab 07/31/21 2130 08/01/21 0444  AST 18 23  ALT 30 32  ALKPHOS 55 57  BILITOT 0.5 0.8  PROT 7.3 7.1  ALBUMIN 4.6 3.9   No results for input(s): LIPASE, AMYLASE in the last 168 hours. No results for input(s): AMMONIA in the last 168 hours. Coagulation Profile:  Recent Labs  Lab 07/31/21 2130  INR 1.0   Cardiac Enzymes: Recent Labs  Lab 07/31/21 0010  CKTOTAL 69   BNP (last 3 results) No results for input(s): PROBNP in the last 8760 hours. HbA1C: Recent Labs    08/01/21 0444  HGBA1C 6.4*   CBG: Recent Labs  Lab 07/31/21 2133  GLUCAP 143*   Lipid Profile: Recent Labs    08/01/21 0444  CHOL 188  HDL 69  LDLCALC 104*  TRIG 73  CHOLHDL 2.7   Thyroid Function Tests: No results for input(s): TSH, T4TOTAL, FREET4, T3FREE, THYROIDAB in the last 72 hours. Anemia Panel: No results for input(s): VITAMINB12, FOLATE, FERRITIN, TIBC, IRON, RETICCTPCT in the last 72 hours. Sepsis Labs: No  results for input(s): PROCALCITON, LATICACIDVEN in the last 168 hours.  Recent Results (from the past 240 hour(s))  Resp Panel by RT-PCR (Flu A&B, Covid) Nasopharyngeal Swab     Status: None   Collection Time: 08/01/21 12:44 AM   Specimen: Nasopharyngeal Swab; Nasopharyngeal(NP) swabs in vial transport medium  Result Value Ref Range Status   SARS Coronavirus 2 by RT PCR NEGATIVE NEGATIVE Final    Comment: (NOTE) SARS-CoV-2 target nucleic acids are NOT DETECTED.  The SARS-CoV-2 RNA is generally detectable in upper respiratory specimens during the acute phase of infection. The lowest concentration of SARS-CoV-2 viral copies this assay can detect is 138 copies/mL. A negative result does not preclude SARS-Cov-2 infection and should not be used as the sole basis for treatment or other patient management decisions. A negative result may occur with  improper specimen collection/handling, submission of specimen other than nasopharyngeal swab, presence of viral mutation(s) within the areas targeted by this assay, and inadequate number of viral copies(<138 copies/mL). A negative result must be combined with clinical observations, patient history, and epidemiological information. The expected result is Negative.  Fact Sheet for Patients:  EntrepreneurPulse.com.au  Fact Sheet for Healthcare Providers:  IncredibleEmployment.be  This test is no t yet approved or cleared by the [place] FDA and  has been authorized for detection and/or diagnosis of SARS-CoV-2 by FDA under an Emergency Use Authorization (EUA). This EUA will remain  in effect (meaning this test can be used) for the duration of the COVID-19 declaration under Section 564(b)(1) of the Act, 21 U.S.C.section 360bbb-3(b)(1), unless the authorization is terminated  or revoked sooner.       Influenza A by PCR NEGATIVE NEGATIVE Final   Influenza B by PCR NEGATIVE NEGATIVE Final    Comment:  (NOTE) The Xpert Xpress SARS-CoV-2/FLU/RSV plus assay is intended as an aid in the diagnosis of influenza from Nasopharyngeal swab specimens and should not be used as a sole basis for treatment. Nasal washings and aspirates are unacceptable for Xpert Xpress SARS-CoV-2/FLU/RSV testing.  Fact Sheet for Patients: EntrepreneurPulse.com.au  Fact Sheet for Healthcare Providers: IncredibleEmployment.be  This test is not yet approved or cleared by the [place] FDA and has been authorized for detection and/or diagnosis of SARS-CoV-2 by FDA under an Emergency Use Authorization (EUA). This EUA will remain in effect (meaning this test can be used) for the duration of the COVID-19 declaration under Section 564(b)(1) of the Act, 21 U.S.C. section 360bbb-3(b)(1), unless the authorization is terminated or revoked.  Performed at [facility], [address] [place], [place] [address]     Radiology Studies: DG Chest 2 View  Result Date: 07/31/2021 CLINICAL DATA:  Shortness of breath EXAM: CHEST - 2 VIEW COMPARISON:  None. FINDINGS: The heart size  and mediastinal contours are within normal limits. Both lungs are clear. Hardware in the cervical spine. IMPRESSION: No active cardiopulmonary disease. Electronically Signed   By: Donavan Foil M.D.   On: 07/31/2021 22:26   CT Head Wo Contrast  Result Date: 07/31/2021 CLINICAL DATA:  Headache EXAM: CT HEAD WITHOUT CONTRAST TECHNIQUE: Contiguous axial images were obtained from the base of the skull through the vertex without intravenous contrast. COMPARISON:  None. FINDINGS: Brain: No evidence of acute infarction, hemorrhage, hydrocephalus, extra-axial collection or mass lesion/mass effect. Vascular: No hyperdense vessel or unexpected calcification. Skull: Normal. Negative for fracture or focal lesion. Sinuses/Orbits: The visualized paranasal sinuses are essentially clear. The mastoid air cells are  unopacified. Other: None. IMPRESSION: Normal head CT. Electronically Signed   By: Julian Hy M.D.   On: 07/31/2021 22:25     Scheduled Meds:  aspirin  81 mg Oral Daily   pantoprazole  40 mg Oral BID    LOS: 0 days   Time spent: 18min  Paydon Carll C Evertt Chouinard, DO [facility]  If 7PM-7AM, please contact night-coverage [email or web address]  08/01/2021, 7:53 AM

## 2021-08-01 NOTE — Evaluation (Signed)
Occupational Therapy Evaluation Patient Details Name: [name] MRN: [id number] DOB: August 29, 1951 Today's Date: 08/01/2021   History of Present Illness [name] is a 70 y.o. female presenting with concern for Guillain-Barr vs. stroke following receiving a shingles shot 2 days ago, presents with LUE pain, new L sciatica and SOB. 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 She lives in a 1 level home, level entry with 1 small step to manage. Upon evaluation pt demonstrated independence for functional mobility within the room and ADLs. She has paraesthesias in the ulnar aspect of her L hand and forearm but strength and ROM are WFL. Otherwise pt is at her baseline. She does not have further acute OT needs. Recommend d/c home.      Recommendations for follow up therapy are one component of a multi-disciplinary discharge planning process, led by the attending physician.  Recommendations may be updated based on patient status, additional functional criteria and insurance authorization.   Follow Up Recommendations  No OT follow up    Assistance Recommended at Discharge None  Functional Status Assessment  Patient has had a recent decline in their functional status and demonstrates the ability to make significant improvements in function in a reasonable and predictable amount of time.  Equipment Recommendations  None recommended by OT       Precautions / Restrictions Precautions Precautions: Fall      Mobility Bed Mobility Overal bed mobility: Independent        Transfers Overall transfer level: Independent Equipment used: None            Balance Overall balance assessment: Independent                   ADL either performed or assessed with clinical judgement   ADL Overall ADL's : Independent;At baseline         General ADL Comments: pt is indep wtih ADLs no AD needed      Vision Baseline Vision/History: 1 Wears glasses Ability to See in Adequate Light: 0 Adequate Vision Assessment?: No apparent visual deficits      Pertinent Vitals/Pain Pain Assessment: No/denies pain     Hand Dominance Right   Extremity/Trunk Assessment Upper Extremity Assessment Upper Extremity Assessment: LUE deficits/detail LUE Deficits / Details: overall WFL. ulnar distribution parasthesias LUE Sensation: decreased light touch LUE Coordination: WNL   Lower Extremity Assessment Lower Extremity Assessment: Defer to PT evaluation   Cervical / Trunk Assessment Cervical / Trunk Assessment: Normal   Communication Communication Communication: No difficulties   Cognition Arousal/Alertness: Awake/alert Behavior During Therapy: WFL for tasks assessed/performed Overall Cognitive Status: Within Functional Limits for tasks assessed               General Comments  VSS on RA, husband present and supportive     Home Living Family/patient expects to be discharged to:: Private residence Living Arrangements: Spouse/significant other Available Help at Discharge: Family;Available 24 hours/day Type of Home: House Home Access: Level entry     Home Layout: One level (1 step to manage)     Bathroom Shower/Tub: Tub/shower unit;Walk-in shower   Bathroom Toilet: Handicapped height     Home Equipment: Tub bench          Prior Functioning/Environment Prior Level of Function : Independent/Modified Independent             Mobility Comments: no AD, pt walks for exercise  30 min/day ADLs Comments: indep, drives, retired Therapist, sports fro [employer]        OT Problem List: Decreased activity tolerance;Impaired UE functional use      OT Treatment/Interventions:      OT Goals(Current goals can be found in the care plan section) Acute Rehab OT Goals Patient Stated Goal: home today OT Goal Formulation: All assessment and education complete, DC therapy   AM-PAC OT "6 Clicks"  Daily Activity     Outcome Measure Help from another person eating meals?: None Help from another person taking care of personal grooming?: None Help from another person toileting, which includes using toliet, bedpan, or urinal?: None Help from another person bathing (including washing, rinsing, drying)?: None Help from another person to put on and taking off regular upper body clothing?: None Help from another person to put on and taking off regular lower body clothing?: None 6 Click Score: 24   End of Session Nurse Communication: Mobility status  Activity Tolerance: Patient tolerated treatment well Patient left: in bed;with call bell/phone within reach;with family/visitor present  OT Visit Diagnosis: Hemiplegia and hemiparesis Hemiplegia - Right/Left: Left Hemiplegia - dominant/non-dominant: Non-Dominant Hemiplegia - caused by: Unspecified                Time: 1583-0940 OT Time Calculation (min): 10 min Charges:  OT General Charges $OT Visit: 1 Visit OT Evaluation $OT Eval Low Complexity: 1 Low   Takira Sherrin A Lisamarie Coke 08/01/2021, 4:10 PM

## 2021-08-01 NOTE — Progress Notes (Signed)
OT Cancellation Note  Patient Details Name: [name] MRN: [id number] DOB: Jun 18, 1951   Cancelled Treatment:    Reason Eval/Treat Not Completed: Patient at procedure or test/ unavailable (Pt at MRI; OT evaluation to f/u when appropriate.)  Clarke Amburn A [name] 08/01/2021, 9:53 AM

## 2021-08-01 NOTE — Consult Note (Signed)
[facility] TeleNeurology Consult Services  Stat Consult  Patient Name:   [name], [name] Date of Birth:   1950-12-08 Identification Number:   MRN - [id number] Date of Service:   07/31/2021 22:48:04  Diagnosis:       R20.2 - Paresthesia of skin       R53.1 - Weakness  Impression 70yo woman with history of prior multilevel cervical fusion 1 year ago, CAD, left carpal tunnel syndrome s/p release, prediabetes presenting with left sided weakness and numbness. Not a tPA candidate as last known well is > 4.5 hours out. Concern per consult was for possible post vaccine GBS. Unilateral symptoms would be atypical for AIDP. No clear bulbar findings on exam or by history with the reported exertional shortness of breath- I am doubtful it is neurologic in origin. Symptoms would fit better with lacunar stroke with left hemisensory change and subtle left sided weakness, versus cervical spine pathology. Recommend further evaluation with MRI brain w/o, MRI cervical spine w/o to start. Can check MRA head/neck with MRI. Continue aspirin for now. Would consider medical evaluation for alternate causes of exertional dyspnea as this may potentially not be neurologic in etiology.  Our recommendations are outlined below.  Diagnostic Studies: MRI Brain without contrast MRI C spine without contrast MRA head and neck w/o contrast  Free text medications: continue aspirin  DVT Prophylaxis: SCDs, Pneumatic Compression. Lovenox or LMW Heparin  Disposition: Neurology will follow.   Metrics: [facility] Notification Time: 07/31/2021 22:45:31 Stamp Time: 07/31/2021 22:48:04 Callback Response Time: 07/31/2021 22:54:06   ----------------------------------------------------------------------------------------------------  Chief Complaint: left sided weakness/numbness  History of Present Illness: Patient is a 70 year old Female.  70yo woman with history of prior multilevel cervical fusion 1 year ago,  CAD, left carpal tunnel syndrome s/p release, prediabetes presenting with left sided weakness and numbness. She received shingles vaccine Wednesday. She developed body aches, headaches, generalized weakness, itching, nausea, and diarrhea within 5 hours of receiving the vaccine. Thursday she dropped a fork while eating from her left hand and may have had some subtle left sided weakness. Yesterday she noticed significant exertional shortness of breath even when walking distances to her kitchen and felt continued weakness both generalized and on the left side. Today she woke up with new pain shooting down her left leg that she has never had before. No B/B accidents or incontinence. No difficulty swallowing. No vision changes or double vision. She passed a swallow eval here in the ED per RN. She did have some mild orthopnea ever since her cervical fusion a year ago but reports it has not changed in the past week (has to prop herself up slightly with pillows).    Examination: BP(175/78), Pulse(85), Blood Glucose(145) 1A: Level of Consciousness - Alert; keenly responsive + 0 1B: Ask Month and Age - Both Questions Right + 0 1C: Blink Eyes & Squeeze Hands - Performs Both Tasks + 0 2: Test Horizontal Extraocular Movements - Normal + 0 3: Test Visual Fields - No Visual Loss + 0 4: Test Facial Palsy (Use Grimace if Obtunded) - Normal symmetry + 0 5A: Test Left Arm Motor Drift - No Drift for 10 Seconds + 0 5B: Test Right Arm Motor Drift - No Drift for 10 Seconds + 0 6A: Test Left Leg Motor Drift - No Drift for 5 Seconds + 0 6B: Test Right Leg Motor Drift - No Drift for 5 Seconds + 0 7: Test Limb Ataxia (FNF/Heel-Shin) - No Ataxia + 0 8: Test Sensation - Mild-Moderate  Loss: Less Sharp/More Dull + 1 9: Test Language/Aphasia - Normal; No aphasia + 0 10: Test Dysarthria - Normal + 0 11: Test Extinction/Inattention - No abnormality + 0  NIHSS Score: 1 NIHSS Free Text : Slightly reduced sensation on the left  face, arm, and leg per patient. No drift on the left but grip strength is slightly reduced on the left per RN. Face is symmetric, cheek puff is full, talks in complete sentences, no ptosis with full symmetric eye closure bilaterally     Patient / Family was informed the Neurology Consult would occur via TeleHealth consult by way of interactive audio and video telecommunications and consented to receiving care in this manner.  Patient is being evaluated for possible acute neurologic impairment and high probability of imminent or life - threatening deterioration.I spent total of 40 minutes providing care to this patient, including time for face to face visit via telemedicine, review of medical records, imaging studies and discussion of findings with providers, the patient and / or family.   Dr Annamary Carolin   [facility] [phone]  Case [id number]

## 2021-08-01 NOTE — Consult Note (Signed)
NEURO HOSPITALIST CONSULT NOTE   Requesting physician: Dr. Avon Gully  Reason for Consult: Neurology follow up for MRI studies based on Teleneurology recommendations  History obtained from:   Patient and Chart     HPI:                                                                                                                                          [name] is an 70 y.o. female with a PMHx of generalized anxiety disorder, depression, fatty liver and hyperglycemia who presented initially to [facility] for evaluation of left sided weakness and shortness of breath. She had reported new onset of weakness in the left upper and left lower extremities on 07/30/2021, with associated paresthesias involving the upper and lower extremities on the left. She had no associated facial droop, acute change in vision, dysphagia, dysarthria or vertigo. She denied any known history of hypertension, hyperlipidemia, atrial fibrillation, obstructive sleep apnea, or diabetes. At home she takes ASA daily.    CT head showed no evidence of acute intracranial process, including no evidence of intracranial hemorrhage   The patient was evaluated at [facility] by Teleneurology, with recommendations for admission to the hospital service at [facility] for stroke/TIA work-up, including MRI brain and MRA of head and neck.  Teleneurology note has been reviewed: "70yo woman with history of prior multilevel cervical fusion 1 year ago, CAD, left carpal tunnel syndrome s/p release, prediabetes presenting with left sided weakness and numbness. Not a tPA candidate as last known well is > 4.5 hours out. Concern per consult was for possible post vaccine GBS. Unilateral symptoms would be atypical for AIDP. No clear bulbar findings on exam or by history with the reported exertional shortness of breath- I am doubtful it is neurologic in origin. Symptoms would fit better with lacunar stroke with left hemisensory change and subtle  left sided weakness, versus cervical spine pathology. Recommend further evaluation with MRI brain w/o, MRI cervical spine w/o to start. Can check MRA head/neck with MRI. Continue aspirin for now. Would consider medical evaluation for alternate causes of exertional dyspnea as this may potentially not be neurologic in etiology."  Past Medical History:  Diagnosis Date   Depression    Fatty liver    Fibrocystic disease of both breasts    GERD (gastroesophageal reflux disease)    Hyperglycemia    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   On continue HRT since 1980 per patient   Sphincter of Oddi dysfunction     Past Surgical History:  Procedure Laterality Date   ABDOMINAL HYSTERECTOMY     CARPAL TUNNEL RELEASE  08/2008   left   ERCP     LEFT HEART CATHETERIZATION WITH CORONARY ANGIOGRAM N/A 06/08/2013   Procedure: LEFT HEART CATHETERIZATION  WITH CORONARY ANGIOGRAM;  Surgeon: Ramond Dial, MD;  Location: [facility] CATH LAB;  Service: Cardiovascular;  Laterality: N/A;   MASTECTOMY     bilateral for severe macrocystic breast   SPINE SURGERY N/A 02/12/2020   neck    Family History  Problem Relation Age of Onset   Brain cancer Mother        hospice   Diabetes Father    Hypertension Father    Cancer Father        prostate and leukemia   Breast cancer Sister 23   Cancer Sister    Cancer Maternal Aunt        Pancreatic   Diabetes Maternal Uncle    Heart disease Maternal Grandmother              Social History:  reports that she has never smoked. She has never used smokeless tobacco. She reports current alcohol use. She reports that she does not use drugs.  Allergies  Allergen Reactions   Atorvastatin Other (See Comments)    REACTION: rash   Ezetimibe Other (See Comments)    REACTION: muscle aches   Polyoxyethylene 40 Sorbitol Septaoleate [Sorbitan] Other (See Comments)   Prednisone Other (See Comments)    **INJECTIONS ONLY** caused tachycardia     Sulfamethoxazole-Trimethoprim Other (See Comments)    Swelling of nostrils    MEDICATIONS:                                                                                                                     Prior to Admission:  Medications Prior to Admission  Medication Sig Dispense Refill Last Dose   acetaminophen (TYLENOL) 500 MG tablet Take 500 mg by mouth at bedtime.   Past Week   ALPRAZolam (XANAX) 1 MG tablet TAKE 1/2 TABLET BY MOUTH AT BEDTIME AS NEEDED FOR ANXIETY OR SLEEP (Patient taking differently: Take 0.5 mg by mouth at bedtime.) 15 tablet 3 07/30/2021   ASPERCREME LIDOCAINE EX Apply 1 application topically at bedtime as needed (foot pain).   Past Week   aspirin EC 81 MG tablet Take 81 mg by mouth daily. Swallow whole.   07/30/2021   Calcium-Magnesium-Vitamin D (CALCIUM 1200+D3 PO) Take 1 tablet by mouth at bedtime.   07/30/2021   diclofenac Sodium (VOLTAREN) 1 % GEL APPLY 4 GRAMS TO AFFECTED AREA 4 TIMES DAILY (Patient taking differently: Apply 4 g topically 4 (four) times daily as needed (foot pain).) 200 g 11 Past Week   Melatonin 10 MG CAPS Take 10 mg by mouth at bedtime.   07/30/2021   methocarbamol (ROBAXIN) 750 MG tablet Take 750 mg by mouth daily as needed (pain).   Past Week   Multiple Vitamins-Minerals (EYE VITAMINS PO) Take 1 capsule by mouth daily.   07/30/2021   Omega-3 Fatty Acids (OMEGA-3 FISH OIL PO) Take 1,000 mg by mouth daily.   07/30/2021   pantoprazole (PROTONIX) 40 MG tablet Take 1 tablet (40 mg total) by mouth 2 (two) times daily. [place]  tablet 3 07/31/2021   triamcinolone cream (KENALOG) 0.1 % Apply 1 application topically 2 (two) times daily as needed. rash (Patient taking differently: Apply 1 application topically 2 (two) times daily as needed (arm rash).) 30 g 0 Past Week   ASPIRIN PO Take 81 mg by mouth daily. (Patient not taking: Reported on 08/01/2021)   Not Taking   Scheduled:  aspirin  81 mg Oral Daily   pantoprazole  40 mg Oral BID   Continuous:   ROS:                                                                                                                                        No CP, headache, nausea, vomiting, palpitations or diaphoresis. Other ROS as per HPI.    Blood pressure (!) 149/76, pulse 74, temperature 98.2 F (36.8 C), temperature source Oral, resp. rate 13, height 5\' 4"  (1.626 m), weight 76.3 kg, SpO2 95 %.   General Examination:                                                                                                       Physical Exam  HEENT-  [place]/AT    Lungs- Respirations unlabored. No tachypnea noted.  Extremities- No edema.   Neurological Examination Mental Status:  Alert, oriented, thought content appropriate.  Speech fluent without evidence of aphasia.  Able to follow all commands without difficulty. Cranial Nerves: II: Temporal visual fields intact with no extinction to DSS. Fixates and tracks normally. PERRL.  III,IV, VI: No ptosis. EOMI without double vision.  V: Temp sensation equal bilaterally  VII: Smile symmetric VIII: Hearing intact to voice IX,X: No hypophonia XI: Symmetric XII: Midline tongue extension Motor: Right : Upper extremity   5/5    Left:     Upper extremity   5/5  Lower extremity   5/5     Lower extremity   5/5 Tone and bulk are normal.  Sensory: Temp and light touch intact throughout, bilaterally. No extinction to DSS. No allodynia or dysesthesias.  Deep Tendon Reflexes: 2+ and symmetric throughout Plantars: Equivocal bilaterally  Cerebellar: No ataxia with FNF bilaterally  Gait: Deferred   Lab Results: Basic Metabolic Panel: Recent Labs  Lab 07/31/21 2130 08/01/21 0444  NA 143 140  K 3.8 4.1  CL 107 105  CO2 25 25  GLUCOSE 145* 117*  BUN 12 10  CREATININE 0.56 0.59  CALCIUM 9.2 9.0  MG  --  2.3  CBC: Recent Labs  Lab 07/31/21 2130 08/01/21 0444  WBC 6.4 5.5  NEUTROABS 3.3 2.6  HGB 13.1 12.9  HCT 40.9 40.1  MCV 87.6 87.4  PLT 270 265    Cardiac  Enzymes: Recent Labs  Lab 07/31/21 0010  CKTOTAL 69    Lipid Panel: Recent Labs  Lab 08/01/21 0444  CHOL 188  TRIG 73  HDL 69  CHOLHDL 2.7  VLDL 15  LDLCALC 104*    Imaging: DG Chest 2 View  Result Date: 07/31/2021 CLINICAL DATA:  Shortness of breath EXAM: CHEST - 2 VIEW COMPARISON:  None. FINDINGS: The heart size and mediastinal contours are within normal limits. Both lungs are clear. Hardware in the cervical spine. IMPRESSION: No active cardiopulmonary disease. Electronically Signed   By: Donavan Foil M.D.   On: 07/31/2021 22:26   CT Head Wo Contrast  Result Date: 07/31/2021 CLINICAL DATA:  Headache EXAM: CT HEAD WITHOUT CONTRAST TECHNIQUE: Contiguous axial images were obtained from the base of the skull through the vertex without intravenous contrast. COMPARISON:  None. FINDINGS: Brain: No evidence of acute infarction, hemorrhage, hydrocephalus, extra-axial collection or mass lesion/mass effect. Vascular: No hyperdense vessel or unexpected calcification. Skull: Normal. Negative for fracture or focal lesion. Sinuses/Orbits: The visualized paranasal sinuses are essentially clear. The mastoid air cells are unopacified. Other: None. IMPRESSION: Normal head CT. Electronically Signed   By: Julian Hy M.D.   On: 07/31/2021 22:25     Assessment: 70 year old female with a PMHx of generalized anxiety disorder, depression, fatty liver and hyperglycemia, presenting with new onset of left sided weakness and shortness of breath. Symptoms now resolved. 1. Exam is nonfocal 2. MRI brain with MRA of head and neck: No evidence of recent infarction, hemorrhage, or mass. No large vessel occlusion or hemodynamically significant stenosis. No evidence of dissection. 3. MRI cervical spine: C4-C5 through C6-C7 ACDF since last year with improved thecal sac and foraminal patency at those levels. Borderline to mild residual spinal stenosis at C6-C7. Adjacent segment disease at both C3-C4 and C7-T1 in  the form of facet and ligament flavum hypertrophy. Mild to moderate C4 neural foraminal stenosis appears stable. Mild if any left C8 foraminal stenosis. Stable facet degeneration also on the right at C2-C3 with mild foraminal stenosis. 4. Most likely components of the DDx consist of TIA versus anxiety-related symptoms  Recommendations: 1. Continue ASA 2. Echocardiogram 3. Cardiac telemetry 4. Outpatient Neurology follow up.     Electronically signed: Dr. Kerney Elbe 08/01/2021, 8:28 AM

## 2021-08-01 NOTE — Progress Notes (Signed)
*  PRELIMINARY RESULTS* Echocardiogram 2D Echocardiogram has been performed.  Elpidio Anis 08/01/2021, 4:08 PM

## 2021-08-01 NOTE — Evaluation (Signed)
Physical Therapy Evaluation Patient Details Name: [name] MRN: [id number] DOB: 11-15-1950 Today's Date: 08/01/2021  History of Present Illness  [name] is a 70 y.o. female presenting with concern for Guillain-Barr vs. stroke following receiving a shingles shot 2 days ago, presents with LUE pain, new L sciatica and SOB. PMHx: depression, fatty liver, GERD, hyperglycemia.  Clinical Impression  Patient presents with mobility at baseline and independent without assistive device.  Performed DGI scoring 24/24 without fall risk.  She hoped for d/c home today.  No follow up PT recommended.       Recommendations for follow up therapy are one component of a multi-disciplinary discharge planning process, led by the attending physician.  Recommendations may be updated based on patient status, additional functional criteria and insurance authorization.  Follow Up Recommendations No PT follow up    Assistance Recommended at Discharge PRN  Functional Status Assessment Patient has not had a recent decline in their functional status  Equipment Recommendations  None recommended by PT    Recommendations for Other Services       Precautions / Restrictions Precautions Precautions: None      Mobility  Bed Mobility Overal bed mobility: Independent                  Transfers Overall transfer level: Independent Equipment used: None                    Ambulation/Gait Ambulation/Gait assistance: Independent Gait Distance (Feet): 250 Feet Assistive device: None Gait Pattern/deviations: WFL(Within Functional Limits);Step-through pattern          Stairs Stairs: Yes Stairs assistance: Independent Stair Management: No rails;Alternating pattern Number of Stairs: 4    Wheelchair Mobility    Modified Rankin (Stroke Patients Only)       Balance Overall balance assessment: Independent                               Standardized Balance  Assessment Standardized Balance Assessment : Dynamic Gait Index   Dynamic Gait Index Level Surface: Normal Change in Gait Speed: Normal Gait with Horizontal Head Turns: Normal Gait with Vertical Head Turns: Normal Gait and Pivot Turn: Normal Step Over Obstacle: Normal Step Around Obstacles: Normal Steps: Normal Total Score: 24       Pertinent Vitals/Pain Pain Assessment: No/denies pain    Home Living Family/patient expects to be discharged to:: Private residence Living Arrangements: Spouse/significant other Available Help at Discharge: Family;Available 24 hours/day Type of Home: House Home Access: Level entry       Home Layout: One level (sunken den) Home Equipment: Tub bench      Prior Function Prior Level of Function : Independent/Modified Independent             Mobility Comments: no AD, pt walks for exercise 30 min/day ADLs Comments: indep, drives, retired Therapist, sports from [employer]   Dominant Hand: Right    Extremity/Trunk Assessment   Upper Extremity Assessment Upper Extremity Assessment: Defer to OT evaluation LUE Deficits / Details: overall WFL. ulnar distribution parasthesias LUE Sensation: decreased light touch LUE Coordination: WNL    Lower Extremity Assessment Lower Extremity Assessment: Overall WFL for tasks assessed (has burning on both feet, uses topical analgesic)    Cervical / Trunk Assessment Cervical / Trunk Assessment: Normal  Communication   Communication: No difficulties  Cognition Arousal/Alertness: Awake/alert Behavior During Therapy: WFL for  tasks assessed/performed Overall Cognitive Status: Within Functional Limits for tasks assessed                                          General Comments General comments (skin integrity, edema, etc.): VSS on RA, husband present and supportive    Exercises     Assessment/Plan    PT Assessment Patient does not need any further PT services  PT Problem List          PT Treatment Interventions      PT Goals (Current goals can be found in the Care Plan section)  Acute Rehab PT Goals Patient Stated Goal: go home today PT Goal Formulation: All assessment and education complete, DC therapy    Frequency     Barriers to discharge        Co-evaluation               AM-PAC PT "6 Clicks" Mobility  Outcome Measure Help needed turning from your back to your side while in a flat bed without using bedrails?: None Help needed moving from lying on your back to sitting on the side of a flat bed without using bedrails?: None Help needed moving to and from a bed to a chair (including a wheelchair)?: None Help needed standing up from a chair using your arms (e.g., wheelchair or bedside chair)?: None Help needed to walk in hospital room?: None Help needed climbing 3-5 steps with a railing? : None 6 Click Score: 24    End of Session   Activity Tolerance: Patient tolerated treatment well Patient left: in bed;with family/visitor present   PT Visit Diagnosis: Other symptoms and signs involving the nervous system (R29.898)    Time: 1610-1630 PT Time Calculation (min) (ACUTE ONLY): 20 min   Charges:   PT Evaluation $PT Eval Low Complexity: 1 Low          Magda Kiel, PT Acute Rehabilitation Services Pager:[phone] Office:[phone] 08/01/2021   [name] 08/01/2021, 4:43 PM

## 2021-08-01 NOTE — Plan of Care (Signed)

## 2021-08-02 DIAGNOSIS — R531 Weakness: Secondary | ICD-10-CM | POA: Diagnosis not present

## 2021-08-02 NOTE — Discharge Summary (Signed)
Physician Discharge Summary  [name] [facility] QQV:[id number] DOB: 08-29-1951 DOA: 07/31/2021  PCP: Abner Greenspan, MD  Admit date: 07/31/2021 Discharge date: 08/02/2021  Admitted From: Home Disposition: Home  Recommendations for Outpatient Follow-up:  Follow up with PCP in 1-2 weeks Please obtain BMP/CBC in one week Please follow up with neurology as scheduled  Home Health: None Equipment/Devices: None  Discharge Condition: Stable CODE STATUS: Full Diet recommendation: Low-salt low-fat diet  Brief/Interim Summary: [name] is a 70 y.o. female with medical history significant for GERD, generalized anxiety disorder presenting from home who is admitted to [facility] on 07/31/2021 for further stroke/TIA work-up after presenting from home complaining left-sided weakness of both upper and lower extremities, patient denies concurrent dysarthria, facial droop, acute change in vision, dysphagia, vertigo.   Assessment & Plan:   Left hemiparesis resolved, CVA ruled out Likely TIA -Neurology following, appreciate insight recommendations -Initial imaging appears to be negative for recent infarct hemorrhage mass vessel occlusion stenosis or dissection. -Echo unremarkable -PT OT to follow with no further recommendations, back to baseline -Discussed low-fat low-salt diet, monitoring salt intake hypertension as well as other risk factors for cardiovascular and stroke -Patient unfortunately considers the inciting event for the above her recent shingles vaccine which we discussed at length would be an unlikely culprit for any acute transient vascular event such as a TIA   GERD -Continue PPI   Anxiety, chronic -Continue home Xanax   Discharge Instructions  Discharge Instructions     Discharge patient   Complete by: As directed    Discharge disposition: 01-Home or Self Care   Discharge patient date: 08/02/2021      Allergies as of 08/02/2021       Reactions   Atorvastatin  Other (See Comments)   REACTION: rash   Ezetimibe Other (See Comments)   REACTION: muscle aches   Polyoxyethylene 40 Sorbitol Septaoleate [sorbitan] Other (See Comments)   Prednisone Other (See Comments)   **INJECTIONS ONLY** caused tachycardia    Sulfamethoxazole-trimethoprim Other (See Comments)   Swelling of nostrils        Medication List     STOP taking these medications    ASPIRIN PO       TAKE these medications    acetaminophen 500 MG tablet Commonly known as: TYLENOL Take 500 mg by mouth at bedtime.   ALPRAZolam 1 MG tablet Commonly known as: XANAX TAKE 1/2 TABLET BY MOUTH AT BEDTIME AS NEEDED FOR ANXIETY OR SLEEP What changed: See the new instructions.   ASPERCREME LIDOCAINE EX Apply 1 application topically at bedtime as needed (foot pain).   aspirin EC 81 MG tablet Take 81 mg by mouth daily. Swallow whole.   CALCIUM 1200+D3 PO Take 1 tablet by mouth at bedtime.   diclofenac Sodium 1 % Gel Commonly known as: VOLTAREN APPLY 4 GRAMS TO AFFECTED AREA 4 TIMES DAILY What changed: See the new instructions.   EYE VITAMINS PO Take 1 capsule by mouth daily.   Melatonin 10 MG Caps Take 10 mg by mouth at bedtime.   methocarbamol 750 MG tablet Commonly known as: ROBAXIN Take 750 mg by mouth daily as needed (pain).   OMEGA-3 FISH OIL PO Take 1,000 mg by mouth daily.   pantoprazole 40 MG tablet Commonly known as: PROTONIX Take 1 tablet (40 mg total) by mouth 2 (two) times daily.   triamcinolone cream 0.1 % Commonly known as: KENALOG Apply 1 application topically 2 (two) times daily as needed. rash What changed:  reasons to take this additional instructions        Allergies  Allergen Reactions   Atorvastatin Other (See Comments)    REACTION: rash   Ezetimibe Other (See Comments)    REACTION: muscle aches   Polyoxyethylene 40 Sorbitol Septaoleate [Sorbitan] Other (See Comments)   Prednisone Other (See Comments)    **INJECTIONS ONLY** caused  tachycardia    Sulfamethoxazole-Trimethoprim Other (See Comments)    Swelling of nostrils    Consultations: Neuro  Procedures/Studies: DG Chest 2 View  Result Date: 07/31/2021 CLINICAL DATA:  Shortness of breath EXAM: CHEST - 2 VIEW COMPARISON:  None. FINDINGS: The heart size and mediastinal contours are within normal limits. Both lungs are clear. Hardware in the cervical spine. IMPRESSION: No active cardiopulmonary disease. Electronically Signed   By: Donavan Foil M.D.   On: 07/31/2021 22:26   CT Head Wo Contrast  Result Date: 07/31/2021 CLINICAL DATA:  Headache EXAM: CT HEAD WITHOUT CONTRAST TECHNIQUE: Contiguous axial images were obtained from the base of the skull through the vertex without intravenous contrast. COMPARISON:  None. FINDINGS: Brain: No evidence of acute infarction, hemorrhage, hydrocephalus, extra-axial collection or mass lesion/mass effect. Vascular: No hyperdense vessel or unexpected calcification. Skull: Normal. Negative for fracture or focal lesion. Sinuses/Orbits: The visualized paranasal sinuses are essentially clear. The mastoid air cells are unopacified. Other: None. IMPRESSION: Normal head CT. Electronically Signed   By: Julian Hy M.D.   On: 07/31/2021 22:25   MR ANGIO HEAD WO CONTRAST  Result Date: 08/01/2021 CLINICAL DATA:  Neuro deficit, acute, stroke suspected; Stroke, follow up EXAM: MRI HEAD WITHOUT AND WITH CONTRAST MRA HEAD WITHOUT CONTRAST MRA NECK WITHOUT AND WITH CONTRAST TECHNIQUE: Multiplanar, multiecho pulse sequences of the brain and surrounding structures were obtained without and with intravenous contrast. Angiographic images of the Circle of Willis were obtained using MRA technique without intravenous contrast. Angiographic images of the neck were obtained using MRA technique without and with intravenous contrast. Carotid stenosis measurements (when applicable) are obtained utilizing NASCET criteria, using the distal internal carotid diameter  as the denominator. CONTRAST:  7.71mL GADAVIST GADOBUTROL 1 MMOL/ML IV SOLN COMPARISON:  None. FINDINGS: MRI HEAD Brain: There is no acute infarction or intracranial hemorrhage. There is no intracranial mass, mass effect, or edema. There is no hydrocephalus or extra-axial fluid collection. Ventricles and sulci are normal in size and configuration. Vascular: Major vessel flow voids at the skull base are preserved. Skull and upper cervical spine: Normal marrow signal is preserved. Partially imaged susceptibility artifact from anterior fusion at C4. Sinuses/Orbits: Paranasal sinuses are aerated. Orbits are unremarkable. Other: Sella is unremarkable.  Minimal mastoid fluid opacification. MRA HEAD Intracranial internal carotid arteries are patent. Middle and anterior cerebral arteries are patent. Intracranial vertebral arteries, basilar artery, posterior cerebral arteries are patent. There is no significant stenosis or aneurysm. MRA NECK Common, internal, and external carotid arteries are patent. Extracranial vertebral arteries are patent. There is no hemodynamically significant stenosis or evidence of dissection. IMPRESSION: No evidence of recent infarction, hemorrhage, or mass. No large vessel occlusion or hemodynamically significant stenosis. No evidence of dissection. Electronically Signed   By: Macy Mis M.D.   On: 08/01/2021 11:51   MR ANGIO NECK W WO CONTRAST  Result Date: 08/01/2021 CLINICAL DATA:  Neuro deficit, acute, stroke suspected; Stroke, follow up EXAM: MRI HEAD WITHOUT AND WITH CONTRAST MRA HEAD WITHOUT CONTRAST MRA NECK WITHOUT AND WITH CONTRAST TECHNIQUE: Multiplanar, multiecho pulse sequences of the brain and surrounding structures were obtained without and  with intravenous contrast. Angiographic images of the Circle of Willis were obtained using MRA technique without intravenous contrast. Angiographic images of the neck were obtained using MRA technique without and with intravenous contrast.  Carotid stenosis measurements (when applicable) are obtained utilizing NASCET criteria, using the distal internal carotid diameter as the denominator. CONTRAST:  7.23mL GADAVIST GADOBUTROL 1 MMOL/ML IV SOLN COMPARISON:  None. FINDINGS: MRI HEAD Brain: There is no acute infarction or intracranial hemorrhage. There is no intracranial mass, mass effect, or edema. There is no hydrocephalus or extra-axial fluid collection. Ventricles and sulci are normal in size and configuration. Vascular: Major vessel flow voids at the skull base are preserved. Skull and upper cervical spine: Normal marrow signal is preserved. Partially imaged susceptibility artifact from anterior fusion at C4. Sinuses/Orbits: Paranasal sinuses are aerated. Orbits are unremarkable. Other: Sella is unremarkable.  Minimal mastoid fluid opacification. MRA HEAD Intracranial internal carotid arteries are patent. Middle and anterior cerebral arteries are patent. Intracranial vertebral arteries, basilar artery, posterior cerebral arteries are patent. There is no significant stenosis or aneurysm. MRA NECK Common, internal, and external carotid arteries are patent. Extracranial vertebral arteries are patent. There is no hemodynamically significant stenosis or evidence of dissection. IMPRESSION: No evidence of recent infarction, hemorrhage, or mass. No large vessel occlusion or hemodynamically significant stenosis. No evidence of dissection. Electronically Signed   By: Macy Mis M.D.   On: 08/01/2021 11:51   MR Brain W and Wo Contrast  Result Date: 08/01/2021 CLINICAL DATA:  Neuro deficit, acute, stroke suspected; Stroke, follow up EXAM: MRI HEAD WITHOUT AND WITH CONTRAST MRA HEAD WITHOUT CONTRAST MRA NECK WITHOUT AND WITH CONTRAST TECHNIQUE: Multiplanar, multiecho pulse sequences of the brain and surrounding structures were obtained without and with intravenous contrast. Angiographic images of the Circle of Willis were obtained using MRA technique  without intravenous contrast. Angiographic images of the neck were obtained using MRA technique without and with intravenous contrast. Carotid stenosis measurements (when applicable) are obtained utilizing NASCET criteria, using the distal internal carotid diameter as the denominator. CONTRAST:  7.44mL GADAVIST GADOBUTROL 1 MMOL/ML IV SOLN COMPARISON:  None. FINDINGS: MRI HEAD Brain: There is no acute infarction or intracranial hemorrhage. There is no intracranial mass, mass effect, or edema. There is no hydrocephalus or extra-axial fluid collection. Ventricles and sulci are normal in size and configuration. Vascular: Major vessel flow voids at the skull base are preserved. Skull and upper cervical spine: Normal marrow signal is preserved. Partially imaged susceptibility artifact from anterior fusion at C4. Sinuses/Orbits: Paranasal sinuses are aerated. Orbits are unremarkable. Other: Sella is unremarkable.  Minimal mastoid fluid opacification. MRA HEAD Intracranial internal carotid arteries are patent. Middle and anterior cerebral arteries are patent. Intracranial vertebral arteries, basilar artery, posterior cerebral arteries are patent. There is no significant stenosis or aneurysm. MRA NECK Common, internal, and external carotid arteries are patent. Extracranial vertebral arteries are patent. There is no hemodynamically significant stenosis or evidence of dissection. IMPRESSION: No evidence of recent infarction, hemorrhage, or mass. No large vessel occlusion or hemodynamically significant stenosis. No evidence of dissection. Electronically Signed   By: Macy Mis M.D.   On: 08/01/2021 11:51   MR Cervical Spine Wo Contrast  Result Date: 08/01/2021 CLINICAL DATA:  70 year old female with acute neurologic deficit. Cervical spine ACDF. EXAM: MRI CERVICAL SPINE WITHOUT CONTRAST TECHNIQUE: Multiplanar, multisequence MR imaging of the cervical spine was performed. No intravenous contrast was administered.  COMPARISON:  Preoperative cervical spine MRI 11/15/2019. Brain MRI and MRA of the same day  reported separately. FINDINGS: Alignment: Increase straightening of cervical lordosis since last year. No significant spondylolisthesis. Vertebrae: Mild hardware susceptibility artifact related to C4 through C7 ACDF. No marrow edema or evidence of acute osseous abnormality. Normal background bone marrow signal. Cord: Normal.  Capacious spinal canal at most levels. Posterior Fossa, vertebral arteries, paraspinal tissues: Cervicomedullary junction is within normal limits. Brain is reported separately. Preserved major vascular flow voids in the neck. Negative visible neck soft tissues. Disc levels: C2-C3: Mild to moderate facet hypertrophy on the right appears increased from last year. But mild associated right C3 foraminal stenosis is stable. C3-C4: Mild to moderate facet hypertrophy greater on the right. No spinal stenosis. Mild left and moderate right C4 foraminal stenosis appears stable. C4-C5: Interval ACDF. Improved foraminal patency and no stenosis. Evidence of solid posterior element arthrodesis on the left. C5-C6: Interval ACDF. Mild residual ligament flavum hypertrophy, and right foraminal endplate spurring but improved thecal sac and foraminal patency. C6-C7: Interval ACDF. Residual broad-based posterior disc or endplate and mild ligament flavum hypertrophy. Improved thecal sac patency with borderline to mild residual spinal stenosis (series 25, image 6). Improved, largely resolved C7 foraminal stenosis. C7-T1: Moderate facet and ligament flavum hypertrophy appears progressed. No spinal stenosis. Mild if any left C8 foraminal stenosis appears stable. Visible upper thoracic levels appear stable, including mild facet hypertrophy and disc bulging. No upper thoracic spinal stenosis. IMPRESSION: 1. C4-C5 through C6-C7 ACDF since last year with improved thecal sac and foraminal patency at those levels. Borderline to mild  residual spinal stenosis at C6-C7. 2. 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 Mild to moderate C4 neural foraminal stenosis appears stable. Mild if any left C8 foraminal stenosis. 3. Stable facet degeneration also on the right at C2-C3 with mild foraminal stenosis. Electronically Signed   By: Genevie Ann M.D.   On: 08/01/2021 11:45   ECHOCARDIOGRAM COMPLETE  Result Date: 08/01/2021    ECHOCARDIOGRAM REPORT   Patient Name:   [name] [facility] Date of Exam: 08/01/2021 Medical Rec #:  [id number]      Height:       64.0 in Accession #:    [id number]     Weight:       168.2 lb Date of Birth:  09-27-1950      BSA:          1.818 m Patient Age:    6 years       BP:           149/79 mmHg Patient Gender: F              HR:           74 bpm. Exam Location:  Inpatient Procedure: 2D Echo, Cardiac Doppler, Color Doppler and Intracardiac            Opacification Agent Indications:    Stroke  History:        Patient has prior history of Echocardiogram examinations, most                 recent 06/04/2013. Stroke.  Sonographer:    Wenda Low Referring Phys: [id number] Rhetta Mura  Sonographer Comments: Image acquisition challenging due to breast implants. IMPRESSIONS  1. The LV is not seen very well except with Definity contrast. The LV function is normal . . Left ventricular ejection fraction, by estimation, is 55 to 60%. The left ventricle has normal function. The left ventricle has  no regional wall motion abnormalities. Left ventricular diastolic parameters are indeterminate.  2. Right ventricular systolic function is normal. The right ventricular size is normal. There is normal pulmonary artery systolic pressure.  3. The mitral valve is grossly normal. Mild mitral valve regurgitation.  4. The aortic valve was not well visualized. Aortic valve regurgitation is trivial. FINDINGS  Left Ventricle: The LV is not seen very well except with Definity contrast. The LV  function is normal. Left ventricular ejection fraction, by estimation, is 55 to 60%. The left ventricle has normal function. The left ventricle has no regional wall motion abnormalities. Definity contrast agent was given IV to delineate the left ventricular endocardial borders. The left ventricular internal cavity size was normal in size. There is borderline left ventricular hypertrophy. Left ventricular diastolic parameters are indeterminate. Right Ventricle: The right ventricular size is normal. Right vetricular wall thickness was not well visualized. Right ventricular systolic function is normal. There is normal pulmonary artery systolic pressure. The tricuspid regurgitant velocity is 2.58 m/s, and with an assumed right atrial pressure of 3 mmHg, the estimated right ventricular systolic pressure is 92.3 mmHg. Left Atrium: Left atrial size was normal in size. Right Atrium: Right atrial size was normal in size. Pericardium: There is no evidence of pericardial effusion. Mitral Valve: The mitral valve is grossly normal. Mild mitral valve regurgitation. MV peak gradient, 5.6 mmHg. The mean mitral valve gradient is 2.0 mmHg. Tricuspid Valve: The tricuspid valve is normal in structure. Tricuspid valve regurgitation is mild. Aortic Valve: The aortic valve was not well visualized. Aortic valve regurgitation is trivial. Aortic valve mean gradient measures 6.0 mmHg. Aortic valve peak gradient measures 10.4 mmHg. Aortic valve area, by VTI measures 2.31 cm. Pulmonic Valve: The pulmonic valve was normal in structure. Pulmonic valve regurgitation is not visualized. Aorta: The aortic root and ascending aorta are structurally normal, with no evidence of dilitation. IAS/Shunts: The interatrial septum was not well visualized.  LEFT VENTRICLE PLAX 2D LVIDd:         4.70 cm   Diastology LVIDs:         3.00 cm   LV e' medial:    5.55 cm/s LV PW:         1.10 cm   LV E/e' medial:  16.1 LV IVS:        1.20 cm   LV e' lateral:   6.31  cm/s LVOT diam:     2.00 cm   LV E/e' lateral: 14.1 LV SV:         80 LV SV Index:   44 LVOT Area:     3.14 cm  RIGHT VENTRICLE RV Basal diam:  3.10 cm RV Mid diam:    3.20 cm RV S prime:     12.80 cm/s TAPSE (M-mode): 2.3 cm LEFT ATRIUM             Index        RIGHT ATRIUM           Index LA diam:        3.40 cm 1.87 cm/m   RA Area:     14.30 cm LA Vol (A2C):   52.6 ml 28.94 ml/m  RA Volume:   32.90 ml  18.10 ml/m LA Vol (A4C):   45.8 ml 25.20 ml/m LA Biplane Vol: 49.4 ml 27.18 ml/m  AORTIC VALVE                     PULMONIC VALVE  AV Area (Vmax):    2.40 cm      PV Vmax:       0.94 m/s AV Area (Vmean):   2.25 cm      PV Peak grad:  3.5 mmHg AV Area (VTI):     2.31 cm AV Vmax:           161.00 cm/s AV Vmean:          117.000 cm/s AV VTI:            0.346 m AV Peak Grad:      10.4 mmHg AV Mean Grad:      6.0 mmHg LVOT Vmax:         123.00 cm/s LVOT Vmean:        83.800 cm/s LVOT VTI:          0.254 m LVOT/AV VTI ratio: 0.73  AORTA Ao Root diam: 2.40 cm Ao Asc diam:  2.50 cm MITRAL VALVE                TRICUSPID VALVE MV Area (PHT): 3.03 cm     TR Peak grad:   26.6 mmHg MV Area VTI:   2.23 cm     TR Vmax:        258.00 cm/s MV Peak grad:  5.6 mmHg MV Mean grad:  2.0 mmHg     SHUNTS MV Vmax:       1.18 m/s     Systemic VTI:  0.25 m MV Vmean:      71.1 cm/s    Systemic Diam: 2.00 cm MV Decel Time: 250 msec MV E velocity: 89.10 cm/s MV A velocity: 113.00 cm/s MV E/A ratio:  0.79 Mertie Moores MD Electronically signed by Mertie Moores MD Signature Date/Time: 08/01/2021/4:18:04 PM    Final      Subjective: No acute issues or events overnight   Discharge Exam: Vitals:   08/02/21 0401 08/02/21 0820  BP: (!) 141/73 129/78  Pulse: 63 73  Resp: 15 15  Temp: (!) 97.5 F (36.4 C) 97.6 F (36.4 C)  SpO2: 97% 97%   Vitals:   08/01/21 2044 08/02/21 0401 08/02/21 0500 08/02/21 0820  BP: (!) 152/87 (!) 141/73  129/78  Pulse: 78 63  73  Resp: (!) 21 15  15   Temp: 98.1 F (36.7 C) (!) 97.5 F (36.4 C)   97.6 F (36.4 C)  TempSrc: Oral Oral  Oral  SpO2: 97% 97%  97%  Weight:   77.1 kg   Height:        General: Pt is alert, awake, not in acute distress Cardiovascular: RRR, S1/S2 +, no rubs, no gallops Respiratory: CTA bilaterally, no wheezing, no rhonchi Abdominal: Soft, NT, ND, bowel sounds + Extremities: no edema, no cyanosis    The results of significant diagnostics from this hospitalization (including imaging, microbiology, ancillary and laboratory) are listed below for reference.     Microbiology: Recent Results (from the past 240 hour(s))  Resp Panel by RT-PCR (Flu A&B, Covid) Nasopharyngeal Swab     Status: None   Collection Time: 08/01/21 12:44 AM   Specimen: Nasopharyngeal Swab; Nasopharyngeal(NP) swabs in vial transport medium  Result Value Ref Range Status   SARS Coronavirus 2 by RT PCR NEGATIVE NEGATIVE Final    Comment: (NOTE) SARS-CoV-2 target nucleic acids are NOT DETECTED.  The SARS-CoV-2 RNA is generally detectable in upper respiratory specimens during the acute phase of infection. The lowest concentration of SARS-CoV-2 viral copies this assay  can detect is 138 copies/mL. A negative result does not preclude SARS-Cov-2 infection and should not be used as the sole basis for treatment or other patient management decisions. A negative result may occur with  improper specimen collection/handling, submission of specimen other than nasopharyngeal swab, presence of viral mutation(s) within the areas targeted by this assay, and inadequate number of viral copies(<138 copies/mL). A negative result must be combined with clinical observations, patient history, and epidemiological information. The expected result is Negative.  Fact Sheet for Patients:  EntrepreneurPulse.com.au  Fact Sheet for Healthcare Providers:  IncredibleEmployment.be  This test is no t yet approved or cleared by the [place] FDA and  has been authorized  for detection and/or diagnosis of SARS-CoV-2 by FDA under an Emergency Use Authorization (EUA). This EUA will remain  in effect (meaning this test can be used) for the duration of the COVID-19 declaration under Section 564(b)(1) of the Act, 21 U.S.C.section 360bbb-3(b)(1), unless the authorization is terminated  or revoked sooner.       Influenza A by PCR NEGATIVE NEGATIVE Final   Influenza B by PCR NEGATIVE NEGATIVE Final    Comment: (NOTE) The Xpert Xpress SARS-CoV-2/FLU/RSV plus assay is intended as an aid in the diagnosis of influenza from Nasopharyngeal swab specimens and should not be used as a sole basis for treatment. Nasal washings and aspirates are unacceptable for Xpert Xpress SARS-CoV-2/FLU/RSV testing.  Fact Sheet for Patients: EntrepreneurPulse.com.au  Fact Sheet for Healthcare Providers: IncredibleEmployment.be  This test is not yet approved or cleared by the [place] FDA and has been authorized for detection and/or diagnosis of SARS-CoV-2 by FDA under an Emergency Use Authorization (EUA). This EUA will remain in effect (meaning this test can be used) for the duration of the COVID-19 declaration under Section 564(b)(1) of the Act, 21 U.S.C. section 360bbb-3(b)(1), unless the authorization is terminated or revoked.  Performed at [facility], [address] [place], [place] [address]      Labs: BNP (last 3 results) Recent Labs    07/31/21 2130  BNP 39.0   Basic Metabolic Panel: Recent Labs  Lab 07/31/21 2130 08/01/21 0444  NA 143 140  K 3.8 4.1  CL 107 105  CO2 25 25  GLUCOSE 145* 117*  BUN 12 10  CREATININE 0.56 0.59  CALCIUM 9.2 9.0  MG  --  2.3   Liver Function Tests: Recent Labs  Lab 07/31/21 2130 08/01/21 0444  AST 18 23  ALT 30 32  ALKPHOS 55 57  BILITOT 0.5 0.8  PROT 7.3 7.1  ALBUMIN 4.6 3.9   No results for input(s): LIPASE, AMYLASE in the last 168 hours. No  results for input(s): AMMONIA in the last 168 hours. CBC: Recent Labs  Lab 07/31/21 2130 08/01/21 0444  WBC 6.4 5.5  NEUTROABS 3.3 2.6  HGB 13.1 12.9  HCT 40.9 40.1  MCV 87.6 87.4  PLT 270 265   Cardiac Enzymes: Recent Labs  Lab 07/31/21 0010  CKTOTAL 69   BNP: Invalid input(s): POCBNP CBG: Recent Labs  Lab 07/31/21 2133  GLUCAP 143*   D-Dimer Recent Labs    07/31/21 2130  DDIMER <0.27   Hgb A1c Recent Labs    08/01/21 0444  HGBA1C 6.4*   Lipid Profile Recent Labs    08/01/21 0444  CHOL 188  HDL 69  LDLCALC 104*  TRIG 73  CHOLHDL 2.7   Thyroid function studies Recent Labs    07/31/21 0010  TSH 2.930   Anemia work  up No results for input(s): VITAMINB12, FOLATE, FERRITIN, TIBC, IRON, RETICCTPCT in the last 72 hours. Urinalysis No results found for: COLORURINE, APPEARANCEUR, [place], [place], GLUCOSEU, [place], [place], [place], PROTEINUR, UROBILINOGEN, NITRITE, LEUKOCYTESUR Sepsis Labs Invalid input(s): PROCALCITONIN,  WBC,  LACTICIDVEN Microbiology Recent Results (from the past 240 hour(s))  Resp Panel by RT-PCR (Flu A&B, Covid) Nasopharyngeal Swab     Status: None   Collection Time: 08/01/21 12:44 AM   Specimen: Nasopharyngeal Swab; Nasopharyngeal(NP) swabs in vial transport medium  Result Value Ref Range Status   SARS Coronavirus 2 by RT PCR NEGATIVE NEGATIVE Final    Comment: (NOTE) SARS-CoV-2 target nucleic acids are NOT DETECTED.  The SARS-CoV-2 RNA is generally detectable in upper respiratory specimens during the acute phase of infection. The lowest concentration of SARS-CoV-2 viral copies this assay can detect is 138 copies/mL. A negative result does not preclude SARS-Cov-2 infection and should not be used as the sole basis for treatment or other patient management decisions. A negative result may occur with  improper specimen collection/handling, submission of specimen other than nasopharyngeal swab, presence of viral  mutation(s) within the areas targeted by this assay, and inadequate number of viral copies(<138 copies/mL). A negative result must be combined with clinical observations, patient history, and epidemiological information. The expected result is Negative.  Fact Sheet for Patients:  EntrepreneurPulse.com.au  Fact Sheet for Healthcare Providers:  IncredibleEmployment.be  This test is no t yet approved or cleared by the [place] FDA and  has been authorized for detection and/or diagnosis of SARS-CoV-2 by FDA under an Emergency Use Authorization (EUA). This EUA will remain  in effect (meaning this test can be used) for the duration of the COVID-19 declaration under Section 564(b)(1) of the Act, 21 U.S.C.section 360bbb-3(b)(1), unless the authorization is terminated  or revoked sooner.       Influenza A by PCR NEGATIVE NEGATIVE Final   Influenza B by PCR NEGATIVE NEGATIVE Final    Comment: (NOTE) The Xpert Xpress SARS-CoV-2/FLU/RSV plus assay is intended as an aid in the diagnosis of influenza from Nasopharyngeal swab specimens and should not be used as a sole basis for treatment. Nasal washings and aspirates are unacceptable for Xpert Xpress SARS-CoV-2/FLU/RSV testing.  Fact Sheet for Patients: EntrepreneurPulse.com.au  Fact Sheet for Healthcare Providers: IncredibleEmployment.be  This test is not yet approved or cleared by the [place] FDA and has been authorized for detection and/or diagnosis of SARS-CoV-2 by FDA under an Emergency Use Authorization (EUA). This EUA will remain in effect (meaning this test can be used) for the duration of the COVID-19 declaration under Section 564(b)(1) of the Act, 21 U.S.C. section 360bbb-3(b)(1), unless the authorization is terminated or revoked.  Performed at [facility], [address], [place], [place] [address]      Time  coordinating discharge: Over 30 minutes  SIGNED:   Little Ishikawa, DO [facility] 08/02/2021, 1:37 PM Pager   If 7PM-7AM, please contact night-coverage [email or web address]

## 2021-08-02 NOTE — Plan of Care (Signed)
  Problem: Education: Goal: Knowledge of General Education information will improve Description: Including pain rating scale, medication(s)/side effects and non-pharmacologic comfort measures Outcome: Adequate for Discharge   Problem: Health Behavior/Discharge Planning: Goal: Ability to manage health-related needs will improve Outcome: Adequate for Discharge   Problem: Clinical Measurements: Goal: Ability to maintain clinical measurements within normal limits will improve Outcome: Adequate for Discharge Goal: Will remain free from infection Outcome: Adequate for Discharge Goal: Diagnostic test results will improve Outcome: Adequate for Discharge Goal: Respiratory complications will improve Outcome: Adequate for Discharge Goal: Cardiovascular complication will be avoided Outcome: Adequate for Discharge   Problem: Activity: Goal: Risk for activity intolerance will decrease Outcome: Adequate for Discharge   Problem: Nutrition: Goal: Adequate nutrition will be maintained Outcome: Adequate for Discharge   Problem: Coping: Goal: Level of anxiety will decrease Outcome: Adequate for Discharge   Problem: Elimination: Goal: Will not experience complications related to bowel motility Outcome: Adequate for Discharge Goal: Will not experience complications related to urinary retention Outcome: Adequate for Discharge   Problem: Pain Managment: Goal: General experience of comfort will improve Outcome: Adequate for Discharge   Problem: Safety: Goal: Ability to remain free from injury will improve Outcome: Adequate for Discharge   Problem: Skin Integrity: Goal: Risk for impaired skin integrity will decrease Outcome: Adequate for Discharge   Problem: Education: Goal: Knowledge of disease or condition will improve Outcome: Adequate for Discharge Goal: Knowledge of secondary prevention will improve (SELECT ALL) Outcome: Adequate for Discharge Goal: Knowledge of patient specific  risk factors will improve (INDIVIDUALIZE FOR PATIENT) Outcome: Adequate for Discharge Goal: Individualized Educational Video(s) Outcome: Adequate for Discharge   Problem: Coping: Goal: Will verbalize positive feelings about self Outcome: Adequate for Discharge Goal: Will identify appropriate support needs Outcome: Adequate for Discharge   Problem: Health Behavior/Discharge Planning: Goal: Ability to manage health-related needs will improve Outcome: Adequate for Discharge   Problem: Self-Care: Goal: Ability to participate in self-care as condition permits will improve Outcome: Adequate for Discharge Goal: Verbalization of feelings and concerns over difficulty with self-care will improve Outcome: Adequate for Discharge Goal: Ability to communicate needs accurately will improve Outcome: Adequate for Discharge   Problem: Nutrition: Goal: Risk of aspiration will decrease Outcome: Adequate for Discharge Goal: Dietary intake will improve Outcome: Adequate for Discharge   Problem: Ischemic Stroke/TIA Tissue Perfusion: Goal: Complications of ischemic stroke/TIA will be minimized Outcome: Adequate for Discharge

## 2021-08-02 NOTE — Care Management Obs Status (Signed)
[facility] NOTIFICATION   Patient Details  Name: SCHERYL SANBORN MRN: [id number] Date of Birth: October 18, 1950   Medicare Observation Status Notification Given:  Yes    Carles Collet, RN 08/02/2021, 9:50 AM

## 2021-08-02 NOTE — Telephone Encounter (Signed)
Noted. Agreed.

## 2021-08-02 NOTE — Progress Notes (Signed)
Pt refused SCD's because she is hot-natured, plus has "bumps" on her heels & states the SCD's would irritate those by way of her Achilles tendons.

## 2021-08-02 NOTE — Progress Notes (Addendum)
STROKE TEAM PROGRESS NOTE   INTERVAL HISTORY Patient is seen in her room with her husband at the bedside.  She recently received a vaccine for shingles and presented to the hospital for evaluation of left-sided weakness and shortness of breath.  CT and MRI demonstrated no acute intracranial process, and she was admitted for TIA workup.    Vitals:   08/01/21 2044 08/02/21 0401 08/02/21 0500 08/02/21 0820  BP: (!) 152/87 (!) 141/73  129/78  Pulse: 78 63  73  Resp: (!) 21 15  15   Temp: 98.1 F (36.7 C) (!) 97.5 F (36.4 C)  97.6 F (36.4 C)  TempSrc: Oral Oral  Oral  SpO2: 97% 97%  97%  Weight:   77.1 kg   Height:       CBC:  Recent Labs  Lab 07/31/21 2130 08/01/21 0444  WBC 6.4 5.5  NEUTROABS 3.3 2.6  HGB 13.1 12.9  HCT 40.9 40.1  MCV 87.6 87.4  PLT 270 665   Basic Metabolic Panel:  Recent Labs  Lab 07/31/21 2130 08/01/21 0444  NA 143 140  K 3.8 4.1  CL 107 105  CO2 25 25  GLUCOSE 145* 117*  BUN 12 10  CREATININE 0.56 0.59  CALCIUM 9.2 9.0  MG  --  2.3   Lipid Panel:  Recent Labs  Lab 08/01/21 0444  CHOL 188  TRIG 73  HDL 69  CHOLHDL 2.7  VLDL 15  LDLCALC 104*   HgbA1c:  Recent Labs  Lab 08/01/21 0444  HGBA1C 6.4*   Urine Drug Screen: No results for input(s): LABOPIA, COCAINSCRNUR, LABBENZ, AMPHETMU, THCU, LABBARB in the last 168 hours.  Alcohol Level No results for input(s): ETH in the last 168 hours.  IMAGING past 24 hours MR ANGIO HEAD WO CONTRAST  Result Date: 08/01/2021 CLINICAL DATA:  Neuro deficit, acute, stroke suspected; Stroke, follow up EXAM: MRI HEAD WITHOUT AND WITH CONTRAST MRA HEAD WITHOUT CONTRAST MRA NECK WITHOUT AND WITH CONTRAST TECHNIQUE: Multiplanar, multiecho pulse sequences of the brain and surrounding structures were obtained without and with intravenous contrast. Angiographic images of the Circle of Willis were obtained using MRA technique without intravenous contrast. Angiographic images of the neck were obtained using MRA  technique without and with intravenous contrast. Carotid stenosis measurements (when applicable) are obtained utilizing NASCET criteria, using the distal internal carotid diameter as the denominator. CONTRAST:  7.27mL GADAVIST GADOBUTROL 1 MMOL/ML IV SOLN COMPARISON:  None. FINDINGS: MRI HEAD Brain: There is no acute infarction or intracranial hemorrhage. There is no intracranial mass, mass effect, or edema. There is no hydrocephalus or extra-axial fluid collection. Ventricles and sulci are normal in size and configuration. Vascular: Major vessel flow voids at the skull base are preserved. Skull and upper cervical spine: Normal marrow signal is preserved. Partially imaged susceptibility artifact from anterior fusion at C4. Sinuses/Orbits: Paranasal sinuses are aerated. Orbits are unremarkable. Other: Sella is unremarkable.  Minimal mastoid fluid opacification. MRA HEAD Intracranial internal carotid arteries are patent. Middle and anterior cerebral arteries are patent. Intracranial vertebral arteries, basilar artery, posterior cerebral arteries are patent. There is no significant stenosis or aneurysm. MRA NECK Common, internal, and external carotid arteries are patent. Extracranial vertebral arteries are patent. There is no hemodynamically significant stenosis or evidence of dissection. IMPRESSION: No evidence of recent infarction, hemorrhage, or mass. No large vessel occlusion or hemodynamically significant stenosis. No evidence of dissection. Electronically Signed   By: Macy Mis M.D.   On: 08/01/2021 11:51   MR ANGIO  NECK W WO CONTRAST  Result Date: 08/01/2021 CLINICAL DATA:  Neuro deficit, acute, stroke suspected; Stroke, follow up EXAM: MRI HEAD WITHOUT AND WITH CONTRAST MRA HEAD WITHOUT CONTRAST MRA NECK WITHOUT AND WITH CONTRAST TECHNIQUE: Multiplanar, multiecho pulse sequences of the brain and surrounding structures were obtained without and with intravenous contrast. Angiographic images of the Circle  of Willis were obtained using MRA technique without intravenous contrast. Angiographic images of the neck were obtained using MRA technique without and with intravenous contrast. Carotid stenosis measurements (when applicable) are obtained utilizing NASCET criteria, using the distal internal carotid diameter as the denominator. CONTRAST:  7.7mL GADAVIST GADOBUTROL 1 MMOL/ML IV SOLN COMPARISON:  None. FINDINGS: MRI HEAD Brain: There is no acute infarction or intracranial hemorrhage. There is no intracranial mass, mass effect, or edema. There is no hydrocephalus or extra-axial fluid collection. Ventricles and sulci are normal in size and configuration. Vascular: Major vessel flow voids at the skull base are preserved. Skull and upper cervical spine: Normal marrow signal is preserved. Partially imaged susceptibility artifact from anterior fusion at C4. Sinuses/Orbits: Paranasal sinuses are aerated. Orbits are unremarkable. Other: Sella is unremarkable.  Minimal mastoid fluid opacification. MRA HEAD Intracranial internal carotid arteries are patent. Middle and anterior cerebral arteries are patent. Intracranial vertebral arteries, basilar artery, posterior cerebral arteries are patent. There is no significant stenosis or aneurysm. MRA NECK Common, internal, and external carotid arteries are patent. Extracranial vertebral arteries are patent. There is no hemodynamically significant stenosis or evidence of dissection. IMPRESSION: No evidence of recent infarction, hemorrhage, or mass. No large vessel occlusion or hemodynamically significant stenosis. No evidence of dissection. Electronically Signed   By: Macy Mis M.D.   On: 08/01/2021 11:51   MR Brain W and Wo Contrast  Result Date: 08/01/2021 CLINICAL DATA:  Neuro deficit, acute, stroke suspected; Stroke, follow up EXAM: MRI HEAD WITHOUT AND WITH CONTRAST MRA HEAD WITHOUT CONTRAST MRA NECK WITHOUT AND WITH CONTRAST TECHNIQUE: Multiplanar, multiecho pulse  sequences of the brain and surrounding structures were obtained without and with intravenous contrast. Angiographic images of the Circle of Willis were obtained using MRA technique without intravenous contrast. Angiographic images of the neck were obtained using MRA technique without and with intravenous contrast. Carotid stenosis measurements (when applicable) are obtained utilizing NASCET criteria, using the distal internal carotid diameter as the denominator. CONTRAST:  7.71mL GADAVIST GADOBUTROL 1 MMOL/ML IV SOLN COMPARISON:  None. FINDINGS: MRI HEAD Brain: There is no acute infarction or intracranial hemorrhage. There is no intracranial mass, mass effect, or edema. There is no hydrocephalus or extra-axial fluid collection. Ventricles and sulci are normal in size and configuration. Vascular: Major vessel flow voids at the skull base are preserved. Skull and upper cervical spine: Normal marrow signal is preserved. Partially imaged susceptibility artifact from anterior fusion at C4. Sinuses/Orbits: Paranasal sinuses are aerated. Orbits are unremarkable. Other: Sella is unremarkable.  Minimal mastoid fluid opacification. MRA HEAD Intracranial internal carotid arteries are patent. Middle and anterior cerebral arteries are patent. Intracranial vertebral arteries, basilar artery, posterior cerebral arteries are patent. There is no significant stenosis or aneurysm. MRA NECK Common, internal, and external carotid arteries are patent. Extracranial vertebral arteries are patent. There is no hemodynamically significant stenosis or evidence of dissection. IMPRESSION: No evidence of recent infarction, hemorrhage, or mass. No large vessel occlusion or hemodynamically significant stenosis. No evidence of dissection. Electronically Signed   By: Macy Mis M.D.   On: 08/01/2021 11:51   MR Cervical Spine Wo Contrast  Result Date:  08/01/2021 CLINICAL DATA:  70 year old female with acute neurologic deficit. Cervical spine  ACDF. EXAM: MRI CERVICAL SPINE WITHOUT CONTRAST TECHNIQUE: Multiplanar, multisequence MR imaging of the cervical spine was performed. No intravenous contrast was administered. COMPARISON:  Preoperative cervical spine MRI 11/15/2019. Brain MRI and MRA of the same day reported separately. FINDINGS: Alignment: Increase straightening of cervical lordosis since last year. No significant spondylolisthesis. Vertebrae: Mild hardware susceptibility artifact related to C4 through C7 ACDF. No marrow edema or evidence of acute osseous abnormality. Normal background bone marrow signal. Cord: Normal.  Capacious spinal canal at most levels. Posterior Fossa, vertebral arteries, paraspinal tissues: Cervicomedullary junction is within normal limits. Brain is reported separately. Preserved major vascular flow voids in the neck. Negative visible neck soft tissues. Disc levels: C2-C3: Mild to moderate facet hypertrophy on the right appears increased from last year. But mild associated right C3 foraminal stenosis is stable. C3-C4: Mild to moderate facet hypertrophy greater on the right. No spinal stenosis. Mild left and moderate right C4 foraminal stenosis appears stable. C4-C5: Interval ACDF. Improved foraminal patency and no stenosis. Evidence of solid posterior element arthrodesis on the left. C5-C6: Interval ACDF. Mild residual ligament flavum hypertrophy, and right foraminal endplate spurring but improved thecal sac and foraminal patency. C6-C7: Interval ACDF. Residual broad-based posterior disc or endplate and mild ligament flavum hypertrophy. Improved thecal sac patency with borderline to mild residual spinal stenosis (series 25, image 6). Improved, largely resolved C7 foraminal stenosis. C7-T1: Moderate facet and ligament flavum hypertrophy appears progressed. No spinal stenosis. Mild if any left C8 foraminal stenosis appears stable. Visible upper thoracic levels appear stable, including mild facet hypertrophy and disc bulging.  No upper thoracic spinal stenosis. IMPRESSION: 1. C4-C5 through C6-C7 ACDF since last year with improved thecal sac and foraminal patency at those levels. Borderline to mild residual spinal stenosis at C6-C7. 2. 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 Mild to moderate C4 neural foraminal stenosis appears stable. Mild if any left C8 foraminal stenosis. 3. Stable facet degeneration also on the right at C2-C3 with mild foraminal stenosis. Electronically Signed   By: Genevie Ann M.D.   On: 08/01/2021 11:45   ECHOCARDIOGRAM COMPLETE  Result Date: 08/01/2021    ECHOCARDIOGRAM REPORT   Patient Name:   [name] [facility] Date of Exam: 08/01/2021 Medical Rec #:  [id number]      Height:       64.0 in Accession #:    [id number]     Weight:       168.2 lb Date of Birth:  06/13/1951      BSA:          1.818 m Patient Age:    72 years       BP:           149/79 mmHg Patient Gender: F              HR:           74 bpm. Exam Location:  Inpatient Procedure: 2D Echo, Cardiac Doppler, Color Doppler and Intracardiac            Opacification Agent Indications:    Stroke  History:        Patient has prior history of Echocardiogram examinations, most                 recent 06/04/2013. Stroke.  Sonographer:    Wenda Low Referring Phys: [id number] Rhetta Mura  Sonographer Comments: Image acquisition challenging due to breast implants. IMPRESSIONS  1. The LV is not seen very well except with Definity contrast. The LV function is normal . . Left ventricular ejection fraction, by estimation, is 55 to 60%. The left ventricle has normal function. The left ventricle has no regional wall motion abnormalities. Left ventricular diastolic parameters are indeterminate.  2. Right ventricular systolic function is normal. The right ventricular size is normal. There is normal pulmonary artery systolic pressure.  3. The mitral valve is grossly normal. Mild mitral valve regurgitation.  4. The  aortic valve was not well visualized. Aortic valve regurgitation is trivial. FINDINGS  Left Ventricle: The LV is not seen very well except with Definity contrast. The LV function is normal. Left ventricular ejection fraction, by estimation, is 55 to 60%. The left ventricle has normal function. The left ventricle has no regional wall motion abnormalities. Definity contrast agent was given IV to delineate the left ventricular endocardial borders. The left ventricular internal cavity size was normal in size. There is borderline left ventricular hypertrophy. Left ventricular diastolic parameters are indeterminate. Right Ventricle: The right ventricular size is normal. Right vetricular wall thickness was not well visualized. Right ventricular systolic function is normal. There is normal pulmonary artery systolic pressure. The tricuspid regurgitant velocity is 2.58 m/s, and with an assumed right atrial pressure of 3 mmHg, the estimated right ventricular systolic pressure is 88.4 mmHg. Left Atrium: Left atrial size was normal in size. Right Atrium: Right atrial size was normal in size. Pericardium: There is no evidence of pericardial effusion. Mitral Valve: The mitral valve is grossly normal. Mild mitral valve regurgitation. MV peak gradient, 5.6 mmHg. The mean mitral valve gradient is 2.0 mmHg. Tricuspid Valve: The tricuspid valve is normal in structure. Tricuspid valve regurgitation is mild. Aortic Valve: The aortic valve was not well visualized. Aortic valve regurgitation is trivial. Aortic valve mean gradient measures 6.0 mmHg. Aortic valve peak gradient measures 10.4 mmHg. Aortic valve area, by VTI measures 2.31 cm. Pulmonic Valve: The pulmonic valve was normal in structure. Pulmonic valve regurgitation is not visualized. Aorta: The aortic root and ascending aorta are structurally normal, with no evidence of dilitation. IAS/Shunts: The interatrial septum was not well visualized.  LEFT VENTRICLE PLAX 2D LVIDd:          4.70 cm   Diastology LVIDs:         3.00 cm   LV e' medial:    5.55 cm/s LV PW:         1.10 cm   LV E/e' medial:  16.1 LV IVS:        1.20 cm   LV e' lateral:   6.31 cm/s LVOT diam:     2.00 cm   LV E/e' lateral: 14.1 LV SV:         80 LV SV Index:   44 LVOT Area:     3.14 cm  RIGHT VENTRICLE RV Basal diam:  3.10 cm RV Mid diam:    3.20 cm RV S prime:     12.80 cm/s TAPSE (M-mode): 2.3 cm LEFT ATRIUM             Index        RIGHT ATRIUM           Index LA diam:        3.40 cm 1.87 cm/m   RA Area:     14.30 cm LA Vol (A2C):   52.6 ml 28.94 ml/m  RA Volume:   32.90 ml  18.10 ml/m LA Vol (A4C):   45.8 ml 25.20 ml/m LA Biplane Vol: 49.4 ml 27.18 ml/m  AORTIC VALVE                     PULMONIC VALVE AV Area (Vmax):    2.40 cm      PV Vmax:       0.94 m/s AV Area (Vmean):   2.25 cm      PV Peak grad:  3.5 mmHg AV Area (VTI):     2.31 cm AV Vmax:           161.00 cm/s AV Vmean:          117.000 cm/s AV VTI:            0.346 m AV Peak Grad:      10.4 mmHg AV Mean Grad:      6.0 mmHg LVOT Vmax:         123.00 cm/s LVOT Vmean:        83.800 cm/s LVOT VTI:          0.254 m LVOT/AV VTI ratio: 0.73  AORTA Ao Root diam: 2.40 cm Ao Asc diam:  2.50 cm MITRAL VALVE                TRICUSPID VALVE MV Area (PHT): 3.03 cm     TR Peak grad:   26.6 mmHg MV Area VTI:   2.23 cm     TR Vmax:        258.00 cm/s MV Peak grad:  5.6 mmHg MV Mean grad:  2.0 mmHg     SHUNTS MV Vmax:       1.18 m/s     Systemic VTI:  0.25 m MV Vmean:      71.1 cm/s    Systemic Diam: 2.00 cm MV Decel Time: 250 msec MV E velocity: 89.10 cm/s MV A velocity: 113.00 cm/s MV E/A ratio:  0.79 Mertie Moores MD Electronically signed by Mertie Moores MD Signature Date/Time: 08/01/2021/4:18:04 PM    Final     PHYSICAL EXAM General: Well-developed, alert patient in no acute distress   NEURO:  Mental Status: AA&Ox3  Speech/Language: speech is without dysarthria or aphasia.  Naming, repetition, fluency, and comprehension intact.  Cranial Nerves:  II:  PERRL. Visual fields full.  III, IV, VI: EOMI. Eyelids elevate symmetrically.  V: Sensation is intact to light touch and slightly subjectively weaker on the left- this is baseline per patient VII: Smile is symmetrical.  VIII: hearing intact to voice. IX, X: Phonation is normal.  XII: tongue is midline without fasciculations. Motor: 5/5 strength to all muscle groups tested.  Tone: is normal and bulk is normal Sensation- Intact to light touch bilaterally. Extinction absent to light touch to DSS.  Coordination: FTN intact bilaterally, No drift.  Gait- deferred  NIH stroke scale 0  ASSESSMENT/PLAN [name] is a 70 y.o. female with history of anxiety, depression, fatty liver and hyperglycemia presenting with left-sided weakness and shortness of breath. Patient received the shingles vaccine several days ago and began to experience flu-like side effects.  She the developed left-sided weakness and presented to the hospital for evaluation.  CT and MRI demonstrated no acute processes, and symptoms are most likely due to a TIA.  TIA:  right of right middle cerebral artery TIA secondary to unknown cause CT head No acute abnormality.  MRI  No acute infarction or  intracranial hemorrhage MRA  No hemodynamically significant stenosis or dissection 2D Echo EF 55-60%, interatrial septum not well visualized LDL 104 HgbA1c 6.4 VTE prophylaxis - declined SCDs, encourage ambulation    Diet   Diet regular Room service appropriate? Yes; Fluid consistency: Thin   aspirin 81 mg daily prior to admission, now on aspirin 81 mg daily.  Consider outpatient cardiac monitoring to rule out occult atrial fibrillation Therapy recommendations:  No PT or OT follow up needed Disposition:  home  Hypertension Home meds:  none Stable Keep SBP <180 Long-term BP goal normotensive  Hyperlipidemia Home meds:  none LDL 104, goal < 70 Patient has been unable to tolerate statins or Zetia previously, using OTC  fish oil at home Recommend outpatient follow up in lipid clinic to discuss other options- patient is amenable to this  Risk for Diabetes type II  Home meds:  none HgbA1c 6.4, goal < 7.0 CBGs Recent Labs    07/31/21 2133  GLUCAP 143*    Other Stroke Risk Factors Advanced Age >/= 58    Other Active Problems Anxiety Lorazepam as needed to help patient tolerate MRI  Hospital day # [place] , MSN, AGACNP-BC [facility] See Amion for schedule and pager information 08/02/2021 11:31 AM    ATTENDING ATTESTATION: This is a transfer from my freestanding ED.  There is concern for possible CVA however MRI is negative.  She has a TIA versus possible anxiety attack.  She does have a history of severe anxiety.  Work-up is unremarkable she will be discharged home today.   Dr. Reeves Forth evaluated pt independently, reviewed imaging, chart, labs. Discussed and formulated plan with the APP. Please see APP note above for details.   Total 30 minutes spent on counseling patient and coordinating care, writing notes and reviewing chart.  Christi Wirick,MD   To contact Stroke Continuity provider, please refer to [email or web address] After hours, contact General Neurology

## 2021-08-06 ENCOUNTER — Other Ambulatory Visit: Payer: Self-pay

## 2021-08-06 ENCOUNTER — Ambulatory Visit (INDEPENDENT_AMBULATORY_CARE_PROVIDER_SITE_OTHER): Payer: Medicare Other | Admitting: Family Medicine

## 2021-08-06 ENCOUNTER — Encounter: Payer: Self-pay | Admitting: Family Medicine

## 2021-08-06 VITALS — BP 132/78 | HR 80 | Temp 98.3°F | Ht 64.0 in | Wt 168.2 lb

## 2021-08-06 DIAGNOSIS — E785 Hyperlipidemia, unspecified: Secondary | ICD-10-CM | POA: Insufficient documentation

## 2021-08-06 DIAGNOSIS — R531 Weakness: Secondary | ICD-10-CM

## 2021-08-06 DIAGNOSIS — T50Z95S Adverse effect of other vaccines and biological substances, sequela: Secondary | ICD-10-CM | POA: Diagnosis not present

## 2021-08-06 DIAGNOSIS — T50Z95A Adverse effect of other vaccines and biological substances, initial encounter: Secondary | ICD-10-CM | POA: Insufficient documentation

## 2021-08-06 DIAGNOSIS — E7800 Pure hypercholesterolemia, unspecified: Secondary | ICD-10-CM

## 2021-08-06 NOTE — Assessment & Plan Note (Signed)
Pt is intol of meds so far incl statin and zetia  Disc goals for lipids and reasons to control them Rev last labs with pt Rev low sat fat diet in detail Plans to discuss with cardiology as goal for LDL is 70 or lower

## 2021-08-06 NOTE — Assessment & Plan Note (Signed)
With first shingrix vaccine- malaise/body pain and then L sided weakness (now resolved)  Do not suspect GB as this was bilateral  Will not plan to get 2nd vaccine

## 2021-08-06 NOTE — Assessment & Plan Note (Signed)
S/p episode of acute UE/LE weakness in the setting of malaise after getting first shingrix vaccine  Reviewed hospital records, lab results and studies in detail  TIA is in the differential after large work up (imaging/echo/labs) that was reassuring  Symptoms resolved within 24 hours and have not returned Nl exam today discussed risk factors for CVA and goals for bp and cholesterol and glucose Pt continues asa 81 mg (was taking at time of event)  Ref done to cardiology to consider monitor to r/o a fib  Antic guidance given

## 2021-08-06 NOTE — Progress Notes (Signed)
Subjective:    Patient ID: [name], female    DOB: 01-11-1951, 70 y.o.   MRN: [id number]  This visit occurred during the SARS-CoV-2 public health emergency.  Safety protocols were in place, including screening questions prior to the visit, additional usage of staff PPE, and extensive cleaning of exam room while observing appropriate contact time as indicated for disinfecting solutions.   HPI Pt presents for f/u of hospitalization   Wt Readings from Last 3 Encounters:  08/06/21 168 lb 4 oz (76.3 kg)  08/02/21 169 lb 15.6 oz (77.1 kg)  07/20/21 168 lb 8 oz (76.4 kg)   28.88 kg/m   She was hospitalized from 12/2 to 08/02/21 for L hemiparesis   She presented c/o L sided weakness (upper and lower) but w/o dysarthria, facial droop or dysphagia   Per pt -shot on Wednesday  Felt poorly 4 hours later- weak/sick feeling  Thursday -did not feel like walking Friday- sciatica pain L hip/buttock - went to [facility] and they sent her to the ER  Sob, inc bp (anxiety) and L sided weakness    A/P from discharge summary:  Assessment & Plan:   Left hemiparesis resolved, CVA ruled out Likely TIA -Neurology following, appreciate insight recommendations -Initial imaging appears to be negative for recent infarct hemorrhage mass vessel occlusion stenosis or dissection. -Echo unremarkable -PT OT to follow with no further recommendations, back to baseline -Discussed low-fat low-salt diet, monitoring salt intake hypertension as well as other risk factors for cardiovascular and stroke -Patient unfortunately considers the inciting event for the above her recent shingles vaccine which we discussed at length would be an unlikely culprit for any acute transient vascular event such as a TIA   GERD -Continue PPI   Anxiety, chronic -Continue home Xanax   CT report CT Head Wo Contrast (Accession [id number]) (Order [id number]) Imaging Date: 07/31/2021 Department: [facility] Released  By/Authorizing: Regan Lemming, MD (auto-released)   Exam Status  Status  Final [99]   PACS Intelerad Image Link   Show images for CT Head Wo Contrast  Study Result  Narrative & Impression  CLINICAL DATA:  Headache   EXAM: CT HEAD WITHOUT CONTRAST   TECHNIQUE: Contiguous axial images were obtained from the base of the skull through the vertex without intravenous contrast.   COMPARISON:  None.   FINDINGS: Brain: No evidence of acute infarction, hemorrhage, hydrocephalus, extra-axial collection or mass lesion/mass effect.   Vascular: No hyperdense vessel or unexpected calcification.   Skull: Normal. Negative for fracture or focal lesion.   Sinuses/Orbits: The visualized paranasal sinuses are essentially clear. The mastoid air cells are unopacified.   Other: None.   IMPRESSION: Normal head CT.    MRA head and neck MRA HEAD   Intracranial internal carotid arteries are patent. Middle and anterior cerebral arteries are patent. Intracranial vertebral arteries, basilar artery, posterior cerebral arteries are patent. There is no significant stenosis or aneurysm.   MRA NECK   Common, internal, and external carotid arteries are patent. Extracranial vertebral arteries are patent. There is no hemodynamically significant stenosis or evidence of dissection.   IMPRESSION: No evidence of recent infarction, hemorrhage, or mass.   No large vessel occlusion or hemodynamically significant stenosis. No evidence of dissection.  MRI HEAD   Brain: There is no acute infarction or intracranial hemorrhage. There is no intracranial mass, mass effect, or edema. There is no hydrocephalus or extra-axial fluid collection. Ventricles and sulci are normal in size and configuration.  Vascular: Major vessel flow voids at the skull base are preserved.   Skull and upper cervical spine: Normal marrow signal is preserved. Partially imaged susceptibility artifact from anterior fusion  at C4.   Sinuses/Orbits: Paranasal sinuses are aerated. Orbits are unremarkable.   Other: Sella is unremarkable.  Minimal mastoid fluid opacification   BP Readings from Last 3 Encounters:  08/06/21 132/78  08/02/21 134/76  07/31/21 (!) 168/91   Pulse Readings from Last 3 Encounters:  08/06/21 80  08/02/21 74  07/31/21 66   Lab Results  Component Value Date   CREATININE 0.59 08/01/2021   BUN 10 08/01/2021   NA 140 08/01/2021   K 4.1 08/01/2021   CL 105 08/01/2021   CO2 25 08/01/2021   Glucose 104 random Lab Results  Component Value Date   WBC 5.5 08/01/2021   HGB 12.9 08/01/2021   HCT 40.1 08/01/2021   MCV 87.4 08/01/2021   PLT 265 08/01/2021   Lab Results  Component Value Date   CHOL 188 08/01/2021   HDL 69 08/01/2021   LDLCALC 104 (H) 08/01/2021   LDLDIRECT 97.0 06/06/2018   TRIG 73 08/01/2021   CHOLHDL 2.7 08/01/2021  Intol of statin and zetia in the past  Lab Results  Component Value Date   VITAMINB12 380 07/20/2021   Lab Results  Component Value Date   DDIMER <0.27 07/31/2021   Lab Results  Component Value Date   HGBA1C 6.4 (H) 08/01/2021   Lab Results  Component Value Date   TSH 2.930 07/31/2021   Flu test/covid negative  Echo : IMPRESSIONS     1. The LV is not seen very well except with Definity contrast. The LV  function is normal . . Left ventricular ejection fraction, by estimation,  is 55 to 60%. The left ventricle has normal function. The left ventricle  has no regional wall motion  abnormalities. Left ventricular diastolic parameters are indeterminate.   2. Right ventricular systolic function is normal. The right ventricular  size is normal. There is normal pulmonary artery systolic pressure.   3. The mitral valve is grossly normal. Mild mitral valve regurgitation.   4. The aortic valve was not well visualized. Aortic valve regurgitation  is trivial.  Neurology recommended asa 81 mg  Consider outpt cardiac monitoring to r/o  occult a fib  The redness of arm from the vaccine is better   Has foot surgery planned in January  12  Will have general anesthesia   Back to walking  Eating healthy   Patient Active Problem List   Diagnosis Date Noted   Hyperlipidemia 08/06/2021   Vaccine reaction 08/06/2021   Acute left-sided weakness 08/01/2021   GAD (generalized anxiety disorder) 08/01/2021   Current use of proton pump inhibitor 07/20/2021   Rash 07/20/2021   Colon cancer screening 07/20/2021   Hot flashes 07/11/2020   Subclinical hypothyroidism 07/11/2020   Allergy to statin medication 07/11/2020   Medicare annual wellness visit, subsequent 07/10/2019   Paresthesia 04/06/2019   Foot pain, bilateral 04/06/2019   Vaginal atrophy 06/09/2018   Breast cancer screening 05/31/2017   Prediabetes 06/02/2016   Estrogen deficiency 01/29/2015   Routine general medical examination at a health care facility 02/20/2012   GERD 06/20/2008   Past Medical History:  Diagnosis Date   Depression    Fatty liver    Fibrocystic disease of both breasts    GERD (gastroesophageal reflux disease)    Hyperglycemia    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  On continue HRT since 1980 per patient   Sphincter of Oddi dysfunction    Past Surgical History:  Procedure Laterality Date   ABDOMINAL HYSTERECTOMY     CARPAL TUNNEL RELEASE  08/2008   left   ERCP     LEFT HEART CATHETERIZATION WITH CORONARY ANGIOGRAM N/A 06/08/2013   Procedure: LEFT HEART CATHETERIZATION WITH CORONARY ANGIOGRAM;  Surgeon: Ramond Dial, MD;  Location: [facility] CATH LAB;  Service: Cardiovascular;  Laterality: N/A;   MASTECTOMY     bilateral for severe macrocystic breast   SPINE SURGERY N/A 02/12/2020   neck   Social History   Tobacco Use   Smoking status: Never   Smokeless tobacco: Never  Vaping Use   Vaping Use: Never used  Substance Use Topics   Alcohol use: Yes    Comment: once every 2 weeks   Drug use: No   Family History   Problem Relation Age of Onset   Brain cancer Mother        hospice   Diabetes Father    Hypertension Father    Cancer Father        prostate and leukemia   Breast cancer Sister 58   Cancer Sister    Cancer Maternal Aunt        Pancreatic   Diabetes Maternal Uncle    Heart disease Maternal Grandmother    Allergies  Allergen Reactions   Atorvastatin Other (See Comments)    REACTION: rash   Ezetimibe Other (See Comments)    REACTION: muscle aches   Polyoxyethylene 40 Sorbitol Septaoleate [Sorbitan] Other (See Comments)   Prednisone Other (See Comments)    **INJECTIONS ONLY** caused tachycardia    Shingrix [Zoster Vac Recomb Adjuvanted]     Reaction ?  Weakness on one side    Sulfamethoxazole-Trimethoprim Other (See Comments)    Swelling of nostrils   Current Outpatient Medications on File Prior to Visit  Medication Sig Dispense Refill   acetaminophen (TYLENOL) 500 MG tablet Take 500 mg by mouth at bedtime.     ALPRAZolam (XANAX) 1 MG tablet TAKE 1/2 TABLET BY MOUTH AT BEDTIME AS NEEDED FOR ANXIETY OR SLEEP (Patient taking differently: Take 0.5 mg by mouth at bedtime.) 15 tablet 3   ASPERCREME LIDOCAINE EX Apply 1 application topically at bedtime as needed (foot pain).     aspirin EC 81 MG tablet Take 81 mg by mouth daily. Swallow whole.     Calcium-Magnesium-Vitamin D (CALCIUM 1200+D3 PO) Take 1 tablet by mouth at bedtime.     diclofenac Sodium (VOLTAREN) 1 % GEL APPLY 4 GRAMS TO AFFECTED AREA 4 TIMES DAILY (Patient taking differently: Apply 4 g topically 4 (four) times daily as needed (foot pain).) 200 g 11   Melatonin 10 MG CAPS Take 10 mg by mouth at bedtime.     methocarbamol (ROBAXIN) 750 MG tablet Take 750 mg by mouth daily as needed (pain).     Multiple Vitamins-Minerals (EYE VITAMINS PO) Take 1 capsule by mouth daily.     Omega-3 Fatty Acids (OMEGA-3 FISH OIL PO) Take 1,000 mg by mouth daily.     pantoprazole (PROTONIX) 40 MG tablet Take 1 tablet (40 mg total) by mouth  2 (two) times daily. 180 tablet 3   triamcinolone cream (KENALOG) 0.1 % Apply 1 application topically 2 (two) times daily as needed. rash (Patient taking differently: Apply 1 application topically 2 (two) times daily as needed (arm rash).) 30 g 0   No current facility-administered medications on  file prior to visit.    Review of Systems  Constitutional:  Positive for fatigue. Negative for activity change, appetite change, fever and unexpected weight change.  HENT:  Negative for congestion, ear pain, rhinorrhea, sinus pressure and sore throat.   Eyes:  Negative for pain, redness and visual disturbance.  Respiratory:  Negative for cough, shortness of breath and wheezing.   Cardiovascular:  Negative for chest pain and palpitations.  Gastrointestinal:  Negative for abdominal pain, blood in stool, constipation and diarrhea.  Endocrine: Negative for polydipsia and polyuria.  Genitourinary:  Negative for dysuria, frequency and urgency.  Musculoskeletal:  Negative for arthralgias, back pain and myalgias.  Skin:  Negative for pallor and rash.  Allergic/Immunologic: Negative for environmental allergies.  Neurological:  Negative for dizziness, tremors, seizures, syncope, facial asymmetry, speech difficulty, weakness, light-headedness, numbness and headaches.  Hematological:  Negative for adenopathy. Does not bruise/bleed easily.  Psychiatric/Behavioral:  Negative for decreased concentration and dysphoric mood. The patient is not nervous/anxious.       Objective:   Physical Exam Constitutional:      General: She is not in acute distress.    Appearance: Normal appearance. She is well-developed and normal weight. She is not ill-appearing or diaphoretic.  HENT:     Head: Normocephalic and atraumatic.     Right Ear: External ear normal.     Left Ear: External ear normal.     Nose: Nose normal.     Mouth/Throat:     Pharynx: No oropharyngeal exudate.  Eyes:     General: No scleral icterus.        Right eye: No discharge.        Left eye: No discharge.     Conjunctiva/sclera: Conjunctivae normal.     Pupils: Pupils are equal, round, and reactive to light.     Comments: No nystagmus  Neck:     Thyroid: No thyromegaly.     Vascular: No carotid bruit or JVD.     Trachea: No tracheal deviation.  Cardiovascular:     Rate and Rhythm: Normal rate and regular rhythm.     Heart sounds: Murmur heard.  Pulmonary:     Effort: Pulmonary effort is normal. No respiratory distress.     Breath sounds: Normal breath sounds. No wheezing or rales.  Abdominal:     General: Bowel sounds are normal. There is no distension.     Palpations: Abdomen is soft. There is no mass.     Tenderness: There is no abdominal tenderness.  Musculoskeletal:        General: No tenderness.     Cervical back: Full passive range of motion without pain, normal range of motion and neck supple.  Lymphadenopathy:     Cervical: No cervical adenopathy.  Skin:    General: Skin is warm and dry.     Coloration: Skin is not pale.     Findings: No rash.  Neurological:     Mental Status: She is alert and oriented to person, place, and time.     Cranial Nerves: No cranial nerve deficit, dysarthria or facial asymmetry.     Sensory: No sensory deficit.     Motor: Motor function is intact. No weakness, tremor, atrophy, abnormal muscle tone or pronator drift.     Coordination: Coordination is intact. Romberg sign negative. Coordination normal. Finger-Nose-Finger Test normal.     Gait: Gait is intact. Gait normal.     Deep Tendon Reflexes: Reflexes are normal and symmetric. Reflexes normal.  Comments: No focal cerebellar signs   Strength is full and symmetric in all extremities   Psychiatric:        Mood and Affect: Mood normal.        Behavior: Behavior normal.        Thought Content: Thought content normal.          Assessment & Plan:   Problem List Items Addressed This Visit       Other   Acute left-sided  weakness - Primary    S/p episode of acute UE/LE weakness in the setting of malaise after getting first shingrix vaccine  Reviewed hospital records, lab results and studies in detail  TIA is in the differential after large work up (imaging/echo/labs) that was reassuring  Symptoms resolved within 24 hours and have not returned Nl exam today discussed risk factors for CVA and goals for bp and cholesterol and glucose Pt continues asa 81 mg (was taking at time of event)  Ref done to cardiology to consider monitor to r/o a fib  Antic guidance given        Relevant Orders   Ambulatory referral to Cardiology   Hyperlipidemia    Pt is intol of meds so far incl statin and zetia  Disc goals for lipids and reasons to control them Rev last labs with pt Rev low sat fat diet in detail Plans to discuss with cardiology as goal for LDL is 70 or lower       Relevant Orders   Ambulatory referral to Cardiology   Vaccine reaction    With first shingrix vaccine- malaise/body pain and then L sided weakness (now resolved)  Do not suspect GB as this was bilateral  Will not plan to get 2nd vaccine

## 2021-08-06 NOTE — Patient Instructions (Signed)
I placed a referral to cardiology to rule out a fib  If symptoms return or change let us know   I do not want you to get a second shingles vaccine   Keep walking, eat healthy

## 2021-08-07 ENCOUNTER — Encounter: Payer: Self-pay | Admitting: *Deleted

## 2021-08-10 ENCOUNTER — Ambulatory Visit: Payer: Medicare Other | Admitting: Cardiology

## 2021-08-10 ENCOUNTER — Other Ambulatory Visit: Payer: Self-pay

## 2021-08-10 ENCOUNTER — Encounter: Payer: Self-pay | Admitting: Cardiology

## 2021-08-10 ENCOUNTER — Ambulatory Visit (INDEPENDENT_AMBULATORY_CARE_PROVIDER_SITE_OTHER): Payer: Medicare Other

## 2021-08-10 VITALS — BP 134/88 | HR 69 | Ht 64.0 in | Wt 170.6 lb

## 2021-08-10 DIAGNOSIS — E7800 Pure hypercholesterolemia, unspecified: Secondary | ICD-10-CM | POA: Diagnosis not present

## 2021-08-10 DIAGNOSIS — Z79899 Other long term (current) drug therapy: Secondary | ICD-10-CM | POA: Diagnosis not present

## 2021-08-10 DIAGNOSIS — R0789 Other chest pain: Secondary | ICD-10-CM | POA: Diagnosis not present

## 2021-08-10 DIAGNOSIS — R002 Palpitations: Secondary | ICD-10-CM

## 2021-08-10 LAB — MAGNESIUM: Magnesium: 2.4 mg/dL — ABNORMAL HIGH (ref 1.6–2.3)

## 2021-08-10 LAB — BASIC METABOLIC PANEL
BUN/Creatinine Ratio: 23 (ref 12–28)
BUN: 14 mg/dL (ref 8–27)
CO2: 26 mmol/L (ref 20–29)
Calcium: 9.7 mg/dL (ref 8.7–10.3)
Chloride: 103 mmol/L (ref 96–106)
Creatinine, Ser: 0.6 mg/dL (ref 0.57–1.00)
Glucose: 95 mg/dL (ref 70–99)
Potassium: 4.1 mmol/L (ref 3.5–5.2)
Sodium: 141 mmol/L (ref 134–144)
eGFR: 96 mL/min/{1.73_m2} (ref >59)

## 2021-08-10 MED ORDER — METOPROLOL TARTRATE 100 MG PO TABS: ORAL_TABLET | ORAL | 0 refills | Status: DC

## 2021-08-10 NOTE — Progress Notes (Signed)
Cardiology Office Note:    Date:  08/10/2021   ID:  [name], DOB [birth year], MRN [id number]  PCP:  Abner Greenspan, MD  Cardiologist:  Berniece Salines, DO  Electrophysiologist:  None   Referring MD: Abner Greenspan, MD   " I am experiencing some chest pain"  History of Present Illness:    [name] is a 70 y.o. female with a hx of left hemiparesis suspected to be TIA with negative imaging, GERD, chronic anxiety.  She was hospitalized from August 01, 2019 24th 2022 due to left hemiparesis which resolved with negative imaging.  The patient tells me that she has been experiencing intermittent chest discomfort.  She describes this as a midsternal pain, off and on.  It does not radiate.  She quantifies it as a 5 out of 10.  At first she thought that maybe the previous bilateral mastectomies implants which was done years ago may have started.  Now this is occurring frequently.  Of note in 2014 the patient underwent a Lexiscan which appears to be normal, she did say that she had a heart catheterization but unfortunately I do not see that information in our system.  She tells me that catheterization she was told it was normal.  Past Medical History:  Diagnosis Date   Depression    Fatty liver    Fibrocystic disease of both breasts    GERD (gastroesophageal reflux disease)    Hyperglycemia    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   On continue HRT since 1980 per patient   Sphincter of Oddi dysfunction     Past Surgical History:  Procedure Laterality Date   ABDOMINAL HYSTERECTOMY     CARPAL TUNNEL RELEASE  08/2008   left   ERCP     LEFT HEART CATHETERIZATION WITH CORONARY ANGIOGRAM N/A 06/08/2013   Procedure: LEFT HEART CATHETERIZATION WITH CORONARY ANGIOGRAM;  Surgeon: Ramond Dial, MD;  Location: [facility] CATH LAB;  Service: Cardiovascular;  Laterality: N/A;   MASTECTOMY     bilateral for severe macrocystic breast   SPINE SURGERY N/A 02/12/2020   neck     Current Medications: Current Meds  Medication Sig   acetaminophen (TYLENOL) 500 MG tablet Take 500 mg by mouth at bedtime.   ALPRAZolam (XANAX) 1 MG tablet TAKE 1/2 TABLET BY MOUTH AT BEDTIME AS NEEDED FOR ANXIETY OR SLEEP (Patient taking differently: Take 0.5 mg by mouth at bedtime.)   ASPERCREME LIDOCAINE EX Apply 1 application topically at bedtime as needed (foot pain).   aspirin EC 81 MG tablet Take 81 mg by mouth daily. Swallow whole.   Calcium-Magnesium-Vitamin D (CALCIUM 1200+D3 PO) Take 1 tablet by mouth at bedtime.   diclofenac Sodium (VOLTAREN) 1 % GEL APPLY 4 GRAMS TO AFFECTED AREA 4 TIMES DAILY (Patient taking differently: Apply 4 g topically 4 (four) times daily as needed (foot pain).)   Melatonin 10 MG CAPS Take 10 mg by mouth at bedtime.   methocarbamol (ROBAXIN) 750 MG tablet Take 750 mg by mouth daily as needed (pain).   metoprolol tartrate (LOPRESSOR) 100 MG tablet Take 2 hours prior to CT   Multiple Vitamins-Minerals (EYE VITAMINS PO) Take 1 capsule by mouth daily.   Omega-3 Fatty Acids (OMEGA-3 FISH OIL PO) Take 1,000 mg by mouth daily.   pantoprazole (PROTONIX) 40 MG tablet Take 1 tablet (40 mg total) by mouth 2 (two) times daily.   triamcinolone cream (KENALOG) 0.1 % Apply 1 application topically 2 (two) times  daily as needed. rash (Patient taking differently: Apply 1 application topically 2 (two) times daily as needed (arm rash).)     Allergies:   Atorvastatin, Ezetimibe, Polyoxyethylene 40 sorbitol septaoleate [sorbitan], Prednisone, Shingrix [zoster vac recomb adjuvanted], and Sulfamethoxazole-trimethoprim   Social History   Socioeconomic History   Marital status: Married    Spouse name: Not on file   Number of children: Not on file   Years of education: Not on file   Highest education level: Not on file  Occupational History   Not on file  Tobacco Use   Smoking status: Never   Smokeless tobacco: Never  Vaping Use   Vaping Use: Never used  Substance  and Sexual Activity   Alcohol use: Yes    Comment: once every 2 weeks   Drug use: No   Sexual activity: Yes    Birth control/protection: Surgical  Other Topics Concern   Not on file  Social History Narrative   Not on file   Social Determinants of Health   Financial Resource Strain: Low Risk    Difficulty of Paying Living Expenses: Not hard at all  Food Insecurity: No Food Insecurity   Worried About Charity fundraiser in the Last Year: Never true   Ran Out of Food in the Last Year: Never true  Transportation Needs: No Transportation Needs   Lack of Transportation (Medical): No   Lack of Transportation (Non-Medical): No  Physical Activity: Sufficiently Active   Days of Exercise per Week: 7 days   Minutes of Exercise per Session: 30 min  Stress: No Stress Concern Present   Feeling of Stress : Not at all  Social Connections: Moderately Integrated   Frequency of Communication with Friends and Family: Three times a week   Frequency of Social Gatherings with Friends and Family: Three times a week   Attends Religious Services: More than 4 times per year   Active Member of Clubs or Organizations: No   Attends Archivist Meetings: Never   Marital Status: Married     Family History: The patient's family history includes Brain cancer in her mother; Breast cancer (age of onset: 17) in her sister; Cancer in her father, maternal aunt, and sister; Diabetes in her father and maternal uncle; Heart disease in her maternal grandmother; Hypertension in her father.  ROS:   Review of Systems  Constitution: Negative for decreased appetite, fever and weight gain.  HENT: Negative for congestion, ear discharge, hoarse voice and sore throat.   Eyes: Negative for discharge, redness, vision loss in right eye and visual halos.  Cardiovascular: Reports chest pain.negative for dyspnea on exertion, leg swelling, orthopnea and palpitations.  Respiratory: Negative for cough, hemoptysis, shortness  of breath and snoring.   Endocrine: Negative for heat intolerance and polyphagia.  Hematologic/Lymphatic: Negative for bleeding problem. Does not bruise/bleed easily.  Skin: Negative for flushing, nail changes, rash and suspicious lesions.  Musculoskeletal: Negative for arthritis, joint pain, muscle cramps, myalgias, neck pain and stiffness.  Gastrointestinal: Negative for abdominal pain, bowel incontinence, diarrhea and excessive appetite.  Genitourinary: Negative for decreased libido, genital sores and incomplete emptying.  Neurological: Negative for brief paralysis, focal weakness, headaches and loss of balance.  Psychiatric/Behavioral: Negative for altered mental status, depression and suicidal ideas.  Allergic/Immunologic: Negative for HIV exposure and persistent infections.    EKGs/Labs/Other Studies Reviewed:    The following studies were reviewed today:   EKG:  The ekg ordered today demonstrates sinus rhythm, heart rate 69 bpm with  underlying left bundle branch block.  Compared to prior EKG, back to 2014 patient with LBBB at that time.  Recent Labs: 07/31/2021: B Natriuretic Peptide 43.4; TSH 2.930 08/01/2021: ALT 32; BUN 10; Creatinine, Ser 0.59; Hemoglobin 12.9; Magnesium 2.3; Platelets 265; Potassium 4.1; Sodium 140  Recent Lipid Panel    Component Value Date/Time   CHOL 188 08/01/2021 0444   TRIG 73 08/01/2021 0444   HDL 69 08/01/2021 0444   CHOLHDL 2.7 08/01/2021 0444   VLDL 15 08/01/2021 0444   LDLCALC 104 (H) 08/01/2021 0444   LDLDIRECT 97.0 06/06/2018 0812    Physical Exam:    VS:  BP 134/88 (BP Location: Right Arm)   Pulse 69   Ht 5\' 4"  (1.626 m)   Wt 170 lb 9.6 oz (77.4 kg)   BMI 29.28 kg/m     Wt Readings from Last 3 Encounters:  08/10/21 170 lb 9.6 oz (77.4 kg)  08/06/21 168 lb 4 oz (76.3 kg)  08/02/21 169 lb 15.6 oz (77.1 kg)     GEN: Well nourished, well developed in no acute distress HEENT: Normal NECK: No JVD; No carotid bruits LYMPHATICS: No  lymphadenopathy CARDIAC: S1S2 noted,RRR, no murmurs, rubs, gallops RESPIRATORY:  Clear to auscultation without rales, wheezing or rhonchi  ABDOMEN: Soft, non-tender, non-distended, +bowel sounds, no guarding. EXTREMITIES: No edema, No cyanosis, no clubbing MUSCULOSKELETAL:  No deformity  SKIN: Warm and dry NEUROLOGIC:  Alert and oriented x 3, non-focal PSYCHIATRIC:  Normal affect, good insight  ASSESSMENT:    1. Other chest pain   2. Pure hypercholesterolemia   3. Palpitations   4. Medication management    PLAN:     Chest pain- the symptoms chest pain is concerning, this patient does have intermediate risk for coronary artery disease and at this time I would like to pursue an ischemic evaluation in this patient.  Shared decision a coronary CTA at this time is appropriate.  I have discussed with the patient about the testing.  The patient has no IV contrast allergy and is agreeable to proceed with this test.  Type 2 diabetes-hemoglobin A1c which is -6.4.  She tells me that she has been able to control her diabetes greatly with no concerns.  This is being managed by primary team.  She is not on any statin medications she currently should be on a moderate intensity statin given the diagnosis of diabetes.  We have discussed if the patient actually is having any more coronary artery disease we will discuss further lipid-lowering agents which may include PCSK9 inhibitors.  She is recently had a TIA in his significant concern for paroxysmal atrial fibrillation I will place a monitor on the patient for 14 days.  If this is normal and there is a possibility she may need a loop recorder but we will discuss this after reviewing the monitor.  The patient is in agreement with the above plan. The patient left the office in stable condition.  The patient will follow up in 6 months or sooner if needed.    Medication Adjustments/Labs and Tests Ordered: Current medicines are reviewed at length with  the patient today.  Concerns regarding medicines are outlined above.  Orders Placed This Encounter  Procedures   CT CORONARY MORPH W/CTA COR W/SCORE W/CA W/CM &/OR WO/CM   Basic Metabolic Panel (BMET)   Magnesium   LONG TERM MONITOR (3-14 DAYS)   EKG 12-Lead   Meds ordered this encounter  Medications   metoprolol tartrate (LOPRESSOR) 100 MG  tablet    Sig: Take 2 hours prior to CT    Dispense:  1 tablet    Refill:  0    Patient Instructions  Medication Instructions:  Your physician recommends that you continue on your current medications as directed. Please refer to the Current Medication list given to you today.  *If you need a refill on your cardiac medications before your next appointment, please call your pharmacy*   Lab Work: Your physician recommends that you return for lab work in:  TODAY: BMET, [place] If you have labs (blood work) drawn today and your tests are completely normal, you will receive your results only by: MyChart Message (if you have [place]) OR A paper copy in the mail If you have any lab test that is abnormal or we need to change your treatment, we will call you to review the results.   Testing/Procedures:  [facility]- Long Term Monitor Instructions  Your physician has requested you wear a ZIO patch monitor for 14 days.  This is a single patch monitor. [employer] supplies one patch monitor per enrollment. Additional stickers are not available. Please do not apply patch if you will be having a Nuclear Stress Test,  Echocardiogram, Cardiac CT, MRI, or Chest Xray during the period you would be wearing the  monitor. The patch cannot be worn during these tests. You cannot remove and re-apply the  ZIO XT patch monitor.  Your ZIO patch monitor will be mailed 3 day USPS to your address on file. It may take 3-5 days  to receive your monitor after you have been enrolled.  Once you have received your monitor, please review the enclosed instructions. Your monitor  has  already been registered assigning a specific monitor serial # to you.  Billing and Patient Assistance Program Information  We have supplied [employer] with any of your insurance information on file for billing purposes. [employer] offers a sliding scale Patient Assistance Program for patients that do not have  insurance, or whose insurance does not completely cover the cost of the ZIO monitor.  You must apply for the Patient Assistance Program to qualify for this discounted rate.  To apply, please call [employer] at [phone], select option 4, select option 2, ask to apply for  Patient Assistance Program. Theodore Demark will ask your household income, and how many people  are in your household. They will quote your out-of-pocket cost based on that information.  [employer] will also be able to set up a 73-month, interest-free payment plan if needed.  Applying the monitor   Shave hair from upper left chest.  Hold abrader disc by orange tab. Rub abrader in 40 strokes over the upper left chest as  indicated in your monitor instructions.  Clean area with 4 enclosed alcohol pads. Let dry.  Apply patch as indicated in monitor instructions. Patch will be placed under collarbone on left  side of chest with arrow pointing upward.  Rub patch adhesive wings for 2 minutes. Remove white label marked "1". Remove the white  label marked "2". Rub patch adhesive wings for 2 additional minutes.  While looking in a mirror, press and release button in center of patch. A small green light will  flash 3-4 times. This will be your only indicator that the monitor has been turned on.  Do not shower for the first 24 hours. You may shower after the first 24 hours.  Press the button if you feel a symptom. You will hear a small click. Record  Date, Time and  Symptom in the Patient Logbook.  When you are ready to remove the patch, follow instructions on the last 2 pages of Patient  Logbook. Stick patch monitor onto the last page of  Patient Logbook.  Place Patient Logbook in the blue and white box. Use locking tab on box and tape box closed  securely. The blue and white box has prepaid postage on it. Please place it in the mailbox as  soon as possible. Your physician should have your test results approximately 7 days after the  monitor has been mailed back to [facility].  Call [place] at [phone] if you have questions regarding  your ZIO XT patch monitor. Call them immediately if you see an orange light blinking on your  monitor.  If your monitor falls off in less than 4 days, contact our Monitor department at [phone].  If your monitor becomes loose or falls off after 4 days call [employer] at [phone] for  suggestions on securing your monitor.    Your cardiac CT will be scheduled at one of the below locations:   [facility] [address] [place], [place] [address] [phone]  If scheduled at [facility], please arrive at the [facility] main entrance (entrance A) of [facility] 30 minutes prior to test start time. You can use the FREE valet parking offered at the main entrance (encouraged to control the heart rate for the test) Proceed to the [facility] Radiology Department (first floor) to check-in and test prep.   Please follow these instructions carefully (unless otherwise directed):   On the Night Before the Test: Be sure to Drink plenty of water. Do not consume any caffeinated/decaffeinated beverages or chocolate 12 hours prior to your test. Do not take any antihistamines 12 hours prior to your test.   On the Day of the Test: Drink plenty of water until 1 hour prior to the test. Do not eat any food 4 hours prior to the test. You may take your regular medications prior to the test.  Take metoprolol (Lopressor) two hours prior to test. FEMALES- please wear underwire-free bra if available, avoid dresses & tight clothing        After the Test: Drink plenty of water. After receiving IV contrast, you may experience a mild flushed feeling. This is normal. On occasion, you may experience a mild rash up to 24 hours after the test. This is not dangerous. If this occurs, you can take Benadryl 25 mg and increase your fluid intake. If you experience trouble breathing, this can be serious. If it is severe call 911 IMMEDIATELY. If it is mild, please call our office. If you take any of these medications: Glipizide/Metformin, Avandament, Glucavance, please do not take 48 hours after completing test unless otherwise instructed.  Please allow 2-4 weeks for scheduling of routine cardiac CTs. Some insurance companies require a pre-authorization which may delay scheduling of this test.   For non-scheduling related questions, please contact the cardiac imaging nurse navigator should you have any questions/concerns: Marchia Bond, Cardiac Imaging Nurse Navigator Gordy Clement, Cardiac Imaging Nurse Navigator [facility] Direct Office Dial: [phone]   For scheduling needs, including cancellations and rescheduling, please call [place], [phone].   Follow-Up: At [facility], you and your health needs are our priority.  As part of our continuing mission to provide you with exceptional heart care, we have created designated Provider Care Teams.  These Care  Teams include your primary Cardiologist (physician) and Advanced Practice Providers (APPs -  Physician Assistants and Nurse Practitioners) who all work together to provide you with the care you need, when you need it.  We recommend signing up for the patient portal called "MyChart".  Sign up information is provided on this After Visit Summary.  MyChart is used to connect with patients for Virtual Visits (Telemedicine).  Patients are able to view lab/test results, encounter notes, upcoming appointments, etc.  Non-urgent messages can be sent to your  provider as well.   To learn more about what you can do with MyChart, go to [email or web address].    Your next appointment:   6 month(s)  The format for your next appointment:   In Person  Provider:   Berniece Salines, DO   Other Instructions     Adopting a Healthy Lifestyle.  Know what a healthy weight is for you (roughly BMI <25) and aim to maintain this   Aim for 7+ servings of fruits and vegetables daily   65-80+ fluid ounces of water or unsweet tea for healthy kidneys   Limit to max 1 drink of alcohol per day; avoid smoking/tobacco   Limit animal fats in diet for cholesterol and heart health - choose grass fed whenever available   Avoid highly processed foods, and foods high in saturated/trans fats   Aim for low stress - take time to unwind and care for your mental health   Aim for 150 min of moderate intensity exercise weekly for heart health, and weights twice weekly for bone health   Aim for 7-9 hours of sleep daily   When it comes to diets, agreement about the perfect plan isnt easy to find, even among the experts. Experts at the [employer] developed an idea known as the Healthy Eating Plate. Just imagine a plate divided into logical, healthy portions.   The emphasis is on diet quality:   Load up on vegetables and fruits - one-half of your plate: Aim for color and variety, and remember that potatoes dont count.   Go for whole grains - one-quarter of your plate: Whole wheat, barley, wheat berries, quinoa, oats, brown rice, and foods made with them. If you want pasta, go with whole wheat pasta.   Protein power - one-quarter of your plate: Fish, chicken, beans, and nuts are all healthy, versatile protein sources. Limit red meat.   The diet, however, does go beyond the plate, offering a few other suggestions.   Use healthy plant oils, such as olive, canola, soy, corn, sunflower and peanut. Check the labels, and avoid partially hydrogenated  oil, which have unhealthy trans fats.   If youre thirsty, drink water. Coffee and tea are good in moderation, but skip sugary drinks and limit milk and dairy products to one or two daily servings.   The type of carbohydrate in the diet is more important than the amount. Some sources of carbohydrates, such as vegetables, fruits, whole grains, and beans-are healthier than others.   Finally, stay active  Signed, Berniece Salines, DO  08/10/2021 1:52 PM    [facility]

## 2021-08-10 NOTE — Patient Instructions (Addendum)
Medication Instructions:  Your physician recommends that you continue on your current medications as directed. Please refer to the Current Medication list given to you today.  *If you need a refill on your cardiac medications before your next appointment, please call your pharmacy*   Lab Work: Your physician recommends that you return for lab work in:  TODAY: BMET, [place] If you have labs (blood work) drawn today and your tests are completely normal, you will receive your results only by: MyChart Message (if you have [place]) OR A paper copy in the mail If you have any lab test that is abnormal or we need to change your treatment, we will call you to review the results.   Testing/Procedures:  [facility]- Long Term Monitor Instructions  Your physician has requested you wear a ZIO patch monitor for 14 days.  This is a single patch monitor. [employer] supplies one patch monitor per enrollment. Additional stickers are not available. Please do not apply patch if you will be having a Nuclear Stress Test,  Echocardiogram, Cardiac CT, MRI, or Chest Xray during the period you would be wearing the  monitor. The patch cannot be worn during these tests. You cannot remove and re-apply the  ZIO XT patch monitor.  Your ZIO patch monitor will be mailed 3 day USPS to your address on file. It may take 3-5 days  to receive your monitor after you have been enrolled.  Once you have received your monitor, please review the enclosed instructions. Your monitor  has already been registered assigning a specific monitor serial # to you.  Billing and Patient Assistance Program Information  We have supplied [employer] with any of your insurance information on file for billing purposes. [employer] offers a sliding scale Patient Assistance Program for patients that do not have  insurance, or whose insurance does not completely cover the cost of the ZIO monitor.  You must apply for the Patient Assistance Program to qualify for  this discounted rate.  To apply, please call [employer] at [phone], select option 4, select option 2, ask to apply for  Patient Assistance Program. Theodore Demark will ask your household income, and how many people  are in your household. They will quote your out-of-pocket cost based on that information.  [employer] will also be able to set up a 44-month, interest-free payment plan if needed.  Applying the monitor   Shave hair from upper left chest.  Hold abrader disc by orange tab. Rub abrader in 40 strokes over the upper left chest as  indicated in your monitor instructions.  Clean area with 4 enclosed alcohol pads. Let dry.  Apply patch as indicated in monitor instructions. Patch will be placed under collarbone on left  side of chest with arrow pointing upward.  Rub patch adhesive wings for 2 minutes. Remove white label marked "1". Remove the white  label marked "2". Rub patch adhesive wings for 2 additional minutes.  While looking in a mirror, press and release button in center of patch. A small green light will  flash 3-4 times. This will be your only indicator that the monitor has been turned on.  Do not shower for the first 24 hours. You may shower after the first 24 hours.  Press the button if you feel a symptom. You will hear a small click. Record Date, Time and  Symptom in the Patient Logbook.  When you are ready to remove the patch, follow instructions on the last 2 pages of Patient  Logbook. Stick  patch monitor onto the last page of Patient Logbook.  Place Patient Logbook in the blue and white box. Use locking tab on box and tape box closed  securely. The blue and white box has prepaid postage on it. Please place it in the mailbox as  soon as possible. Your physician should have your test results approximately 7 days after the  monitor has been mailed back to [facility].  Call [place] at [phone] if you have questions regarding  your ZIO XT patch monitor.  Call them immediately if you see an orange light blinking on your  monitor.  If your monitor falls off in less than 4 days, contact our Monitor department at [phone].  If your monitor becomes loose or falls off after 4 days call [employer] at [phone] for  suggestions on securing your monitor.    Your cardiac CT will be scheduled at one of the below locations:   [facility] [address] [place], [place] [address] [phone]  If scheduled at [facility], please arrive at the [facility] main entrance (entrance A) of [facility] 30 minutes prior to test start time. You can use the FREE valet parking offered at the main entrance (encouraged to control the heart rate for the test) Proceed to the [facility] Radiology Department (first floor) to check-in and test prep.   Please follow these instructions carefully (unless otherwise directed):   On the Night Before the Test: Be sure to Drink plenty of water. Do not consume any caffeinated/decaffeinated beverages or chocolate 12 hours prior to your test. Do not take any antihistamines 12 hours prior to your test.   On the Day of the Test: Drink plenty of water until 1 hour prior to the test. Do not eat any food 4 hours prior to the test. You may take your regular medications prior to the test.  Take metoprolol (Lopressor) two hours prior to test. FEMALES- please wear underwire-free bra if available, avoid dresses & tight clothing       After the Test: Drink plenty of water. After receiving IV contrast, you may experience a mild flushed feeling. This is normal. On occasion, you may experience a mild rash up to 24 hours after the test. This is not dangerous. If this occurs, you can take Benadryl 25 mg and increase your fluid intake. If you experience trouble breathing, this can be serious. If it is severe call 911 IMMEDIATELY. If it is mild, please call our office. If you take any of these  medications: Glipizide/Metformin, Avandament, Glucavance, please do not take 48 hours after completing test unless otherwise instructed.  Please allow 2-4 weeks for scheduling of routine cardiac CTs. Some insurance companies require a pre-authorization which may delay scheduling of this test.   For non-scheduling related questions, please contact the cardiac imaging nurse navigator should you have any questions/concerns: Marchia Bond, Cardiac Imaging Nurse Navigator Gordy Clement, Cardiac Imaging Nurse Navigator [facility] Direct Office Dial: [phone]   For scheduling needs, including cancellations and rescheduling, please call [place], [phone].   Follow-Up: At [facility], you and your health needs are our priority.  As part of our continuing mission to provide you with exceptional heart care, we have created designated Provider Care Teams.  These Care Teams include your primary Cardiologist (physician) and Advanced Practice Providers (APPs -  Physician Assistants and Nurse Practitioners) who all work together to provide you with the care you need,  when you need it.  We recommend signing up for the patient portal called "MyChart".  Sign up information is provided on this After Visit Summary.  MyChart is used to connect with patients for Virtual Visits (Telemedicine).  Patients are able to view lab/test results, encounter notes, upcoming appointments, etc.  Non-urgent messages can be sent to your provider as well.   To learn more about what you can do with MyChart, go to [email or web address].    Your next appointment:   6 month(s)  The format for your next appointment:   In Person  Provider:   Berniece Salines, DO   Other Instructions

## 2021-08-10 NOTE — Progress Notes (Unsigned)
Enrolled for [employer] to mail a ZIO XT long term holter monitor to the patients address on file.  

## 2021-08-17 ENCOUNTER — Telehealth: Payer: Self-pay | Admitting: Cardiology

## 2021-08-17 NOTE — Telephone Encounter (Signed)
Awaiting coronary CT results to determine pre-op clearance. CT scheduled for 08/20/2021.   Lenna Sciara, NP

## 2021-08-17 NOTE — Telephone Encounter (Signed)
° ° °  Pre-operative Risk Assessment    Patient Name: [name]  DOB: 1950-09-01 MRN: [id number]      Request for Surgical Clearance    Procedure:   left heel spur excision and achilles tendon repair  Date of Surgery:  Clearance 09/10/21                                 Surgeon:  Dr. Melrose Nakayama Surgeon's Group or Practice Name:  [facility] Phone number:  [phone] Fax number:  [phone]   Type of Clearance Requested:   - Medical  - Pharmacy:  Hold Aspirin defer to cards how long pt needs to held prior procedure    Type of Anesthesia:  General    Additional requests/questions:    Signed, Selinda Orion   08/17/2021, 10:36 AM

## 2021-08-19 ENCOUNTER — Telehealth (HOSPITAL_COMMUNITY): Payer: Self-pay | Admitting: Emergency Medicine

## 2021-08-19 NOTE — Telephone Encounter (Signed)
Reaching out to patient to offer assistance regarding upcoming cardiac imaging study; pt verbalizes understanding of appt date/time, parking situation and where to check in, pre-test NPO status and medications ordered, and verified current allergies; name and call back number provided for further questions should they arise Rose Bond RN Navigator Cardiac Imaging [facility] [phone] office [phone] cell  100mg  metoprolo ltartrate  Denies iv issues Arrival 330p

## 2021-08-20 ENCOUNTER — Telehealth: Payer: Self-pay | Admitting: Cardiology

## 2021-08-20 ENCOUNTER — Ambulatory Visit (HOSPITAL_COMMUNITY)
Admission: RE | Admit: 2021-08-20 | Discharge: 2021-08-20 | Disposition: A | Discharge status: Home / Self Care | Payer: Medicare Other | Source: Ambulatory Visit | Attending: Cardiology | Admitting: Cardiology

## 2021-08-20 ENCOUNTER — Other Ambulatory Visit: Payer: Self-pay

## 2021-08-20 ENCOUNTER — Encounter (HOSPITAL_COMMUNITY): Payer: Self-pay

## 2021-08-20 DIAGNOSIS — R0789 Other chest pain: Secondary | ICD-10-CM | POA: Diagnosis not present

## 2021-08-20 DIAGNOSIS — I2510 Atherosclerotic heart disease of native coronary artery without angina pectoris: Secondary | ICD-10-CM | POA: Diagnosis not present

## 2021-08-20 DIAGNOSIS — K760 Fatty (change of) liver, not elsewhere classified: Secondary | ICD-10-CM | POA: Insufficient documentation

## 2021-08-20 DIAGNOSIS — R002 Palpitations: Secondary | ICD-10-CM | POA: Diagnosis not present

## 2021-08-20 MED ORDER — NITROGLYCERIN 0.4 MG SL SUBL
SUBLINGUAL_TABLET | SUBLINGUAL | Status: AC
  Filled 2021-08-20: qty 2

## 2021-08-20 MED ORDER — IOHEXOL 350 MG/ML SOLN
95.0000 mL | Freq: Once | INTRAVENOUS | Status: AC | PRN
  Administered 2021-08-20: 16:00:00 95 mL via INTRAVENOUS

## 2021-08-20 MED ORDER — NITROGLYCERIN 0.4 MG SL SUBL
0.8000 mg | SUBLINGUAL_TABLET | Freq: Once | SUBLINGUAL | Status: AC
  Administered 2021-08-20: 16:00:00 0.8 mg via SUBLINGUAL

## 2021-08-20 NOTE — Telephone Encounter (Signed)
° °  [facility] Pre-operative Risk Assessment    Request for surgical clearance:  What type of surgery is being performed? Left heel spur excision and achilles tendin repair    When is this surgery scheduled? 09/10/2021   What type of clearance is required (medical clearance vs. Pharmacy clearance to hold med vs. Both)? both  Are there any medications that need to be held prior to surgery and how long? TBD   Practice name and name of physician performing surgery? Dr Melrose Nakayama   What is your office phone number [phone]    7.   What is your office fax number [phone]  8.   Anesthesia type (None, local, MAC, general) ? general   Milbert Coulter 08/20/2021, 9:26 AM  _________________________________________________________________   (provider comments below)

## 2021-08-21 NOTE — Telephone Encounter (Signed)
° °  Primary Cardiologist: Berniece Salines, DO  Chart reviewed as part of pre-operative protocol coverage. Given past medical history and time since last visit, based on ACC/AHA guidelines, [name] would be at acceptable risk for the planned procedure without further cardiovascular testing.   Her RCRI is a class II Risk, 0.9% risk of major cardiac event.   I will route this recommendation to the requesting party via Epic fax function and remove from pre-op pool.  Please call with questions.  [name]. Rose Wagman NP-C    08/21/2021, 11:09 AM [place] [address] Office [phone] Fax [phone]

## 2021-08-25 ENCOUNTER — Other Ambulatory Visit: Payer: Self-pay | Admitting: Family Medicine

## 2021-08-25 NOTE — Telephone Encounter (Signed)
Med was given at CPE on 07/20/21, #30g with 0 refills

## 2021-08-27 ENCOUNTER — Telehealth: Payer: Self-pay | Admitting: *Deleted

## 2021-08-27 NOTE — Telephone Encounter (Signed)
Received surgical clearance forms from [facility] asking PCP to clear pt for surgery. Surgery is scheduled on 09/10/20.  I see a phone note sent to Cardiology requesting them to clear her as well.   Pt just had an appt on 08/06/21, does pt need a Surgical Clearance appt or are you ok filling out forms from last appt, please advise

## 2021-08-27 NOTE — Telephone Encounter (Signed)
Form in your inbox for you to review

## 2021-08-27 NOTE — Telephone Encounter (Signed)
I should be able to do it but will depend if she needs any labs that we have not already done- I will take a look at the form.  She was already cleared by cardiology

## 2021-08-28 NOTE — Telephone Encounter (Signed)
Done and in IN box 

## 2021-08-28 NOTE — Telephone Encounter (Signed)
Form and last OV note faxed back

## 2021-09-07 ENCOUNTER — Other Ambulatory Visit: Payer: Self-pay | Admitting: Orthopaedic Surgery

## 2021-09-08 DIAGNOSIS — R002 Palpitations: Secondary | ICD-10-CM | POA: Diagnosis not present

## 2021-09-11 ENCOUNTER — Other Ambulatory Visit: Payer: Self-pay

## 2021-09-11 ENCOUNTER — Ambulatory Visit (INDEPENDENT_AMBULATORY_CARE_PROVIDER_SITE_OTHER): Payer: Medicare Other | Admitting: Family Medicine

## 2021-09-11 ENCOUNTER — Encounter: Payer: Self-pay | Admitting: Family Medicine

## 2021-09-11 VITALS — BP 142/80 | HR 79 | Temp 98.1°F | Ht 64.0 in | Wt 168.5 lb

## 2021-09-11 DIAGNOSIS — R1011 Right upper quadrant pain: Secondary | ICD-10-CM | POA: Diagnosis not present

## 2021-09-11 DIAGNOSIS — R232 Flushing: Secondary | ICD-10-CM | POA: Diagnosis not present

## 2021-09-11 DIAGNOSIS — F5104 Psychophysiologic insomnia: Secondary | ICD-10-CM | POA: Diagnosis not present

## 2021-09-11 DIAGNOSIS — K760 Fatty (change of) liver, not elsewhere classified: Secondary | ICD-10-CM

## 2021-09-11 DIAGNOSIS — G4700 Insomnia, unspecified: Secondary | ICD-10-CM | POA: Insufficient documentation

## 2021-09-11 NOTE — Patient Instructions (Addendum)
Keep track of your blood pressure  Check it when relaxed , both feet on the floor and arm at heart level   We will work on an appt for ultrasound of abdomen  Call us next week if you don't hear   Take care of yourself   Try and avoid fatty foods   We may want to consider SSRI later for sleep and hot flashes

## 2021-09-11 NOTE — Progress Notes (Signed)
Subjective:    Patient ID: [name], female    DOB: [birth year], 71 y.o.   MRN: [id number]  This visit occurred during the SARS-CoV-2 public health emergency.  Safety protocols were in place, including screening questions prior to the visit, additional usage of staff PPE, and extensive cleaning of exam room while observing appropriate contact time as indicated for disinfecting solutions.   HPI Pt presents for review of CT scan  Wt Readings from Last 3 Encounters:  09/11/21 168 lb 8 oz (76.4 kg)  08/10/21 170 lb 9.6 oz (77.4 kg)  08/06/21 168 lb 4 oz (76.3 kg)   28.92 kg/m  Pt was seen for chest pain by radiology last month  Evaluated with coronary CTA  Also monitor for 14 days (looking for afib in setting of possible TIA)  CT chest noted finding of hepatic steatosis as an incidental finding   She does notice discomfort in R upper abdomen  Finds herself rubbing it often/esp when she gets tired  Constant dull pressure sensation and occ rad to her back  Had ccy in the past and ercp  Has hot flashes ever since menopause  Does not sleep well -takes alprazolam   BP Readings from Last 3 Encounters:  09/11/21 (!) 142/80  08/20/21 101/70  08/10/21 134/88   Walks 30 minutes per day Pulse Readings from Last 3 Encounters:  09/11/21 79  08/20/21 63  08/10/21 69   Lab Results  Component Value Date   ALT 32 08/01/2021   AST 23 08/01/2021   ALKPHOS 57 08/01/2021   BILITOT 0.8 08/01/2021   No history of elevated lfts except for when she had ccy   Takes tylenol at night  Rarely drinks alcohol   Her diet is fair  Watching for both carbs and fat  It is hard    CT report CT CORONARY MORPH W/CTA COR W/SCORE W/CA W/CM &/OR WO/CM (Accession [id number]) (Order [id number]) Imaging Date: 08/20/2021 Department: [facility] Released By: Trula Slade Authorizing: Berniece Salines, DO   Exam Status  Status  Final [99]   PACS Intelerad Image  Link   Show images for CT CORONARY MORPH W/CTA COR W/SCORE W/CA W/CM &/OR WO/CM  Addendum  ADDENDUM REPORT: 08/20/2021 22:07   CLINICAL DATA:  58F with chest pain   EXAM: Cardiac/Coronary CTA   TECHNIQUE: The patient was scanned on a [employer].   FINDINGS: A 100 kV prospective scan was triggered in the descending thoracic aorta at 111 HU's. Axial non-contrast 3 mm slices were carried out through the heart. The data set was analyzed on a dedicated work station and scored using the [place]. Gantry rotation speed was 250 msecs and collimation was .6 mm. No beta blockade and 0.8 mg of sl NTG was given. The 3D data set was reconstructed in 5% intervals of the 67-82 % of the R-R cycle. Diastolic phases were analyzed on a dedicated work station using MPR, MIP and VRT modes. The patient received 80 cc of contrast.   Coronary Arteries:  Normal coronary origin.  Right dominance.   RCA is a large dominant artery that gives rise to PDA and PLA. There is no plaque.   Left main is a large artery that gives rise to LAD and LCX arteries. Noncalcified plaque in left main causes minimal (0-24%) stenosis   LAD is a large vessel. Noncalcified plaque in proximal LAD causes minimal (0-24%) stenosis   LCX is a non-dominant artery that  gives rise to one large OM1 branch. Noncalcified plaque in proximal LCX causes minimal (0-24%) stenosis   Other findings:   Left Ventricle: Normal size   Left Atrium: Normal size   Pulmonary Veins: Normal configuration   Right Ventricle: Normal size   Right Atrium: Normal size   Cardiac valves: No calcifications   Thoracic aorta: Normal size   Pulmonary Arteries: Normal size   Systemic Veins: Normal drainage   Pericardium: Normal thickness   IMPRESSION: 1.  Coronary calcium score of 0.   2.  Normal coronary origin with right dominance.   3.  Nonobstructive CAD   4. Noncalcified plaque in left main, proximal LAD, and  proximal LCX causes minimal (0-24%) stenosis   CAD-RADS 1. Minimal non-obstructive CAD (0-24%). Consider non-atherosclerotic causes of chest pain. Consider preventive therapy and risk factor modification.   Narrative & Impression  EXAM: OVER-READ INTERPRETATION  CT CHEST   The following report is an over-read performed by radiologist Dr. Aletta Edouard of [facility], [place] on 08/20/2021. This over-read does not include interpretation of cardiac or coronary anatomy or pathology. The coronary CTA interpretation by the cardiologist is attached.   COMPARISON:  None.   FINDINGS: Vascular: No significant noncardiac vascular findings.   Mediastinum/Nodes: Visualized mediastinum and hilar regions demonstrate no lymphadenopathy or masses.   Lungs/Pleura: Visualized lungs show no evidence of pulmonary edema, consolidation, pneumothorax, nodule or pleural fluid.   Upper Abdomen: The visualized liver demonstrates evidence of steatosis.   Musculoskeletal: No chest wall mass or suspicious bone lesions identified.   IMPRESSION: Hepatic steatosis.   Electronically Signed: By: Aletta Edouard M.D. On: 08/20/2021 16:30     Abd [place] from 2008 IMPRESSION:  1. No acute abdominal findings demonstrated.    2. Fatty infiltration of the liver with focal area of increased echogenicity in the left lobe as described. This is nonspecific but may reflect an area of focal fat. This could be further evaluated with CT or MRI if clinically warranted.    3. No biliary dilatation status post cholecystectomy.     Foot surgery scheduled on 1/31   Patient Active Problem List   Diagnosis Date Noted   Fatty liver 09/11/2021   Right upper quadrant abdominal pain 09/11/2021   Insomnia 09/11/2021   Hyperlipidemia 08/06/2021   Vaccine reaction 08/06/2021   Acute left-sided weakness 08/01/2021   GAD (generalized anxiety disorder) 08/01/2021   Current use of proton pump inhibitor 07/20/2021    Rash 07/20/2021   Colon cancer screening 07/20/2021   Hot flashes 07/11/2020   Subclinical hypothyroidism 07/11/2020   Allergy to statin medication 07/11/2020   Medicare annual wellness visit, subsequent 07/10/2019   Paresthesia 04/06/2019   Foot pain, bilateral 04/06/2019   Vaginal atrophy 06/09/2018   Breast cancer screening 05/31/2017   Prediabetes 06/02/2016   Estrogen deficiency 01/29/2015   Routine general medical examination at a health care facility 02/20/2012   GERD 06/20/2008   Past Medical History:  Diagnosis Date   Depression    Fatty liver    Fibrocystic disease of both breasts    GERD (gastroesophageal reflux disease)    Hyperglycemia    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   On continue HRT since 1980 per patient   Sphincter of Oddi dysfunction    Past Surgical History:  Procedure Laterality Date   ABDOMINAL HYSTERECTOMY     CARPAL TUNNEL RELEASE  08/2008   left   ERCP     LEFT HEART CATHETERIZATION WITH CORONARY ANGIOGRAM  N/A 06/08/2013   Procedure: LEFT HEART CATHETERIZATION WITH CORONARY ANGIOGRAM;  Surgeon: Ramond Dial, MD;  Location: [facility] CATH LAB;  Service: Cardiovascular;  Laterality: N/A;   MASTECTOMY     bilateral for severe macrocystic breast   SPINE SURGERY N/A 02/12/2020   neck   Social History   Tobacco Use   Smoking status: Never   Smokeless tobacco: Never  Vaping Use   Vaping Use: Never used  Substance Use Topics   Alcohol use: Yes    Comment: once every 2 weeks   Drug use: No   Family History  Problem Relation Age of Onset   Brain cancer Mother        hospice   Diabetes Father    Hypertension Father    Cancer Father        prostate and leukemia   Breast cancer Sister 64   Cancer Sister    Cancer Maternal Aunt        Pancreatic   Diabetes Maternal Uncle    Heart disease Maternal Grandmother    Allergies  Allergen Reactions   Atorvastatin Other (See Comments)    REACTION: rash   Ezetimibe Other (See  Comments)    REACTION: muscle aches   Polyoxyethylene 40 Sorbitol Septaoleate [Sorbitan] Other (See Comments)   Prednisone Other (See Comments)    **INJECTIONS ONLY** caused tachycardia    Shingrix [Zoster Vac Recomb Adjuvanted]     Reaction ?  Weakness on one side    Sulfamethoxazole-Trimethoprim Other (See Comments)    Swelling of nostrils   Current Outpatient Medications on File Prior to Visit  Medication Sig Dispense Refill   acetaminophen (TYLENOL) 500 MG tablet Take 1,000 mg by mouth at bedtime.     ALPRAZolam (XANAX) 1 MG tablet TAKE 1/2 TABLET BY MOUTH AT BEDTIME AS NEEDED FOR ANXIETY OR SLEEP (Patient taking differently: Take 0.5 mg by mouth at bedtime.) 15 tablet 3   ASPERCREME LIDOCAINE EX Apply 1 application topically at bedtime as needed (foot pain).     aspirin EC 81 MG tablet Take 81 mg by mouth daily. Swallow whole.     Calcium-Magnesium-Vitamin D (CALCIUM 1200+D3 PO) Take 1 tablet by mouth at bedtime.     diclofenac Sodium (VOLTAREN) 1 % GEL APPLY 4 GRAMS TO AFFECTED AREA 4 TIMES DAILY (Patient taking differently: Apply 4 g topically 4 (four) times daily as needed (foot pain).) 200 g 11   Melatonin 10 MG CAPS Take 10 mg by mouth at bedtime.     methocarbamol (ROBAXIN) 750 MG tablet Take 750 mg by mouth daily as needed (pain).     Multiple Vitamins-Minerals (EYE VITAMINS PO) Take 1 capsule by mouth daily.     Omega-3 Fatty Acids (OMEGA-3 FISH OIL PO) Take 1,000 mg by mouth daily.     pantoprazole (PROTONIX) 40 MG tablet Take 1 tablet (40 mg total) by mouth 2 (two) times daily. 180 tablet 3   triamcinolone cream (KENALOG) 0.1 % APPLY 1 APPLICATION TOPICALLY 2 (TWO) TIMES DAILY AS NEEDED. RASH 30 g 3   No current facility-administered medications on file prior to visit.    Review of Systems  Constitutional:  Negative for activity change, appetite change, fatigue, fever and unexpected weight change.  HENT:  Negative for congestion, ear pain, rhinorrhea, sinus pressure and  sore throat.   Eyes:  Negative for pain, redness and visual disturbance.  Respiratory:  Negative for cough, shortness of breath and wheezing.   Cardiovascular:  Negative for  chest pain and palpitations.  Gastrointestinal:  Positive for abdominal pain. Negative for abdominal distention, anal bleeding, blood in stool, constipation, diarrhea, nausea, rectal pain and vomiting.  Endocrine: Negative for polydipsia and polyuria.       Hot flashes  Genitourinary:  Negative for dysuria, frequency and urgency.  Musculoskeletal:  Negative for arthralgias, back pain and myalgias.  Skin:  Negative for pallor and rash.  Allergic/Immunologic: Negative for environmental allergies.  Neurological:  Negative for dizziness, syncope and headaches.  Hematological:  Negative for adenopathy. Does not bruise/bleed easily.  Psychiatric/Behavioral:  Positive for sleep disturbance. Negative for decreased concentration and dysphoric mood. The patient is not nervous/anxious.       Objective:   Physical Exam Constitutional:      General: She is not in acute distress.    Appearance: Normal appearance. She is well-developed and normal weight. She is not ill-appearing or diaphoretic.     Comments: overweight  HENT:     Head: Normocephalic and atraumatic.     Mouth/Throat:     Mouth: Mucous membranes are moist.  Eyes:     Conjunctiva/sclera: Conjunctivae normal.     Pupils: Pupils are equal, round, and reactive to light.  Neck:     Thyroid: No thyromegaly.     Vascular: No carotid bruit or JVD.  Cardiovascular:     Rate and Rhythm: Normal rate and regular rhythm.     Heart sounds: Normal heart sounds.    No gallop.  Pulmonary:     Effort: Pulmonary effort is normal. No respiratory distress.     Breath sounds: Normal breath sounds. No wheezing or rales.  Abdominal:     General: Bowel sounds are normal. There is no distension or abdominal bruit.     Palpations: Abdomen is soft. There is no mass.     Tenderness:  There is abdominal tenderness in the right upper quadrant. There is no right CVA tenderness, left CVA tenderness, guarding or rebound. Negative signs include Murphy's sign and McBurney's sign.     Hernia: No hernia is present.     Comments: Mild tenderness with RUQ palption   Musculoskeletal:     Cervical back: Normal range of motion and neck supple.     Right lower leg: No edema.     Left lower leg: No edema.  Lymphadenopathy:     Cervical: No cervical adenopathy.  Skin:    General: Skin is warm and dry.     Coloration: Skin is not pale.     Findings: No rash.  Neurological:     Mental Status: She is alert.     Coordination: Coordination normal.     Deep Tendon Reflexes: Reflexes are normal and symmetric. Reflexes normal.  Psychiatric:        Mood and Affect: Mood normal.          Assessment & Plan:   Problem List Items Addressed This Visit       Cardiovascular and Mediastinum   Hot flashes    Ever since starting menopause Uses alprazolam for sleep Would like to get her off of this in the future  May consider SSRI         Digestive   Fatty liver - Primary    Incidentally noted on her CT LFTs are normal  occ gets some RUQ abdominal pain- dull pressure sensation  H/o ccy and ercp in the past   abd [place] ordered        Relevant Orders  US ABDOMEN LIMITED RUQ (LIVER/GB)     Other   Right upper quadrant abdominal pain    Fatty liver change noted incidentally on CT [place] ordered Nl liver labs      Relevant Orders   US ABDOMEN LIMITED RUQ (LIVER/GB)   Insomnia    Uses alprazolam-disc inc risks of this med with age Also has hot flashes May consider trial of ssri in the future

## 2021-09-13 NOTE — Assessment & Plan Note (Signed)
Fatty liver change noted incidentally on CT [place] ordered Nl liver labs

## 2021-09-13 NOTE — Assessment & Plan Note (Signed)
Ever since starting menopause Uses alprazolam for sleep Would like to get her off of this in the future  May consider SSRI

## 2021-09-13 NOTE — Assessment & Plan Note (Signed)
Incidentally noted on her CT LFTs are normal  occ gets some RUQ abdominal pain- dull pressure sensation  H/o ccy and ercp in the past   abd [place] ordered

## 2021-09-13 NOTE — Assessment & Plan Note (Signed)
Uses alprazolam-disc inc risks of this med with age Also has hot flashes May consider trial of ssri in the future

## 2021-09-14 ENCOUNTER — Encounter: Payer: Self-pay | Admitting: *Deleted

## 2021-09-18 NOTE — Patient Instructions (Signed)
DUE TO COVID-19 ONLY ONE VISITOR IS ALLOWED TO COME WITH YOU AND STAY IN THE WAITING ROOM ONLY DURING PRE OP AND PROCEDURE.   **NO VISITORS ARE ALLOWED IN THE SHORT STAY AREA OR RECOVERY ROOM!!**  IF YOU WILL BE ADMITTED INTO THE HOSPITAL YOU ARE ALLOWED ONLY TWO SUPPORT PEOPLE DURING VISITATION HOURS ONLY (7 AM -8PM)   The support person(s) must pass our screening, gel in and out, and wear a mask at all times, including in the patients room. Patients must also wear a mask when staff or their support person are in the room. Visitors GUEST BADGE MUST BE WORN VISIBLY  One adult visitor may remain with you overnight and MUST be in the room by 8 P.M.  No visitors under the age of 21. Any visitor under the age of 109 must be accompanied by an adult.        Your procedure is scheduled on: 09/29/21   Report to [facility] Main Entrance    Report to admitting at : 7:45 AM   Call this number if you have problems the morning of surgery [phone]   Do not eat food :After Midnight.   May have liquids until : 7:00 AM   day of surgery  CLEAR LIQUID DIET  Foods Allowed                                                                     Foods Excluded  Water, Black Coffee and tea, regular and decaf                             liquids that you cannot  Plain Jell-O in any flavor  (No red)                                           see through such as: Fruit ices (not with fruit pulp)                                     milk, soups, orange juice              Iced Popsicles (No red)                                    All solid food                                   Apple juices Sports drinks like Gatorade (No red) Lightly seasoned clear broth or consume(fat free) Sugar  Sample Menu Breakfast                                Lunch  Supper Cranberry juice                    Beef broth                            Chicken broth Jell-O                                      Grape juice                           Apple juice Coffee or tea                        Jell-O                                      Popsicle                                                Coffee or tea                        Coffee or tea     Complete one Gatorade drink the morning of surgery 3 hours prior to scheduled surgery at: 7:00 AM.    The day of surgery:  Drink ONE (1) Pre-Surgery Clear Ensure or G2 at AM the morning of surgery. Drink in one sitting. Do not sip.  This drink was given to you during your hospital  pre-op appointment visit. Nothing else to drink after completing the  Pre-Surgery Clear Ensure or G2.          If you have questions, please contact your surgeons office.   Oral Hygiene is also important to reduce your risk of infection.                                    Remember - BRUSH YOUR TEETH THE MORNING OF SURGERY WITH YOUR REGULAR TOOTHPASTE   Do NOT smoke after Midnight   Take these medicines the morning of surgery with A SIP OF WATER: pantoprazole  DO NOT TAKE ANY ORAL DIABETIC MEDICATIONS DAY OF YOUR SURGERY                              You may not have any metal on your body including hair pins, jewelry, and body piercing             Do not wear make-up, lotions, powders, perfumes/cologne, or deodorant  Do not wear nail polish including gel and S&S, artificial/acrylic nails, or any other type of covering on natural nails including finger and toenails. If you have artificial nails, gel coating, etc. that needs to be removed by a nail salon please have this removed prior to surgery or surgery may need to be canceled/ delayed if the surgeon/ anesthesia feels like they are unable to be safely monitored.   Do not shave  48 hours prior to surgery.  Do not bring valuables to the hospital. [place].   Contacts, dentures or bridgework may not be worn into surgery.   Bring small overnight bag day of  surgery.    Patients discharged on the day of surgery will not be allowed to drive home.  Someone needs to stay with you for the first 24 hours after anesthesia.   Special Instructions: Bring a copy of your healthcare power of attorney and living will documents         the day of surgery if you haven't scanned them before.              Please read over the following fact sheets you were given: IF YOU HAVE QUESTIONS ABOUT YOUR PRE-OP INSTRUCTIONS PLEASE CALL [phone]     [facility] - Preparing for Surgery Before surgery, you can play an important role.  Because skin is not sterile, your skin needs to be as free of germs as possible.  You can reduce the number of germs on your skin by washing with CHG (chlorahexidine gluconate) soap before surgery.  CHG is an antiseptic cleaner which kills germs and bonds with the skin to continue killing germs even after washing. Please DO NOT use if you have an allergy to CHG or antibacterial soaps.  If your skin becomes reddened/irritated stop using the CHG and inform your nurse when you arrive at Short Stay. Do not shave (including legs and underarms) for at least 48 hours prior to the first CHG shower.  You may shave your face/neck. Please follow these instructions carefully:  1.  Shower with CHG Soap the night before surgery and the  morning of Surgery.  2.  If you choose to wash your hair, wash your hair first as usual with your  normal  shampoo.  3.  After you shampoo, rinse your hair and body thoroughly to remove the  shampoo.                           4.  Use CHG as you would any other liquid soap.  You can apply chg directly  to the skin and wash                       Gently with a scrungie or clean washcloth.  5.  Apply the CHG Soap to your body ONLY FROM THE NECK DOWN.   Do not use on face/ open                           Wound or open sores. Avoid contact with eyes, ears mouth and genitals (private parts).                       Wash face,  Genitals  (private parts) with your normal soap.             6.  Wash thoroughly, paying special attention to the area where your surgery  will be performed.  7.  Thoroughly rinse your body with warm water from the neck down.  8.  DO NOT shower/wash with your normal soap after using and rinsing off  the CHG Soap.                9.  Pat yourself  dry with a clean towel.            10.  Wear clean pajamas.            11.  Place clean sheets on your bed the night of your first shower and do not  sleep with pets. Day of Surgery : Do not apply any lotions/deodorants the morning of surgery.  Please wear clean clothes to the hospital/surgery center.  FAILURE TO FOLLOW THESE INSTRUCTIONS MAY RESULT IN THE CANCELLATION OF YOUR SURGERY PATIENT SIGNATURE_________________________________  NURSE SIGNATURE__________________________________  ________________________________________________________________________

## 2021-09-21 ENCOUNTER — Other Ambulatory Visit: Payer: Self-pay

## 2021-09-21 ENCOUNTER — Encounter (HOSPITAL_COMMUNITY): Payer: Self-pay

## 2021-09-21 ENCOUNTER — Encounter (HOSPITAL_COMMUNITY)
Admission: RE | Admit: 2021-09-21 | Discharge: 2021-09-21 | Disposition: A | Discharge status: Home / Self Care | Payer: Medicare Other | Source: Ambulatory Visit | Attending: Orthopaedic Surgery | Admitting: Orthopaedic Surgery

## 2021-09-21 VITALS — BP 130/80 | HR 71 | Temp 98.3°F | Ht 64.0 in | Wt 167.0 lb

## 2021-09-21 DIAGNOSIS — Z01812 Encounter for preprocedural laboratory examination: Secondary | ICD-10-CM | POA: Insufficient documentation

## 2021-09-21 DIAGNOSIS — K219 Gastro-esophageal reflux disease without esophagitis: Secondary | ICD-10-CM | POA: Diagnosis not present

## 2021-09-21 DIAGNOSIS — M7732 Calcaneal spur, left foot: Secondary | ICD-10-CM | POA: Insufficient documentation

## 2021-09-21 DIAGNOSIS — Z01818 Encounter for other preprocedural examination: Secondary | ICD-10-CM

## 2021-09-21 DIAGNOSIS — I447 Left bundle-branch block, unspecified: Secondary | ICD-10-CM | POA: Diagnosis not present

## 2021-09-21 HISTORY — DX: Anemia, unspecified: D64.9

## 2021-09-21 HISTORY — DX: Dyspnea, unspecified: R06.00

## 2021-09-21 HISTORY — DX: Nonspecific intraventricular block: I45.4

## 2021-09-21 HISTORY — DX: Other specified postprocedural states: Z98.890

## 2021-09-21 HISTORY — DX: Other complications of anesthesia, initial encounter: T88.59XA

## 2021-09-21 HISTORY — DX: Anxiety disorder, unspecified: F41.9

## 2021-09-21 HISTORY — DX: Nausea with vomiting, unspecified: R11.2

## 2021-09-21 HISTORY — DX: Prediabetes: R73.03

## 2021-09-21 LAB — CBC
HCT: 42.9 % (ref 36.0–46.0)
Hemoglobin: 13.8 g/dL (ref 12.0–15.0)
MCH: 28.3 pg (ref 26.0–34.0)
MCHC: 32.2 g/dL (ref 30.0–36.0)
MCV: 88.1 fL (ref 80.0–100.0)
Platelets: 303 10*3/uL (ref 150–400)
RBC: 4.87 MIL/uL (ref 3.87–5.11)
RDW: 13.6 % (ref 11.5–15.5)
WBC: 6.3 10*3/uL (ref 4.0–10.5)
nRBC: 0 % (ref 0.0–0.2)

## 2021-09-21 LAB — BASIC METABOLIC PANEL
Anion gap: 8 (ref 5–15)
BUN: 20 mg/dL (ref 8–23)
CO2: 25 mmol/L (ref 22–32)
Calcium: 9.4 mg/dL (ref 8.9–10.3)
Chloride: 106 mmol/L (ref 98–111)
Creatinine, Ser: 0.5 mg/dL (ref 0.44–1.00)
GFR, Estimated: >60 mL/min (ref >60)
Glucose, Bld: 108 mg/dL — ABNORMAL HIGH (ref 70–99)
Potassium: 3.9 mmol/L (ref 3.5–5.1)
Sodium: 139 mmol/L (ref 135–145)

## 2021-09-21 LAB — GLUCOSE, CAPILLARY: Glucose-Capillary: 128 mg/dL — ABNORMAL HIGH (ref 70–99)

## 2021-09-21 NOTE — Progress Notes (Signed)
COVID Vaccine Completed: Yes Date COVID Vaccine completed: 12/03/20 COVID vaccine manufacturer: 3- Pfizer ,   2-Moderna     COVID Test: N/A PCP - Dr. Loura Pardon Cardiologist - DO: Berniece Salines. LOV: 08/10/21  Chest x-ray - 07/31/21 EKG - 08/10/21 Stress Test -  ECHO - 08/01/21 Cardiac Cath - 06/08/13 Pacemaker/ICD device last checked:  Sleep Study -  CPAP -   Fasting Blood Sugar -  Checks Blood Sugar _____ times a day  Blood Thinner Instructions: Aspirin Instructions: It is on hold as per pharmacist instructions. Last Dose: 09/20/21  Anesthesia review: Hx: Chest pain,TIA's.  Patient denies shortness of breath, fever, cough and chest pain at PAT appointment   Patient verbalized understanding of instructions that were given to them at the PAT appointment. Patient was also instructed that they will need to review over the PAT instructions again at home before surgery.

## 2021-09-21 NOTE — Telephone Encounter (Signed)
[name] with [facility] called asking if you could switch pt location to [place] in [place].

## 2021-09-21 NOTE — Addendum Note (Signed)
Addended by: Virl Cagey on: 09/21/2021 10:30 AM   Modules accepted: Orders

## 2021-09-22 ENCOUNTER — Encounter: Payer: Self-pay | Admitting: Family Medicine

## 2021-09-22 ENCOUNTER — Ambulatory Visit
Admission: RE | Admit: 2021-09-22 | Discharge: 2021-09-22 | Disposition: A | Discharge status: Home / Self Care | Payer: Medicare Other | Source: Ambulatory Visit | Attending: Family Medicine | Admitting: Family Medicine

## 2021-09-22 DIAGNOSIS — K760 Fatty (change of) liver, not elsewhere classified: Secondary | ICD-10-CM

## 2021-09-22 DIAGNOSIS — R1011 Right upper quadrant pain: Secondary | ICD-10-CM

## 2021-09-22 NOTE — Anesthesia Preprocedure Evaluation (Addendum)
Anesthesia Evaluation  Patient identified by MRN, date of birth, ID band Patient awake    Reviewed: Allergy & Precautions, NPO status , Patient's Chart, lab work & pertinent test results, Unable to perform ROS - Chart review only  History of Anesthesia Complications (+) PONV and history of anesthetic complications (has done well last few surgeries)  Airway Mallampati: I  TM Distance: >3 FB Neck ROM: Limited    Dental  (+) Teeth Intact, Dental Advisory Given   Pulmonary    Pulmonary exam normal breath sounds clear to auscultation       Cardiovascular Normal cardiovascular exam+ dysrhythmias + Valvular Problems/Murmurs (mild MR) MR  Rhythm:Regular Rate:Normal  Echo 2022: 1. The LV is not seen very well except with Definity contrast. The LV  function is normal . . Left ventricular ejection fraction, by estimation,  is 55 to 60%. The left ventricle has normal function. The left ventricle  has no regional wall motion  abnormalities. Left ventricular diastolic parameters are indeterminate.  2. Right ventricular systolic function is normal. The right ventricular  size is normal. There is normal pulmonary artery systolic pressure.  3. The mitral valve is grossly normal. Mild mitral valve regurgitation.  4. The aortic valve was not well visualized. Aortic valve regurgitation  is trivial.  EKG: LAD, LBBB   Recent holter monitor Dec 2022- multiple episodes of SVT, has not started her metoprolol yet    Neuro/Psych PSYCHIATRIC DISORDERS Anxiety Depression    GI/Hepatic Neg liver ROS, GERD  Medicated and Controlled,  Endo/Other  diabetes (pre-diabetic)Hypothyroidism   Renal/GU negative Renal ROS  negative genitourinary   Musculoskeletal L heel spur   Abdominal   Peds  Hematology  (+) Blood dyscrasia, anemia ,   Anesthesia Other Findings   Reproductive/Obstetrics negative OB ROS                           Anesthesia Physical Anesthesia Plan  ASA: 3  Anesthesia Plan: General and Regional   Post-op Pain Management: Regional block and Tylenol PO (pre-op)   Induction: Intravenous  PONV Risk Score and Plan: 4 or greater and Ondansetron, Dexamethasone and Treatment may vary due to age or medical condition  Airway Management Planned: Oral ETT  Additional Equipment: None  Intra-op Plan:   Post-operative Plan: Extubation in OR  Informed Consent: I have reviewed the patients History and Physical, chart, labs and discussed the procedure including the risks, benefits and alternatives for the proposed anesthesia with the patient or authorized representative who has indicated his/her understanding and acceptance.     Dental advisory given  Plan Discussed with: CRNA  Anesthesia Plan Comments: (Prone  Will give metoprolol to try to abate any episodes of SVT )    Anesthesia Quick Evaluation

## 2021-09-22 NOTE — Progress Notes (Signed)
Anesthesia Chart Review   Case: [id number] Date/Time: 09/29/21 0948   Procedure: LEFTHEEL SPUR EXCISION AND ACHILLES TENDON REPAIR (Left: Heel)   Anesthesia type: Choice   Pre-op diagnosis: left heel spurs   Location: [facility] ROOM 06 / [facility]   Surgeons: Melrose Nakayama, MD       DISCUSSION:71 y.o. never smoker with h/o PONV, GERD, LBBB, left heel spurs scheduled for above procedure 09/29/2021 with Dr. Melrose Nakayama.   Per cardiology preoperative evaluation 08/21/2021, "Chart reviewed as part of pre-operative protocol coverage. Given past medical history and time since last visit, based on ACC/AHA guidelines, [name] would be at acceptable risk for the planned procedure without further cardiovascular testing.    Her RCRI is a class II Risk, 0.9% risk of major cardiac event."  Anticipate pt can proceed with planned procedure barring acute status change.   VS: BP 130/80    Pulse 71    Temp 36.8 C (Oral)    Ht 5\' 4"  (1.626 m)    Wt 75.8 kg    SpO2 96%    BMI 28.67 kg/m   PROVIDERS: Tower, Wynelle Fanny, MD is PCP   Primary Cardiologist: Berniece Salines, DO LABS: Labs reviewed: Acceptable for surgery. (all labs ordered are listed, but only abnormal results are displayed)  Labs Reviewed  GLUCOSE, CAPILLARY - Abnormal; Notable for the following components:      Result Value   Glucose-Capillary 128 (*)    All other components within normal limits  BASIC METABOLIC PANEL - Abnormal; Notable for the following components:   Glucose, Bld 108 (*)    All other components within normal limits  CBC     IMAGES:   EKG: 08/10/2021 Rate 69 bpm  NSR LAD LBBB  CV: Echo 08/01/2021  1. The LV is not seen very well except with Definity contrast. The LV  function is normal . . Left ventricular ejection fraction, by estimation,  is 55 to 60%. The left ventricle has normal function. The left ventricle  has no regional wall motion  abnormalities. Left ventricular diastolic parameters are  indeterminate.   2. Right ventricular systolic function is normal. The right ventricular  size is normal. There is normal pulmonary artery systolic pressure.   3. The mitral valve is grossly normal. Mild mitral valve regurgitation.   4. The aortic valve was not well visualized. Aortic valve regurgitation  is trivial.  Past Medical History:  Diagnosis Date   Anemia    Anxiety    BBB (bundle branch block)    Complication of anesthesia    Depression    Dyspnea    Fatty liver    Fibrocystic disease of both breasts    GERD (gastroesophageal reflux disease)    Hyperglycemia    PONV (postoperative nausea and vomiting)    Pre-diabetes    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   On continue HRT since 1980 per patient   Sphincter of Oddi dysfunction     Past Surgical History:  Procedure Laterality Date   ABDOMINAL HYSTERECTOMY     CARPAL TUNNEL RELEASE  08/2008   left   CHOLECYSTECTOMY     ERCP     LEFT HEART CATHETERIZATION WITH CORONARY ANGIOGRAM N/A 06/08/2013   Procedure: LEFT HEART CATHETERIZATION WITH CORONARY ANGIOGRAM;  Surgeon: Ramond Dial, MD;  Location: [facility] CATH LAB;  Service: Cardiovascular;  Laterality: N/A;   MASTECTOMY     bilateral for severe macrocystic breast   SPINE SURGERY N/A 02/12/2020  neck   TUBAL LIGATION      MEDICATIONS:  acetaminophen (TYLENOL) 500 MG tablet   ALPRAZolam (XANAX) 1 MG tablet   ASPERCREME LIDOCAINE EX   aspirin EC 81 MG tablet   Calcium-Magnesium-Vitamin D (CALCIUM 1200+D3 PO)   diclofenac Sodium (VOLTAREN) 1 % GEL   Melatonin 10 MG CAPS   methocarbamol (ROBAXIN) 750 MG tablet   Multiple Vitamins-Minerals (EYE VITAMINS PO)   Omega-3 Fatty Acids (OMEGA-3 FISH OIL PO)   pantoprazole (PROTONIX) 40 MG tablet   triamcinolone cream (KENALOG) 0.1 %   No current facility-administered medications for this encounter.     [name], PA-C [facility] [phone]

## 2021-09-25 ENCOUNTER — Other Ambulatory Visit: Payer: Self-pay

## 2021-09-25 MED ORDER — METOPROLOL SUCCINATE ER 25 MG PO TB24: 12.5000 mg | ORAL_TABLET | Freq: Every day | ORAL | 3 refills | Status: DC

## 2021-09-25 NOTE — Progress Notes (Signed)
Prescription sent to pharmacy.

## 2021-09-25 NOTE — Progress Notes (Signed)
Tests required by anesthesia which I agree are necessary

## 2021-09-28 NOTE — H&P (Signed)
[name] is an 71 y.o. female.   Chief Complaint: left heel pain  HPI: [name] persists with her terrible left heel pain.  She has brought in her husband, [name], today for backup.  She is interested in the surgery early next year.  She has trouble walking very far and some pain at rest.    Radiographs:  X-rays that were ordered, performed, and interpreted by me today included 2 views of the left heel which show retrocalcaneal spur and pump bump deformity.  No acute bony abnormalities are noted.  Past Medical History:  Diagnosis Date   Anemia    Anxiety    BBB (bundle branch block)    Complication of anesthesia    Depression    Dyspnea    Fatty liver    Fibrocystic disease of both breasts    GERD (gastroesophageal reflux disease)    Hyperglycemia    PONV (postoperative nausea and vomiting)    Pre-diabetes    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   On continue HRT since 1980 per patient   Sphincter of Oddi dysfunction     Past Surgical History:  Procedure Laterality Date   ABDOMINAL HYSTERECTOMY     CARPAL TUNNEL RELEASE  08/2008   left   CHOLECYSTECTOMY     ERCP     LEFT HEART CATHETERIZATION WITH CORONARY ANGIOGRAM N/A 06/08/2013   Procedure: LEFT HEART CATHETERIZATION WITH CORONARY ANGIOGRAM;  Surgeon: Ramond Dial, MD;  Location: [facility] CATH LAB;  Service: Cardiovascular;  Laterality: N/A;   MASTECTOMY     bilateral for severe macrocystic breast   SPINE SURGERY N/A 02/12/2020   neck   TUBAL LIGATION      Family History  Problem Relation Age of Onset   Brain cancer Mother        hospice   Diabetes Father    Hypertension Father    Cancer Father        prostate and leukemia   Breast cancer Sister 67   Cancer Sister    Cancer Maternal Aunt        Pancreatic   Diabetes Maternal Uncle    Heart disease Maternal Grandmother    Social History:  reports that she has never smoked. She has never used smokeless tobacco. She reports current alcohol use.  She reports that she does not use drugs.  Allergies:  Allergies  Allergen Reactions   Atorvastatin Other (See Comments)    REACTION: rash   Ezetimibe Other (See Comments)    REACTION: muscle aches   Polyoxyethylene 40 Sorbitol Septaoleate [Sorbitan] Other (See Comments)   Prednisone Other (See Comments)    **INJECTIONS ONLY** caused tachycardia    Shingrix [Zoster Vac Recomb Adjuvanted]     Reaction ?  Weakness on one side    Sulfamethoxazole-Trimethoprim Other (See Comments)    Swelling of nostrils    No medications prior to admission.    No results found for this or any previous visit (from the past 48 hour(s)). No results found.  Review of Systems  Musculoskeletal:  Positive for arthralgias.       Left heel  All other systems reviewed and are negative.  There were no vitals taken for this visit. Physical Exam Constitutional:      Appearance: Normal appearance.  HENT:     Head: Normocephalic and atraumatic.     Nose: Nose normal.     Mouth/Throat:     Pharynx: Oropharynx is clear.  Eyes:  Extraocular Movements: Extraocular movements intact.  Cardiovascular:     Rate and Rhythm: Normal rate and regular rhythm.  Pulmonary:     Effort: Pulmonary effort is normal.  Abdominal:     Palpations: Abdomen is soft.  Musculoskeletal:     Cervical back: Normal range of motion.     Comments: Left heel has a moderate prominence at the Achilles insertion which is very painful to palpation.  She also has some retrocalcaneal bursa pain.  Her calf is soft and nontender.  She has no forefoot pain.  She has palpable pulses in her feet and intact sensation and motor function on both sides.    Skin:    General: Skin is warm and dry.  Neurological:     General: No focal deficit present.     Mental Status: She is alert and oriented to person, place, and time. Mental status is at baseline.  Psychiatric:        Mood and Affect: Mood normal.        Behavior: Behavior normal.         Thought Content: Thought content normal.        Judgment: Judgment normal.     Assessment/Plan Assessment:  Left retrocalcaneal bursitis and Achilles insertional spur injected 2020  Plan: [name] would like to forward with the heel surgery.  I reviewed risk of anesthesia, infection, DVT, and Achilles rupture related to removal of her pump bump and insertional spur and subsequent repair of the Achilles.  I told her she is going to be nonweightbearing for probably 2-4 weeks.    Larwance Sachs Servando Kyllonen, PA-C 09/28/2021, 1:40 PM

## 2021-09-29 ENCOUNTER — Ambulatory Visit (HOSPITAL_COMMUNITY)
Admission: RE | Admit: 2021-09-29 | Discharge: 2021-09-29 | Disposition: A | Discharge status: Home / Self Care | Payer: Medicare Other | Attending: Orthopaedic Surgery | Admitting: Orthopaedic Surgery

## 2021-09-29 ENCOUNTER — Ambulatory Visit (HOSPITAL_COMMUNITY): Payer: Medicare Other | Admitting: Anesthesiology

## 2021-09-29 ENCOUNTER — Encounter (HOSPITAL_COMMUNITY): Payer: Self-pay | Admitting: Orthopaedic Surgery

## 2021-09-29 ENCOUNTER — Other Ambulatory Visit: Payer: Self-pay

## 2021-09-29 ENCOUNTER — Encounter (HOSPITAL_COMMUNITY)
Admission: RE | Disposition: A | Discharge status: Home / Self Care | Payer: Self-pay | Source: Home / Self Care | Attending: Orthopaedic Surgery

## 2021-09-29 ENCOUNTER — Ambulatory Visit (HOSPITAL_COMMUNITY): Payer: Medicare Other | Admitting: Physician Assistant

## 2021-09-29 DIAGNOSIS — M7732 Calcaneal spur, left foot: Secondary | ICD-10-CM | POA: Diagnosis not present

## 2021-09-29 DIAGNOSIS — M79672 Pain in left foot: Secondary | ICD-10-CM | POA: Diagnosis present

## 2021-09-29 DIAGNOSIS — K219 Gastro-esophageal reflux disease without esophagitis: Secondary | ICD-10-CM | POA: Diagnosis not present

## 2021-09-29 DIAGNOSIS — E039 Hypothyroidism, unspecified: Secondary | ICD-10-CM | POA: Diagnosis not present

## 2021-09-29 DIAGNOSIS — R7303 Prediabetes: Secondary | ICD-10-CM | POA: Insufficient documentation

## 2021-09-29 DIAGNOSIS — R262 Difficulty in walking, not elsewhere classified: Secondary | ICD-10-CM | POA: Insufficient documentation

## 2021-09-29 DIAGNOSIS — M7662 Achilles tendinitis, left leg: Secondary | ICD-10-CM | POA: Diagnosis not present

## 2021-09-29 DIAGNOSIS — D638 Anemia in other chronic diseases classified elsewhere: Secondary | ICD-10-CM | POA: Diagnosis not present

## 2021-09-29 DIAGNOSIS — G8918 Other acute postprocedural pain: Secondary | ICD-10-CM | POA: Diagnosis not present

## 2021-09-29 HISTORY — PX: HEEL SPUR RESECTION: SHX6410

## 2021-09-29 LAB — GLUCOSE, CAPILLARY: Glucose-Capillary: 112 mg/dL — ABNORMAL HIGH (ref 70–99)

## 2021-09-29 SURGERY — EXCISION, BONE SPUR, CALCANEUS
Anesthesia: Regional | Site: Heel | Laterality: Left

## 2021-09-29 MED ORDER — SUGAMMADEX SODIUM 200 MG/2ML IV SOLN
INTRAVENOUS | Status: DC | PRN
  Administered 2021-09-29: 400 mg via INTRAVENOUS

## 2021-09-29 MED ORDER — PROPOFOL 10 MG/ML IV BOLUS
INTRAVENOUS | Status: DC | PRN
Start: 2021-09-29 — End: 2021-09-29
  Administered 2021-09-29: 100 mg via INTRAVENOUS

## 2021-09-29 MED ORDER — ORAL CARE MOUTH RINSE: 15.0000 mL | Freq: Once | OROMUCOSAL | Status: AC

## 2021-09-29 MED ORDER — CHLORHEXIDINE GLUCONATE 0.12 % MT SOLN
15.0000 mL | Freq: Once | OROMUCOSAL | Status: AC
  Administered 2021-09-29: 15 mL via OROMUCOSAL

## 2021-09-29 MED ORDER — DEXAMETHASONE SODIUM PHOSPHATE 10 MG/ML IJ SOLN
INTRAMUSCULAR | Status: DC | PRN
  Administered 2021-09-29: 4 mg via INTRAVENOUS

## 2021-09-29 MED ORDER — PROPOFOL 10 MG/ML IV BOLUS
INTRAVENOUS | Status: AC
  Filled 2021-09-29: qty 20

## 2021-09-29 MED ORDER — DEXAMETHASONE SODIUM PHOSPHATE 10 MG/ML IJ SOLN
INTRAMUSCULAR | Status: AC
  Filled 2021-09-29: qty 1

## 2021-09-29 MED ORDER — METHOCARBAMOL 750 MG PO TABS: 750.0000 mg | ORAL_TABLET | Freq: Four times a day (QID) | ORAL | 0 refills | Status: DC | PRN

## 2021-09-29 MED ORDER — 0.9 % SODIUM CHLORIDE (POUR BTL) OPTIME
TOPICAL | Status: DC | PRN
  Administered 2021-09-29: 1000 mL

## 2021-09-29 MED ORDER — FENTANYL CITRATE PF 50 MCG/ML IJ SOSY
50.0000 ug | PREFILLED_SYRINGE | INTRAMUSCULAR | Status: DC
  Administered 2021-09-29 (×2): 50 ug via INTRAVENOUS
  Filled 2021-09-29: qty 2

## 2021-09-29 MED ORDER — PROMETHAZINE HCL 12.5 MG PO TABS: 12.5000 mg | ORAL_TABLET | Freq: Four times a day (QID) | ORAL | 0 refills | Status: DC | PRN

## 2021-09-29 MED ORDER — OXYCODONE HCL 5 MG PO TABS: 5.0000 mg | ORAL_TABLET | Freq: Once | ORAL | Status: DC | PRN

## 2021-09-29 MED ORDER — FENTANYL CITRATE (PF) 100 MCG/2ML IJ SOLN
INTRAMUSCULAR | Status: AC
  Filled 2021-09-29: qty 2

## 2021-09-29 MED ORDER — FENTANYL CITRATE (PF) 100 MCG/2ML IJ SOLN
INTRAMUSCULAR | Status: DC | PRN
  Administered 2021-09-29: 25 ug via INTRAVENOUS
  Administered 2021-09-29: 50 ug via INTRAVENOUS
  Administered 2021-09-29: 25 ug via INTRAVENOUS

## 2021-09-29 MED ORDER — CEFAZOLIN SODIUM-DEXTROSE 2-4 GM/100ML-% IV SOLN
2.0000 g | INTRAVENOUS | Status: AC
  Administered 2021-09-29: 2 g via INTRAVENOUS
  Filled 2021-09-29: qty 100

## 2021-09-29 MED ORDER — HYDROMORPHONE HCL 1 MG/ML IJ SOLN: 0.2500 mg | INTRAMUSCULAR | Status: DC | PRN

## 2021-09-29 MED ORDER — PHENYLEPHRINE HCL (PRESSORS) 10 MG/ML IV SOLN
INTRAVENOUS | Status: AC
  Filled 2021-09-29: qty 1

## 2021-09-29 MED ORDER — ONDANSETRON HCL 4 MG/2ML IJ SOLN
INTRAMUSCULAR | Status: AC
  Filled 2021-09-29: qty 2

## 2021-09-29 MED ORDER — DEXAMETHASONE SODIUM PHOSPHATE 10 MG/ML IJ SOLN
INTRAMUSCULAR | Status: DC | PRN
  Administered 2021-09-29: 10 mg

## 2021-09-29 MED ORDER — LIDOCAINE 2% (20 MG/ML) 5 ML SYRINGE
INTRAMUSCULAR | Status: DC | PRN
Start: 2021-09-29 — End: 2021-09-29
  Administered 2021-09-29: 20 mg via INTRAVENOUS

## 2021-09-29 MED ORDER — OXYCODONE HCL 5 MG/5ML PO SOLN: 5.0000 mg | Freq: Once | ORAL | Status: DC | PRN

## 2021-09-29 MED ORDER — MIDAZOLAM HCL 2 MG/2ML IJ SOLN
1.0000 mg | INTRAMUSCULAR | Status: DC
  Administered 2021-09-29 (×2): 1 mg via INTRAVENOUS
  Filled 2021-09-29: qty 2

## 2021-09-29 MED ORDER — LACTATED RINGERS IV SOLN: INTRAVENOUS | Status: DC

## 2021-09-29 MED ORDER — ROCURONIUM BROMIDE 10 MG/ML (PF) SYRINGE
PREFILLED_SYRINGE | INTRAVENOUS | Status: DC | PRN
  Administered 2021-09-29: 70 mg via INTRAVENOUS

## 2021-09-29 MED ORDER — HYDROCODONE-ACETAMINOPHEN 5-325 MG PO TABS: 1.0000 | ORAL_TABLET | Freq: Four times a day (QID) | ORAL | 0 refills | Status: DC | PRN

## 2021-09-29 MED ORDER — SUCCINYLCHOLINE CHLORIDE 200 MG/10ML IV SOSY
PREFILLED_SYRINGE | INTRAVENOUS | Status: AC
  Filled 2021-09-29: qty 10

## 2021-09-29 MED ORDER — METOPROLOL TARTRATE 5 MG/5ML IV SOLN
INTRAVENOUS | Status: AC
  Filled 2021-09-29: qty 5

## 2021-09-29 MED ORDER — ONDANSETRON HCL 4 MG/2ML IJ SOLN: 4.0000 mg | Freq: Once | INTRAMUSCULAR | Status: DC | PRN

## 2021-09-29 MED ORDER — AMISULPRIDE (ANTIEMETIC) 5 MG/2ML IV SOLN: 10.0000 mg | Freq: Once | INTRAVENOUS | Status: DC | PRN

## 2021-09-29 MED ORDER — ROPIVACAINE HCL 5 MG/ML IJ SOLN
INTRAMUSCULAR | Status: DC | PRN
Start: 2021-09-29 — End: 2021-09-29
  Administered 2021-09-29: 40 mL via PERINEURAL

## 2021-09-29 MED ORDER — PHENYLEPHRINE HCL (PRESSORS) 10 MG/ML IV SOLN
INTRAVENOUS | Status: DC | PRN
  Administered 2021-09-29: 80 ug via INTRAVENOUS

## 2021-09-29 MED ORDER — ROCURONIUM BROMIDE 10 MG/ML (PF) SYRINGE
PREFILLED_SYRINGE | INTRAVENOUS | Status: AC
  Filled 2021-09-29: qty 10

## 2021-09-29 MED ORDER — PHENYLEPHRINE HCL-NACL 20-0.9 MG/250ML-% IV SOLN
INTRAVENOUS | Status: DC | PRN
  Administered 2021-09-29: 30 ug/min via INTRAVENOUS

## 2021-09-29 MED ORDER — APREPITANT 40 MG PO CAPS
40.0000 mg | ORAL_CAPSULE | Freq: Once | ORAL | Status: AC
  Administered 2021-09-29: 40 mg via ORAL
  Filled 2021-09-29: qty 1

## 2021-09-29 SURGICAL SUPPLY — 57 items
BANDAGE ESMARK 6X9 LF (GAUZE/BANDAGES/DRESSINGS) ×1 IMPLANT
BLADE OSCILLATING/SAGITTAL (BLADE) ×2
BLADE SURG 15 STRL LF DISP TIS (BLADE) ×2 IMPLANT
BLADE SURG 15 STRL SS (BLADE) ×4
BLADE SW THK.38XMED LNG THN (BLADE) IMPLANT
BNDG ELASTIC 4X5.8 VLCR STR LF (GAUZE/BANDAGES/DRESSINGS) ×2 IMPLANT
BNDG ELASTIC 6X5.8 VLCR STR LF (GAUZE/BANDAGES/DRESSINGS) ×2 IMPLANT
BNDG ESMARK 6X9 LF (GAUZE/BANDAGES/DRESSINGS) ×2
BNDG GAUZE ELAST 4 BULKY (GAUZE/BANDAGES/DRESSINGS) ×2 IMPLANT
COVER MAYO STAND STRL (DRAPES) ×2 IMPLANT
CUFF TOURN SGL QUICK 24 (TOURNIQUET CUFF) ×2
CUFF TRNQT CYL 24X4X16.5-23 (TOURNIQUET CUFF) IMPLANT
DECANTER SPIKE VIAL GLASS SM (MISCELLANEOUS) IMPLANT
DRAPE U-SHAPE 47X51 STRL (DRAPES) ×2 IMPLANT
DRAPE U-SHAPE 76X120 STRL (DRAPES) ×2 IMPLANT
DRSG EMULSION OIL 3X3 NADH (GAUZE/BANDAGES/DRESSINGS) IMPLANT
DRSG PAD ABDOMINAL 8X10 ST (GAUZE/BANDAGES/DRESSINGS) ×2 IMPLANT
DURAPREP 26ML APPLICATOR (WOUND CARE) ×2 IMPLANT
ELECT REM PT RETURN 15FT ADLT (MISCELLANEOUS) ×2 IMPLANT
GAUZE 4X4 16PLY ~~LOC~~+RFID DBL (SPONGE) IMPLANT
GAUZE SPONGE 4X4 12PLY STRL (GAUZE/BANDAGES/DRESSINGS) ×2 IMPLANT
GLOVE SRG 8 PF TXTR STRL LF DI (GLOVE) ×2 IMPLANT
GLOVE SURG ENC MOIS LTX SZ8 (GLOVE) ×4 IMPLANT
GLOVE SURG UNDER POLY LF SZ8 (GLOVE) ×4
GOWN STRL REUS W/ TWL XL LVL3 (GOWN DISPOSABLE) ×2 IMPLANT
GOWN STRL REUS W/TWL XL LVL3 (GOWN DISPOSABLE) ×4
IMPL SYS BIOCOMP ACH SPEED (Anchor) ×1 IMPLANT
IMPLANT SYS BIOCOMP ACH SPEED (Anchor) ×2 IMPLANT
KIT BASIN OR (CUSTOM PROCEDURE TRAY) ×2 IMPLANT
NDL 1/2 CIR CATGUT .05X1.09 (NEEDLE) IMPLANT
NEEDLE 1/2 CIR CATGUT .05X1.09 (NEEDLE) ×2 IMPLANT
NEEDLE HYPO 22GX1.5 SAFETY (NEEDLE) IMPLANT
NS IRRIG 1000ML POUR BTL (IV SOLUTION) ×2 IMPLANT
PACK ORTHO EXTREMITY (CUSTOM PROCEDURE TRAY) ×2 IMPLANT
PAD CAST 4YDX4 CTTN HI CHSV (CAST SUPPLIES) ×1 IMPLANT
PADDING CAST COTTON 4X4 STRL (CAST SUPPLIES) ×2
PADDING CAST COTTON 6X4 STRL (CAST SUPPLIES) ×2 IMPLANT
PENCIL SMOKE EVACUATOR (MISCELLANEOUS) ×2 IMPLANT
SLEEVE SCD COMPRESS KNEE MED (STOCKING) IMPLANT
SPLINT PLASTER CAST XFAST 5X30 (CAST SUPPLIES) ×5 IMPLANT
SPLINT PLASTER XFAST SET 5X30 (CAST SUPPLIES) ×10
SPONGE T-LAP 18X18 ~~LOC~~+RFID (SPONGE) ×2 IMPLANT
STAPLER VISISTAT 35W (STAPLE) IMPLANT
STRIP CLOSURE SKIN 1/4X4 (GAUZE/BANDAGES/DRESSINGS) ×1 IMPLANT
SUT ETHIBOND 2 OS 4 DA (SUTURE) IMPLANT
SUT FIBERWIRE #2 38 T-5 BLUE (SUTURE)
SUT PROLENE 3 0 PS 2 (SUTURE) IMPLANT
SUT PROLENE 4 0 PS 2 18 (SUTURE) IMPLANT
SUT VIC AB 0 CT1 27 (SUTURE)
SUT VIC AB 0 CT1 27XBRD ANBCTR (SUTURE) IMPLANT
SUT VIC AB 0 SH 27 (SUTURE) IMPLANT
SUT VIC AB 2-0 SH 27 (SUTURE)
SUT VIC AB 2-0 SH 27XBRD (SUTURE) IMPLANT
SUT VIC AB 3-0 FS2 27 (SUTURE) IMPLANT
SUTURE FIBERWR #2 38 T-5 BLUE (SUTURE) IMPLANT
SYR CONTROL 10ML LL (SYRINGE) IMPLANT
UNDERPAD 30X36 HEAVY ABSORB (UNDERPADS AND DIAPERS) ×2 IMPLANT

## 2021-09-29 NOTE — Anesthesia Procedure Notes (Signed)
Anesthesia Regional Block: Popliteal block   Pre-Anesthetic Checklist: , timeout performed,  Correct Patient, Correct Site, Correct Laterality,  Correct Procedure, Correct Position, site marked,  Risks and benefits discussed,  Surgical consent,  Pre-op evaluation,  At surgeon's request and post-op pain management  Laterality: Left  Prep: Maximum Sterile Barrier Precautions used, chloraprep       Needles:  Injection technique: Single-shot  Needle Type: Echogenic Stimulator Needle     Needle Length: 9cm  Needle Gauge: 22     Additional Needles:   Procedures:,,,, ultrasound used (permanent image in chart),,    Narrative:  Start time: 09/29/2021 8:40 AM End time: 09/29/2021 8:45 AM Injection made incrementally with aspirations every 5 mL.  Performed by: Personally  Anesthesiologist: Pervis Hocking, DO  Additional Notes: Monitors applied. No increased pain on injection. No increased resistance to injection. Injection made in 5cc increments. Good needle visualization. Patient tolerated procedure well.

## 2021-09-29 NOTE — Interval H&P Note (Signed)
History and Physical Interval Note:  09/29/2021 9:08 AM  [name]  has presented today for surgery, with the diagnosis of left heel spurs.  The various methods of treatment have been discussed with the patient and family. After consideration of risks, benefits and other options for treatment, the patient has consented to  Procedure(s): Foyil (Left) as a surgical intervention.  The patient's history has been reviewed, patient examined, no change in status, stable for surgery.  I have reviewed the patient's chart and labs.  Questions were answered to the patient's satisfaction.     Hessie Dibble

## 2021-09-29 NOTE — Progress Notes (Signed)
Assisted Dr. Beth Finucane with left, ultrasound guided, popliteal, adductor canal block. Side rails up, monitors on throughout procedure. See vital signs in flow sheet. Tolerated Procedure well.  

## 2021-09-29 NOTE — Anesthesia Procedure Notes (Signed)
Anesthesia Regional Block: Adductor canal block   Pre-Anesthetic Checklist: , timeout performed,  Correct Patient, Correct Site, Correct Laterality,  Correct Procedure, Correct Position, site marked,  Risks and benefits discussed,  Surgical consent,  Pre-op evaluation,  At surgeon's request and post-op pain management  Laterality: Left  Prep: Maximum Sterile Barrier Precautions used, chloraprep       Needles:  Injection technique: Single-shot  Needle Type: Echogenic Stimulator Needle     Needle Length: 9cm  Needle Gauge: 22     Additional Needles:   Procedures:,,,, ultrasound used (permanent image in chart),,    Narrative:  Start time: 09/29/2021 8:45 AM End time: 09/29/2021 8:50 AM Injection made incrementally with aspirations every 5 mL.  Performed by: Personally  Anesthesiologist: Pervis Hocking, DO  Additional Notes: Monitors applied. No increased pain on injection. No increased resistance to injection. Injection made in 5cc increments. Good needle visualization. Patient tolerated procedure well.

## 2021-09-29 NOTE — Progress Notes (Signed)
Orthopedic Tech Progress Note Patient Details:  [name] [facility] 1951-04-01 [id number]  Patient ID: [name], female   DOB: [birth year], 71 y.o.   MRN: [id number]  [name] 09/29/2021, 11:41 AM Crutches sized and delivered to PACU

## 2021-09-29 NOTE — Transfer of Care (Signed)
Immediate Anesthesia Transfer of Care Note  Patient: [name]  Procedure(s) Performed: LEFT HEEL SPUR EXCISION AND ACHILLES TENDON REPAIR (Left: Heel)  Patient Location: PACU  Anesthesia Type:GA combined with regional for post-op pain  Level of Consciousness: awake, alert , oriented and patient cooperative  Airway & Oxygen Therapy: Patient Spontanous Breathing and Patient connected to nasal cannula oxygen  Post-op Assessment: Report given to RN and Post -op Vital signs reviewed and stable  Post vital signs: Reviewed and stable  Last Vitals:  Vitals Value Taken Time  BP 133/73 09/29/21 1120  Temp    Pulse 73 09/29/21 1122  Resp 16 09/29/21 1122  SpO2 93 % 09/29/21 1122  Vitals shown include unvalidated device data.  Last Pain:  Vitals:   09/29/21 0900  TempSrc:   PainSc: Asleep         Complications: No notable events documented.

## 2021-09-29 NOTE — Anesthesia Postprocedure Evaluation (Signed)
Anesthesia Post Note  Patient: [name]  Procedure(s) Performed: LEFT HEEL SPUR EXCISION AND ACHILLES TENDON REPAIR (Left: Heel)     Patient location during evaluation: PACU Anesthesia Type: Regional and General Level of consciousness: awake and alert, oriented and patient cooperative Pain management: pain level controlled Vital Signs Assessment: post-procedure vital signs reviewed and stable Respiratory status: spontaneous breathing, nonlabored ventilation and respiratory function stable Cardiovascular status: blood pressure returned to baseline and stable Postop Assessment: no apparent nausea or vomiting Anesthetic complications: no   No notable events documented.  Last Vitals:  Vitals:   09/29/21 1145 09/29/21 1152  BP: (!) 144/73 125/69  Pulse: 72 75  Resp: 15 12  Temp: (!) 36.4 C 36.4 C  SpO2: 93% 96%    Last Pain:  Vitals:   09/29/21 1152  TempSrc: Oral  PainSc: 0-No pain                 Pervis Hocking

## 2021-09-29 NOTE — Op Note (Signed)
NAMEBLESSYN, [place] MEDICAL RECORD NO: [id number] ACCOUNT NO: [id number] DATE OF BIRTH: 1951/05/29 FACILITY: [facility] LOCATION: [facility] PHYSICIAN: Monico Blitz. Rhona Raider, MD  Operative Report   DATE OF PROCEDURE: 09/29/2021  PREOPERATIVE DIAGNOSES:    1.  Left heel Achilles insertional spur. 2.  Left heel pump bump.  POSTOPERATIVE DIAGNOSES:    1.  Left heel Achilles insertional spur. 2.  Left heel pump bump.  PROCEDURE:   1.  Left heel spur excision. 2.  Left heel Achilles repair.  ANESTHESIA:  General.  ATTENDING SURGEON:  Monico Blitz. Rhona Raider, MD.  ASSISTANT:  Loni Dolly, PA.  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  This is persistent despite various conservative measures including bracing and immobilization and injection.  She has also been  through physical therapy.  She is offered excision of the spurs and repair of her Achilles at this point.  Informed operative consent was obtained after discussion of possible complications including reaction to anesthesia, infection, and Achilles  rupture.  SUMMARY OF FINDINGS AND PROCEDURE: Under general anesthesia, through a posterior approach, we removed the Achilles insertional spur and the pump bump and then repaired the Achilles tendon using the Achilles SpeedBridge set from [employer].  I used  fluoroscopy throughout the case to make appropriate intraoperative decisions and read all these views myself.  Loni Dolly, assisted throughout and was invaluable to the completion of the case, mostly in that he helped pass instruments and retract,  thereby minimizing tourniquet and OR time.  The patient was scheduled to go home same day.  DESCRIPTION OF PROCEDURE:  The patient was taken to the operating suite where general anesthetic was applied without difficulty.  She was then positioned prone.  All bony prominences were appropriately padded and chest rolls were  used.  She was then  prepped and draped in normal sterile fashion.  After the administration of preoperative IV Kefzol and appropriate timeout, the left leg was elevated, exsanguinated, and a tourniquet inflated about the calf.  I made a posteromedial longitudinal incision  near the Achilles insertion with dissection down to the Achilles.  This was then split midline.  This exposed the insertional spur and the pump bump which we removed using an oscillating saw.  I used fluoroscopy to confirm adequate resection of both of  these spurs.  We detached maybe 60% of the Achilles tendon in the process.  We then used the SpeedBridge set from [employer] to secure the tendon back down to the bleeding bed of bone.  I placed 2 medial SwiveLock sutures, which were passed up through both  aspects of the tendon.  We then crossed these and used the smaller anchors to secure the tendon down to the bone in suture-bridge fashion.  This gave [place] a nice tight repair.  I took one of the rescue sutures from one of the anchors and passed that up  through the tendon in a [place]ell fashion for added stability.  The wound was then irrigated, followed by reapproximation of the subcutaneous tissues with 2-0 undyed Vicryl.  Skin was closed with a subcuticular stitch and Steri-Strips.  The tourniquet was  deflated during closure and skin edges became pink and warm.  We placed Adaptic followed by dry gauze and a posterior splint of plaster with the ankle in slight plantar flexion.  Estimated blood loss and intraoperative fluids can be obtained from  anesthesia records as can accurate tourniquet  time.  DISPOSITION:  The patient was extubated in the operating room and taken to recovery room in stable condition.  Plans were for her to go home same day and follow up in the office in less than week.  I will contact her by phone tonight.      PAA D: 09/29/2021 10:59:44 am T: 09/29/2021 11:53:00 pm  JOB: [id number]/ [id number]

## 2021-09-29 NOTE — Anesthesia Procedure Notes (Addendum)
Procedure Name: Intubation Date/Time: 09/29/2021 9:59 AM Performed by: Cleda Daub, CRNA Pre-anesthesia Checklist: Patient identified, Emergency Drugs available, Suction available and Patient being monitored Patient Re-evaluated:Patient Re-evaluated prior to induction Oxygen Delivery Method: Circle system utilized Preoxygenation: Pre-oxygenation with 100% oxygen Induction Type: IV induction Ventilation: Mask ventilation without difficulty Laryngoscope Size: Glidescope and 3 (stiff neck; symtomatic; no neck manipuation during intubation.) Grade View: Grade I Tube type: Oral Number of attempts: 1 Airway Equipment and Method: Stylet and Oral airway Placement Confirmation: ETT inserted through vocal cords under direct vision, positive ETCO2 and breath sounds checked- equal and bilateral Secured at: 21 cm Tube secured with: Tape Dental Injury: Teeth and Oropharynx as per pre-operative assessment

## 2021-09-29 NOTE — Brief Op Note (Signed)
[name] [id number] 09/29/2021   PRE-OP DIAGNOSIS: left heel spurs and pump bump  POST-OP DIAGNOSIS: same  PROCEDURE: left heel spur excisions and TAR  ANESTHESIA: general  Hessie Dibble   Dictation #:  [id number]

## 2021-10-05 ENCOUNTER — Encounter (HOSPITAL_COMMUNITY): Payer: Self-pay | Admitting: Orthopaedic Surgery

## 2021-10-06 NOTE — Progress Notes (Signed)
Necessary and required by anesthesia

## 2021-10-07 DIAGNOSIS — Z9889 Other specified postprocedural states: Secondary | ICD-10-CM | POA: Diagnosis not present

## 2021-10-16 NOTE — Progress Notes (Signed)
I have no familiarity with this medication.  Must be something anesthesia felt necessary to use.

## 2021-11-21 ENCOUNTER — Other Ambulatory Visit: Payer: Self-pay | Admitting: Family Medicine

## 2021-11-23 NOTE — Telephone Encounter (Signed)
Name of Medication: Xanax ?Name of Pharmacy: [facility] ?Last Fill or Written Date and Quantity: 07/28/21 #15 tabs with 3 refills ?Last Office Visit and Type: f/u 09/11/21 ?Next Office Visit and Type: none scheduled ?

## 2021-11-30 DIAGNOSIS — Z9889 Other specified postprocedural states: Secondary | ICD-10-CM | POA: Diagnosis not present

## 2021-12-03 DIAGNOSIS — M7662 Achilles tendinitis, left leg: Secondary | ICD-10-CM | POA: Diagnosis not present

## 2021-12-08 DIAGNOSIS — E119 Type 2 diabetes mellitus without complications: Secondary | ICD-10-CM | POA: Diagnosis not present

## 2021-12-08 DIAGNOSIS — H5203 Hypermetropia, bilateral: Secondary | ICD-10-CM | POA: Diagnosis not present

## 2021-12-08 DIAGNOSIS — H43813 Vitreous degeneration, bilateral: Secondary | ICD-10-CM | POA: Diagnosis not present

## 2021-12-08 DIAGNOSIS — H2513 Age-related nuclear cataract, bilateral: Secondary | ICD-10-CM | POA: Diagnosis not present

## 2021-12-08 LAB — HM DIABETES EYE EXAM

## 2021-12-09 DIAGNOSIS — M7662 Achilles tendinitis, left leg: Secondary | ICD-10-CM | POA: Diagnosis not present

## 2021-12-16 DIAGNOSIS — Z1231 Encounter for screening mammogram for malignant neoplasm of breast: Secondary | ICD-10-CM | POA: Diagnosis not present

## 2021-12-16 DIAGNOSIS — M7662 Achilles tendinitis, left leg: Secondary | ICD-10-CM | POA: Diagnosis not present

## 2021-12-16 LAB — HM MAMMOGRAPHY

## 2021-12-18 DIAGNOSIS — M7662 Achilles tendinitis, left leg: Secondary | ICD-10-CM | POA: Diagnosis not present

## 2021-12-21 DIAGNOSIS — M7662 Achilles tendinitis, left leg: Secondary | ICD-10-CM | POA: Diagnosis not present

## 2021-12-24 DIAGNOSIS — M7662 Achilles tendinitis, left leg: Secondary | ICD-10-CM | POA: Diagnosis not present

## 2021-12-28 DIAGNOSIS — M7662 Achilles tendinitis, left leg: Secondary | ICD-10-CM | POA: Diagnosis not present

## 2021-12-30 DIAGNOSIS — M7662 Achilles tendinitis, left leg: Secondary | ICD-10-CM | POA: Diagnosis not present

## 2022-01-04 DIAGNOSIS — M7662 Achilles tendinitis, left leg: Secondary | ICD-10-CM | POA: Diagnosis not present

## 2022-01-06 ENCOUNTER — Encounter: Payer: Self-pay | Admitting: Family Medicine

## 2022-01-11 DIAGNOSIS — M7662 Achilles tendinitis, left leg: Secondary | ICD-10-CM | POA: Diagnosis not present

## 2022-03-23 ENCOUNTER — Other Ambulatory Visit: Payer: Self-pay | Admitting: Family Medicine

## 2022-03-23 NOTE — Telephone Encounter (Signed)
Last filled on 02/22/22 Last ov 09/11/21

## 2022-04-05 ENCOUNTER — Ambulatory Visit: Payer: Medicare Other | Admitting: Cardiology

## 2022-04-05 ENCOUNTER — Encounter: Payer: Self-pay | Admitting: Cardiology

## 2022-04-05 VITALS — BP 140/86 | HR 76 | Ht 64.0 in | Wt 167.2 lb

## 2022-04-05 DIAGNOSIS — R030 Elevated blood-pressure reading, without diagnosis of hypertension: Secondary | ICD-10-CM

## 2022-04-05 DIAGNOSIS — E7800 Pure hypercholesterolemia, unspecified: Secondary | ICD-10-CM | POA: Diagnosis not present

## 2022-04-05 DIAGNOSIS — I2510 Atherosclerotic heart disease of native coronary artery without angina pectoris: Secondary | ICD-10-CM | POA: Diagnosis not present

## 2022-04-05 DIAGNOSIS — R7303 Prediabetes: Secondary | ICD-10-CM | POA: Diagnosis not present

## 2022-04-05 NOTE — Progress Notes (Signed)
Cardiology Office Note:    Date:  04/05/2022   ID:  [name], DOB 06-Apr-1951, MRN [id number]  PCP:  Abner Greenspan, MD  Cardiologist:  Berniece Salines, DO  Electrophysiologist:  None   Referring MD: Abner Greenspan, MD   " I am doing fine"  History of Present Illness:    [name] is a 71 y.o. 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  For saw the patient in December 2022 she was experiencing intermittent chest discomfort I sent her for coronary CTA which showed minimal coronary artery disease.  Since I saw the patient she has had a full surgery.  She is doing well.  She offers no complaints at this time.  Past Medical History:  Diagnosis Date   Anemia    Anxiety    BBB (bundle branch block)    Complication of anesthesia    Depression    Dyspnea    Fatty liver    Fibrocystic disease of both breasts    GERD (gastroesophageal reflux disease)    Hyperglycemia    PONV (postoperative nausea and vomiting)    Pre-diabetes    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   On continue HRT since 1980 per patient   Sphincter of Oddi dysfunction     Past Surgical History:  Procedure Laterality Date   ABDOMINAL HYSTERECTOMY     CARPAL TUNNEL RELEASE  08/2008   left   CHOLECYSTECTOMY     ERCP     HEEL SPUR RESECTION Left 09/29/2021   Procedure: LEFT HEEL SPUR EXCISION AND ACHILLES TENDON REPAIR;  Surgeon: Melrose Nakayama, MD;  Location: [facility];  Service: Orthopedics;  Laterality: Left;   LEFT HEART CATHETERIZATION WITH CORONARY ANGIOGRAM N/A 06/08/2013   Procedure: LEFT HEART CATHETERIZATION WITH CORONARY ANGIOGRAM;  Surgeon: Ramond Dial, MD;  Location: [facility] CATH LAB;  Service: Cardiovascular;  Laterality: N/A;   MASTECTOMY     bilateral for severe macrocystic breast   SPINE SURGERY N/A 02/12/2020   neck   TUBAL LIGATION      Current  Medications: Current Meds  Medication Sig   acetaminophen (TYLENOL) 500 MG tablet Take 1,000 mg by mouth at bedtime.   ALPRAZolam (XANAX) 1 MG tablet TAKE 1/2 TABLET BY MOUTH AT BEDTIME AS NEEDED FOR ANXIETY OR SLEEP   ASPERCREME LIDOCAINE EX Apply 1 application topically at bedtime as needed (foot pain).   aspirin EC 81 MG tablet Take 81 mg by mouth daily. Swallow whole.   Calcium-Magnesium-Vitamin D (CALCIUM 1200+D3 PO) Take 1 tablet by mouth at bedtime.   diclofenac Sodium (VOLTAREN) 1 % GEL APPLY 4 GRAMS TO AFFECTED AREA 4 TIMES DAILY (Patient taking differently: Apply 4 g topically 4 (four) times daily as needed (foot pain).)   Melatonin 10 MG CAPS Take 10 mg by mouth at bedtime.   methocarbamol (ROBAXIN) 750 MG tablet Take 1 tablet (750 mg total) by mouth every 6 (six) hours as needed for muscle spasms (& pain).   metoprolol succinate (TOPROL-XL) 25 MG 24 hr tablet Take 0.5 tablets (12.5 mg total) by mouth daily.   Multiple Vitamins-Minerals (EYE VITAMINS PO) Take 1 capsule by mouth daily.   Omega-3 Fatty Acids (OMEGA-3 FISH OIL PO) Take 1,000 mg by mouth daily.   pantoprazole (PROTONIX) 40 MG tablet Take 1 tablet (40 mg total) by mouth 2 (two) times daily.   triamcinolone  cream (KENALOG) 0.1 % APPLY 1 APPLICATION TOPICALLY 2 (TWO) TIMES DAILY AS NEEDED. RASH     Allergies:   Atorvastatin, Ezetimibe, Polyoxyethylene 40 sorbitol septaoleate [sorbitan], Prednisone, Shingrix [zoster vac recomb adjuvanted], and Sulfamethoxazole-trimethoprim   Social History   Socioeconomic History   Marital status: Married    Spouse name: Not on file   Number of children: Not on file   Years of education: Not on file   Highest education level: Not on file  Occupational History   Not on file  Tobacco Use   Smoking status: Never   Smokeless tobacco: Never  Vaping Use   Vaping Use: Never used  Substance and Sexual Activity   Alcohol use: Yes    Comment: once every 2 weeks   Drug use: No   Sexual  activity: Yes    Birth control/protection: Surgical  Other Topics Concern   Not on file  Social History Narrative   Not on file   Social Determinants of Health   Financial Resource Strain: Low Risk  (07/16/2021)   Overall Financial Resource Strain (CARDIA)    Difficulty of Paying Living Expenses: Not hard at all  Food Insecurity: No Food Insecurity (07/16/2021)   Hunger Vital Sign    Worried About Running Out of Food in the Last Year: Never true    Ran Out of Food in the Last Year: Never true  Transportation Needs: No Transportation Needs (07/16/2021)   PRAPARE - Hydrologist (Medical): No    Lack of Transportation (Non-Medical): No  Physical Activity: Sufficiently Active (07/16/2021)   Exercise Vital Sign    Days of Exercise per Week: 7 days    Minutes of Exercise per Session: 30 min  Stress: No Stress Concern Present (07/16/2021)   [place]    Feeling of Stress : Not at all  Social Connections: Moderately Integrated (07/16/2021)   Social Connection and Isolation Panel [NHANES]    Frequency of Communication with Friends and Family: Three times a week    Frequency of Social Gatherings with Friends and Family: Three times a week    Attends Religious Services: More than 4 times per year    Active Member of Clubs or Organizations: No    Attends Archivist Meetings: Never    Marital Status: Married     Family History: The patient's family history includes Brain cancer in her mother; Breast cancer (age of onset: 30) in her sister; Cancer in her father, maternal aunt, and sister; Diabetes in her father and maternal uncle; Heart disease in her maternal grandmother; Hypertension in her father.  ROS:   Review of Systems  Constitution: Negative for decreased appetite, fever and weight gain.  HENT: Negative for congestion, ear discharge, hoarse voice and sore throat.   Eyes:  Negative for discharge, redness, vision loss in right eye and visual halos.  Cardiovascular: Negative for chest pain, dyspnea on exertion, leg swelling, orthopnea and palpitations.  Respiratory: Negative for cough, hemoptysis, shortness of breath and snoring.   Endocrine: Negative for heat intolerance and polyphagia.  Hematologic/Lymphatic: Negative for bleeding problem. Does not bruise/bleed easily.  Skin: Negative for flushing, nail changes, rash and suspicious lesions.  Musculoskeletal: Negative for arthritis, joint pain, muscle cramps, myalgias, neck pain and stiffness.  Gastrointestinal: Negative for abdominal pain, bowel incontinence, diarrhea and excessive appetite.  Genitourinary: Negative for decreased libido, genital sores and incomplete emptying.  Neurological: Negative for brief paralysis, focal  weakness, headaches and loss of balance.  Psychiatric/Behavioral: Negative for altered mental status, depression and suicidal ideas.  Allergic/Immunologic: Negative for HIV exposure and persistent infections.    EKGs/Labs/Other Studies Reviewed:    The following studies were reviewed today:   EKG:  None today   CCTA 07/2021   Coronary Arteries:  Normal coronary origin.  Right dominance.   RCA is a large dominant artery that gives rise to PDA and PLA. There is no plaque.   Left main is a large artery that gives rise to LAD and LCX arteries. Noncalcified plaque in left main causes minimal (0-24%) stenosis   LAD is a large vessel. Noncalcified plaque in proximal LAD causes minimal (0-24%) stenosis   LCX is a non-dominant artery that gives rise to one large OM1 branch. Noncalcified plaque in proximal LCX causes minimal (0-24%) stenosis   Other findings:   Left Ventricle: Normal size   Left Atrium: Normal size   Pulmonary Veins: Normal configuration   Right Ventricle: Normal size   Right Atrium: Normal size   Cardiac valves: No calcifications   Thoracic aorta: Normal  size   Pulmonary Arteries: Normal size   Systemic Veins: Normal drainage   Pericardium: Normal thickness   IMPRESSION: 1.  Coronary calcium score of 0.   2.  Normal coronary origin with right dominance.   3.  Nonobstructive CAD   4. Noncalcified plaque in left main, proximal LAD, and proximal LCX causes minimal (0-24%) stenosis   CAD-RADS 1. Minimal non-obstructive CAD (0-24%). Consider non-atherosclerotic causes of chest pain. Consider preventive therapy and risk factor modification.     Electronically Signed   By: Oswaldo Milian M.D.   On: 08/20/2021 22:07  Recent Labs: 07/31/2021: B Natriuretic Peptide 43.4; TSH 2.930 08/01/2021: ALT 32 08/10/2021: Magnesium 2.4 09/21/2021: BUN 20; Creatinine, Ser 0.50; Hemoglobin 13.8; Platelets 303; Potassium 3.9; Sodium 139  Recent Lipid Panel    Component Value Date/Time   CHOL 188 08/01/2021 0444   TRIG 73 08/01/2021 0444   HDL 69 08/01/2021 0444   CHOLHDL 2.7 08/01/2021 0444   VLDL 15 08/01/2021 0444   LDLCALC 104 (H) 08/01/2021 0444   LDLDIRECT 97.0 06/06/2018 0812    Physical Exam:    VS:  BP (!) 140/86   Pulse 76   Ht '5\' 4"'$  (1.626 m)   Wt 167 lb 3.2 oz (75.8 kg)   SpO2 97%   BMI 28.70 kg/m     Wt Readings from Last 3 Encounters:  04/05/22 167 lb 3.2 oz (75.8 kg)  09/29/21 167 lb 1.7 oz (75.8 kg)  09/21/21 167 lb (75.8 kg)     GEN: Well nourished, well developed in no acute distress HEENT: Normal NECK: No JVD; No carotid bruits LYMPHATICS: No lymphadenopathy CARDIAC: S1S2 noted,RRR, no murmurs, rubs, gallops RESPIRATORY:  Clear to auscultation without rales, wheezing or rhonchi  ABDOMEN: Soft, non-tender, non-distended, +bowel sounds, no guarding. EXTREMITIES: No edema, No cyanosis, no clubbing MUSCULOSKELETAL:  No deformity  SKIN: Warm and dry NEUROLOGIC:  Alert and oriented x 3, non-focal PSYCHIATRIC:  Normal affect, good insight  ASSESSMENT:    1. Pure hypercholesterolemia   2. Minimal CAD    3. Pre-diabetes   4. Elevated blood pressure reading    PLAN:    Minimal coronary artery disease-she is on aspirin 81 mg daily.  She has had multiple issues with statin she had significant rash on atorvastatin and had not been willing to try any further statin.  Zetia gave her a  bad reaction as well.  I think it would be for sure to consider PCSK9 inhibitors in this situation.  I will get LP(a) today.  I also would refer the patient to our lipid clinic to discuss other lipid-lowering agent including PCSK9 inhibitors and bempedoic acid.  Will get blood work today which would include LP(a) as well as hemoglobin A1c.  Blood pressure slightly elevated in the office this is an isolated reading.  Discussed cutting back on increasing salt.  If this continues plan will be to increase the hypertensive medication.  The patient is in agreement with the above plan. The patient left the office in stable condition.  The patient will follow up in   Medication Adjustments/Labs and Tests Ordered: Current medicines are reviewed at length with the patient today.  Concerns regarding medicines are outlined above.  Orders Placed This Encounter  Procedures   HgB A1c   Lipoprotein A (LPA)   AMB Referral to [facility]   No orders of the defined types were placed in this encounter.   Patient Instructions  Medication Instructions:  Your physician recommends that you continue on your current medications as directed. Please refer to the Current Medication list given to you today.  *If you need a refill on your cardiac medications before your next appointment, please call your pharmacy*   Lab Work: TODAY: Lp(a)  If you have labs (blood work) drawn today and your tests are completely normal, you will receive your results only by: [place] (if you have MyChart) OR A paper copy in the mail If you have any lab test that is abnormal or we need to change your treatment, we will call you to review the  results.   Testing/Procedures: None   Follow-Up: At [facility], you and your health needs are our priority.  As part of our continuing mission to provide you with exceptional heart care, we have created designated Provider Care Teams.  These Care Teams include your primary Cardiologist (physician) and Advanced Practice Providers (APPs -  Physician Assistants and Nurse Practitioners) who all work together to provide you with the care you need, when you need it.  We recommend signing up for the patient portal called "MyChart".  Sign up information is provided on this After Visit Summary.  MyChart is used to connect with patients for Virtual Visits (Telemedicine).  Patients are able to view lab/test results, encounter notes, upcoming appointments, etc.  Non-urgent messages can be sent to your provider as well.   To learn more about what you can do with MyChart, go to [email or web address].    Your next appointment:   1 year(s)  The format for your next appointment:   In Person  Provider:   Berniece Salines, DO     Other Instructions   Important Information About Sugar         Adopting a Healthy Lifestyle.  Know what a healthy weight is for you (roughly BMI <25) and aim to maintain this   Aim for 7+ servings of fruits and vegetables daily   65-80+ fluid ounces of water or unsweet tea for healthy kidneys   Limit to max 1 drink of alcohol per day; avoid smoking/tobacco   Limit animal fats in diet for cholesterol and heart health - choose grass fed whenever available   Avoid highly processed foods, and foods high in saturated/trans fats   Aim for low stress - take time to unwind and care for your mental health   Aim for  150 min of moderate intensity exercise weekly for heart health, and weights twice weekly for bone health   Aim for 7-9 hours of sleep daily   When it comes to diets, agreement about the perfect plan isnt easy to find, even among the experts. Experts at  the [place] developed an idea known as the Healthy Eating Plate. Just imagine a plate divided into logical, healthy portions.   The emphasis is on diet quality:   Load up on vegetables and fruits - one-half of your plate: Aim for color and variety, and remember that potatoes dont count.   Go for whole grains - one-quarter of your plate: Whole wheat, barley, wheat berries, quinoa, oats, brown rice, and foods made with them. If you want pasta, go with whole wheat pasta.   Protein power - one-quarter of your plate: Fish, chicken, beans, and nuts are all healthy, versatile protein sources. Limit red meat.   The diet, however, does go beyond the plate, offering a few other suggestions.   Use healthy plant oils, such as olive, canola, soy, corn, sunflower and peanut. Check the labels, and avoid partially hydrogenated oil, which have unhealthy trans fats.   If youre thirsty, drink water. Coffee and tea are good in moderation, but skip sugary drinks and limit milk and dairy products to one or two daily servings.   The type of carbohydrate in the diet is more important than the amount. Some sources of carbohydrates, such as vegetables, fruits, whole grains, and beans-are healthier than others.   Finally, stay active  Signed, Berniece Salines, DO  04/05/2022 1:27 PM    [facility]

## 2022-04-05 NOTE — Patient Instructions (Signed)
Medication Instructions:  Your physician recommends that you continue on your current medications as directed. Please refer to the Current Medication list given to you today.  *If you need a refill on your cardiac medications before your next appointment, please call your pharmacy*   Lab Work: TODAY: Lp(a)  If you have labs (blood work) drawn today and your tests are completely normal, you will receive your results only by: [place] (if you have MyChart) OR A paper copy in the mail If you have any lab test that is abnormal or we need to change your treatment, we will call you to review the results.   Testing/Procedures: None   Follow-Up: At [facility], you and your health needs are our priority.  As part of our continuing mission to provide you with exceptional heart care, we have created designated Provider Care Teams.  These Care Teams include your primary Cardiologist (physician) and Advanced Practice Providers (APPs -  Physician Assistants and Nurse Practitioners) who all work together to provide you with the care you need, when you need it.  We recommend signing up for the patient portal called "MyChart".  Sign up information is provided on this After Visit Summary.  MyChart is used to connect with patients for Virtual Visits (Telemedicine).  Patients are able to view lab/test results, encounter notes, upcoming appointments, etc.  Non-urgent messages can be sent to your provider as well.   To learn more about what you can do with MyChart, go to [email or web address].    Your next appointment:   1 year(s)  The format for your next appointment:   In Person  Provider:   Berniece Salines, DO     Other Instructions   Important Information About Sugar

## 2022-04-06 LAB — HEMOGLOBIN A1C
Est. average glucose Bld gHb Est-mCnc: 128 mg/dL
Hgb A1c MFr Bld: 6.1 % — ABNORMAL HIGH (ref 4.8–5.6)

## 2022-04-06 LAB — LIPOPROTEIN A (LPA): Lipoprotein (a): 18.3 nmol/L (ref <75.0)

## 2022-04-07 ENCOUNTER — Ambulatory Visit (INDEPENDENT_AMBULATORY_CARE_PROVIDER_SITE_OTHER): Payer: Medicare Other | Admitting: Family Medicine

## 2022-04-07 ENCOUNTER — Telehealth: Payer: Self-pay | Admitting: Family Medicine

## 2022-04-07 ENCOUNTER — Encounter: Payer: Self-pay | Admitting: Family Medicine

## 2022-04-07 VITALS — Temp 98.6°F | Ht 64.0 in | Wt 167.3 lb

## 2022-04-07 DIAGNOSIS — J069 Acute upper respiratory infection, unspecified: Secondary | ICD-10-CM | POA: Diagnosis not present

## 2022-04-07 MED ORDER — BENZONATATE 200 MG PO CAPS: 200.0000 mg | ORAL_CAPSULE | Freq: Three times a day (TID) | ORAL | 1 refills | Status: DC | PRN

## 2022-04-07 NOTE — Telephone Encounter (Signed)
Called and spoke with patient , she said that she had been coughing with headaches since Saturday , she said that it not getting any better, made an appt for patient to be since today 12:30pm

## 2022-04-07 NOTE — Assessment & Plan Note (Signed)
Negative home covid test times 3  No sob or wheezing  In first 3-5 days  Discussed symptom control-see AVS Fluids/rest/ expectorant with DM prn  Sent in tessalon for cough Watch for s/s of bronchitis or bacterial infx  ER precautions rev  Update if not starting to improve in a week or if worsening

## 2022-04-07 NOTE — Telephone Encounter (Signed)
Patient called in and stated she had a cold and started coughing on Monday. Stated that the cough has gotten worse. She took 2 covid test and both are negative. Patient was wondering if Benzonatate could be sent in for the coughing. Stated the prescription can be sent over to [id number] [place], [place] Thank you!

## 2022-04-07 NOTE — Patient Instructions (Addendum)
Drink fluids and rest  mucinex DM is good for cough and congestion  Nasal saline for congestion as needed (nasal steroid spray also)  Tylenol for fever or pain or headache  Please alert [place] if symptoms worsen (if severe or short of breath please go to the ER)   Take the tessalon pearles for cough   Update if not starting to improve in a week or if worsening

## 2022-04-07 NOTE — Progress Notes (Signed)
Virtual Visit via Video Note  I connected with [name] on 04/07/22 at 12:30 PM EDT by a video enabled telemedicine application and verified that I am speaking with the correct person using two identifiers.  Location: Patient: home Provider: office    I discussed the limitations of evaluation and management by telemedicine and the availability of in person appointments. The patient expressed understanding and agreed to proceed.  Parties involved in encounter  Patient: [name]   Provider:  Loura Pardon MD   History of Present Illness: Pt presents with cough   Started on sat with cold symptoms  Took 3 covid tests LL are negative  No fever-no chills   Nasal symptoms- runny nose  Upper back pain  Cough is dry - hacking and frequent   Throat is scratchy- better now  Ears were achy-now better / perhaps stuffy   Headaches : around her R eye  Some nasal congestion  and runny nose  Clear mucous   No wheezing  No sob  Not hoarse   02 IS 97%   Otc  Allergy nasal spray  Some tylenol for pain  Sudafed - few doses  Cough drops  Used some old tessalon pearles-they help    Patient Active Problem List   Diagnosis Date Noted   Minimal CAD 04/05/2022   Pre-diabetes 04/05/2022   Pain of left heel 09/29/2021   Fatty liver 09/11/2021   Right upper quadrant abdominal pain 09/11/2021   Insomnia 09/11/2021   Hyperlipidemia 08/06/2021   Vaccine reaction 08/06/2021   Acute left-sided weakness 08/01/2021   GAD (generalized anxiety disorder) 08/01/2021   Current use of proton pump inhibitor 07/20/2021   Rash 07/20/2021   Colon cancer screening 07/20/2021   Hot flashes 07/11/2020   Subclinical hypothyroidism 07/11/2020   Allergy to statin medication 07/11/2020   Medicare annual wellness visit, subsequent 07/10/2019   Paresthesia 04/06/2019   Foot pain, bilateral 04/06/2019   Vaginal atrophy 06/09/2018   Breast cancer screening 05/31/2017   Prediabetes 06/02/2016    Estrogen deficiency 01/29/2015   Viral URI with cough 09/29/2012   Routine general medical examination at a health care facility 02/20/2012   GERD 06/20/2008   Past Medical History:  Diagnosis Date   Anemia    Anxiety    BBB (bundle branch block)    Complication of anesthesia    Depression    Dyspnea    Fatty liver    Fibrocystic disease of both breasts    GERD (gastroesophageal reflux disease)    Hyperglycemia    PONV (postoperative nausea and vomiting)    Pre-diabetes    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   On continue HRT since 1980 per patient   Sphincter of Oddi dysfunction    Past Surgical History:  Procedure Laterality Date   ABDOMINAL HYSTERECTOMY     CARPAL TUNNEL RELEASE  08/2008   left   CHOLECYSTECTOMY     ERCP     HEEL SPUR RESECTION Left 09/29/2021   Procedure: LEFT HEEL SPUR EXCISION AND ACHILLES TENDON REPAIR;  Surgeon: Melrose Nakayama, MD;  Location: [facility];  Service: Orthopedics;  Laterality: Left;   LEFT HEART CATHETERIZATION WITH CORONARY ANGIOGRAM N/A 06/08/2013   Procedure: LEFT HEART CATHETERIZATION WITH CORONARY ANGIOGRAM;  Surgeon: Ramond Dial, MD;  Location[facility] C[facility];  Service: Cardiovascular;  Laterality: N/A;   MASTECTOMY     bilateral for severe macrocystic breast   SPINE SURGERY N/A 02/12/2020   neck  TUBAL LIGATION     Social History   Tobacco Use   Smoking status: Never   Smokeless tobacco: Never  Vaping Use   Vaping Use: Never used  Substance Use Topics   Alcohol use: Yes    Comment: once every 2 weeks   Drug use: No   Family History  Problem Relation Age of Onset   Brain cancer Mother        hospice   Diabetes Father    Hypertension Father    Cancer Father        prostate and leukemia   Breast cancer Sister 15   Cancer Sister    Cancer Maternal Aunt        Pancreatic   Diabetes Maternal Uncle    Heart disease Maternal Grandmother    Allergies  Allergen Reactions   Atorvastatin Other (See  Comments)    REACTION: rash   Ezetimibe Other (See Comments)    REACTION: muscle aches   Polyoxyethylene 40 Sorbitol Septaoleate [Sorbitan] Other (See Comments)   Prednisone Other (See Comments)    **INJECTIONS ONLY** caused tachycardia    Shingrix [Zoster Vac Recomb Adjuvanted]     Reaction ?  Weakness on one side    Sulfamethoxazole-Trimethoprim Other (See Comments)    Swelling of nostrils   Current Outpatient Medications on File Prior to Visit  Medication Sig Dispense Refill   acetaminophen (TYLENOL) 500 MG tablet Take 1,000 mg by mouth at bedtime.     ALPRAZolam (XANAX) 1 MG tablet TAKE 1/2 TABLET BY MOUTH AT BEDTIME AS NEEDED FOR ANXIETY OR SLEEP 15 tablet 3   ASPERCREME LIDOCAINE EX Apply 1 application topically at bedtime as needed (foot pain).     aspirin EC 81 MG tablet Take 81 mg by mouth daily. Swallow whole.     Calcium-Magnesium-Vitamin D (CALCIUM 1200+D3 PO) Take 1 tablet by mouth at bedtime.     diclofenac Sodium (VOLTAREN) 1 % GEL APPLY 4 GRAMS TO AFFECTED AREA 4 TIMES DAILY (Patient taking differently: Apply 4 g topically 4 (four) times daily as needed (foot pain).) 200 g 11   Melatonin 10 MG CAPS Take 10 mg by mouth at bedtime.     methocarbamol (ROBAXIN) 750 MG tablet Take 1 tablet (750 mg total) by mouth every 6 (six) hours as needed for muscle spasms (& pain). 30 tablet 0   metoprolol succinate (TOPROL-XL) 25 MG 24 hr tablet Take 0.5 tablets (12.5 mg total) by mouth daily. 45 tablet 3   Multiple Vitamins-Minerals (EYE VITAMINS PO) Take 1 capsule by mouth daily.     Omega-3 Fatty Acids (OMEGA-3 FISH OIL PO) Take 1,000 mg by mouth daily.     pantoprazole (PROTONIX) 40 MG tablet Take 1 tablet (40 mg total) by mouth 2 (two) times daily. 180 tablet 3   triamcinolone cream (KENALOG) 0.1 % APPLY 1 APPLICATION TOPICALLY 2 (TWO) TIMES DAILY AS NEEDED. RASH 30 g 3   No current facility-administered medications on file prior to visit.   Review of Systems  Constitutional:   Negative for chills, fever and malaise/fatigue.  HENT:  Positive for congestion. Negative for ear pain, sinus pain and sore throat.   Eyes:  Negative for blurred vision, discharge and redness.  Respiratory:  Positive for cough. Negative for sputum production, shortness of breath, wheezing and stridor.   Cardiovascular:  Negative for chest pain, palpitations and leg swelling.  Gastrointestinal:  Negative for abdominal pain, diarrhea, nausea and vomiting.  Musculoskeletal:  Negative for myalgias.  Skin:  Negative for rash.  Neurological:  Positive for headaches. Negative for dizziness.    Observations/Objective: Patient appears well, in no distress Weight is baseline  No facial swelling or asymmetry Normal voice-not hoarse and no slurred speech No obvious tremor or mobility impairment Moving neck and UEs normally Able to hear the call well  No wheeze or shortness of breath during interview , cough sounds dry and hacking and pt clears throat occasionally Talkative and mentally sharp with no cognitive changes No skin changes on face or neck , no rash or pallor Affect is normal    Assessment and Plan: Problem List Items Addressed This Visit       Respiratory   Viral URI with cough - Primary    Negative home covid test times 3  No sob or wheezing  In first 3-5 days  Discussed symptom control-see AVS Fluids/rest/ expectorant with DM prn  Sent in tessalon for cough Watch for s/s of bronchitis or bacterial infx  ER precautions rev  Update if not starting to improve in a week or if worsening           Follow Up Instructions: Drink fluids and rest  mucinex DM is good for cough and congestion  Nasal saline for congestion as needed (nasal steroid spray also)  Tylenol for fever or pain or headache  Please alert [place] if symptoms worsen (if severe or short of breath please go to the ER)   Take the tessalon pearles for cough   Update if not starting to improve in a week or if  worsening    I discussed the assessment and treatment plan with the patient. The patient was provided an opportunity to ask questions and all were answered. The patient agreed with the plan and demonstrated an understanding of the instructions.   The patient was advised to call back or seek an in-person evaluation if the symptoms worsen or if the condition fails to improve as anticipated.     Loura Pardon, MD

## 2022-04-18 ENCOUNTER — Other Ambulatory Visit: Payer: Self-pay | Admitting: Family Medicine

## 2022-04-26 ENCOUNTER — Encounter: Payer: Self-pay | Admitting: Pharmacist Clinician (PhC)/ Clinical Pharmacy Specialist

## 2022-04-26 ENCOUNTER — Ambulatory Visit: Payer: Medicare Other | Attending: Cardiology | Admitting: Pharmacist Clinician (PhC)/ Clinical Pharmacy Specialist

## 2022-04-26 DIAGNOSIS — E7800 Pure hypercholesterolemia, unspecified: Secondary | ICD-10-CM

## 2022-04-26 MED ORDER — ROSUVASTATIN CALCIUM 5 MG PO TABS: 5.0000 mg | ORAL_TABLET | Freq: Every day | ORAL | 3 refills | Status: DC

## 2022-04-26 NOTE — Progress Notes (Signed)
04/26/2022 [name] Nov 01, 1950 [id number]   HPI:  [name] is a 71 y.o. female patient of Dr Harriet Masson, who presents today for a lipid clinic evaluation.  See pertinent past medical history below.  Patient was hospitalized in December after developing left sided weakness that lasted for several days.  Symptoms appeared 5-6 hours after getting shingles vaccine, and patient believes this was the cause.  While the hospitalist tried to explain that the likelihood of this is small, patient states she will not get booster dose and it is listed on her allergies.    Past Medical History: TIA Left hemiparesis - December 2022; had decrease sensation on left side  hypertension Controlled   Pre-diabetes A1c 6.1 (tends to hover at 6.1-6.4)   Current Medications: none  Cholesterol Goals: LDL < 70   Intolerant/previously tried: atorvastatin - rash; ezetimibe - myalgias  Family history: mgm had MI and stroke, CHF, mother had hypertension, DM2; father DM2, prostate cancer; 3 sisters - 1 sister with BBB, 1 breast cancer; 2 kids, son with hld  Diet: only whole grain breads; ice cream twice weekly; rarely eats red meat; mostly chicken and salmon; fresh vegetables from garden, does canning/freezing;  Exercise:  does 10-15K steps working in yard/in house;   Labs: 08/01/21: TC 188, TG73, HDL 69, LDL 104   Current Outpatient Medications  Medication Sig Dispense Refill   rosuvastatin (CRESTOR) 5 MG tablet Take 1 tablet (5 mg total) by mouth daily. 30 tablet 3   acetaminophen (TYLENOL) 500 MG tablet Take 1,000 mg by mouth at bedtime.     ALPRAZolam (XANAX) 1 MG tablet TAKE 1/2 TABLET BY MOUTH AT BEDTIME AS NEEDED FOR ANXIETY OR SLEEP 15 tablet 3   ASPERCREME LIDOCAINE EX Apply 1 application topically at bedtime as needed (foot pain).     aspirin EC 81 MG tablet Take 81 mg by mouth daily. Swallow whole.     benzonatate (TESSALON) 200 MG capsule Take 1 capsule (200 mg total) by mouth 3 (three)  times daily as needed for cough. Swallow whole, do not bite pill 30 capsule 1   Calcium-Magnesium-Vitamin D (CALCIUM 1200+D3 PO) Take 1 tablet by mouth at bedtime.     diclofenac Sodium (VOLTAREN) 1 % GEL APPLY 4 GRAMS TO AFFECTED AREA 4 TIMES DAILY (Patient taking differently: Apply 4 g topically 4 (four) times daily as needed (foot pain).) 200 g 11   Melatonin 10 MG CAPS Take 10 mg by mouth at bedtime.     methocarbamol (ROBAXIN) 750 MG tablet Take 1 tablet (750 mg total) by mouth every 6 (six) hours as needed for muscle spasms (& pain). 30 tablet 0   metoprolol succinate (TOPROL-XL) 25 MG 24 hr tablet Take 0.5 tablets (12.5 mg total) by mouth daily. 45 tablet 3   Multiple Vitamins-Minerals (EYE VITAMINS PO) Take 1 capsule by mouth daily.     Omega-3 Fatty Acids (OMEGA-3 FISH OIL PO) Take 1,000 mg by mouth daily.     pantoprazole (PROTONIX) 40 MG tablet TAKE 1 TABLET BY MOUTH TWICE  DAILY 200 tablet 2   triamcinolone cream (KENALOG) 0.1 % APPLY 1 APPLICATION TOPICALLY 2 (TWO) TIMES DAILY AS NEEDED. RASH 30 g 3   No current facility-administered medications for this visit.    Allergies  Allergen Reactions   Atorvastatin Other (See Comments)    REACTION: rash   Ezetimibe Other (See Comments)    REACTION: muscle aches   Polyoxyethylene 40 Sorbitol Septaoleate [Sorbitan] Other (See Comments)  Prednisone Other (See Comments)    **INJECTIONS ONLY** caused tachycardia    Shingrix [Zoster Vac Recomb Adjuvanted]     Reaction ?  Weakness on one side    Sulfamethoxazole-Trimethoprim Other (See Comments)    Swelling of nostrils    Past Medical History:  Diagnosis Date   Anemia    Anxiety    BBB (bundle branch block)    Complication of anesthesia    Depression    Dyspnea    Fatty liver    Fibrocystic disease of both breasts    GERD (gastroesophageal reflux disease)    Hyperglycemia    PONV (postoperative nausea and vomiting)    Pre-diabetes    S/P TAH (total abdominal hysterectomy)  08/30/1978   On continue HRT since 1980 per patient   Sphincter of Oddi dysfunction     There were no vitals taken for this visit.   Hyperlipidemia Patient with prior TIA and hyperlipidemia, LDL not at goal of < 70.  Reviewed options for lowering LDL cholesterol, including ezetimibe, PCSK-9 inhibitors, bempedoic acid and inclisiran.  Discussed mechanisms of action, dosing, side effects and potential decreases in LDL cholesterol.  Also reviewed cost information and potential options for patient assistance.  Answered all patient questions.  Because she has only ever tried one statin drug, we will start with rosuvastatin 5 mg.  She can start with 1 tab three times weekly for the first week or two, and if no sign of rash, she will increase to daily use.  If she develops rash, she is to let us know and we can then start the process to get Repatha covered by her insurance.     Tommy Medal PharmD CPP [facility] [address] [place], [place] [address] [phone]

## 2022-04-26 NOTE — Assessment & Plan Note (Signed)
Patient with prior TIA and hyperlipidemia, LDL not at goal of < 70.  Reviewed options for lowering LDL cholesterol, including ezetimibe, PCSK-9 inhibitors, bempedoic acid and inclisiran.  Discussed mechanisms of action, dosing, side effects and potential decreases in LDL cholesterol.  Also reviewed cost information and potential options for patient assistance.  Answered all patient questions.  Because she has only ever tried one statin drug, we will start with rosuvastatin 5 mg.  She can start with 1 tab three times weekly for the first week or two, and if no sign of rash, she will increase to daily use.  If she develops rash, she is to let us know and we can then start the process to get Repatha covered by her insurance.

## 2022-04-26 NOTE — Patient Instructions (Signed)
Your Results:             Your most recent labs Goal  Total Cholesterol 188 < 200  Triglycerides 73 < 150  HDL (happy/good cholesterol) 69 > 40  LDL (lousy/bad cholesterol 104 < 70   Medication changes:  Start rosuvastatin 5 mg three times a week for about 2 weeks.  If you tolerate this without issue then increase the dose to once daily.   If it doesn't work for you, please send me a MyChart message or call [phone].    Lab orders:  We want to repeat labs after 2-3 months.  We will send you a lab order to remind you once we get closer to that time.    Patient Assistance:  The [employer] foundation offers assistance to help pay for medication copays.  They will cover copays for all cholesterol lowering meds, including statins, fibrates, omega-3 oils, ezetimibe, Repatha, Praluent, Nexletol, Nexlizet.  The cards are usually good for $2,500 or 12 months, whichever comes first. Go to healthwellfoundation.org Click on "Apply Now" Answer questions as to whom is applying (patient or representative) Your disease fund will be "hypercholesterolemia - Medicare access" They will ask questions about finances and which medications you are taking for cholesterol When you submit, the approval is usually within minutes.  You will need to print the card information from the site You will need to show this information to your pharmacy, they will bill your Medicare Part D plan first -then bill [employer] --for the copay.   You can also call them at [phone], although the hold times can be quite long.   Thank you for choosing [facility]

## 2022-05-26 ENCOUNTER — Other Ambulatory Visit: Payer: Self-pay | Admitting: Family Medicine

## 2022-05-27 NOTE — Telephone Encounter (Signed)
Last filled on 05/18/21 #200 g with 11 refills, last OV was an acute cough appt on 04/07/22, please advise

## 2022-06-12 ENCOUNTER — Encounter: Payer: Self-pay | Admitting: Pharmacist Clinician (PhC)/ Clinical Pharmacy Specialist

## 2022-06-12 ENCOUNTER — Telehealth: Payer: Self-pay | Admitting: Pharmacist Clinician (PhC)/ Clinical Pharmacy Specialist

## 2022-06-12 MED ORDER — REPATHA SURECLICK 140 MG/ML ~~LOC~~ SOAJ: 140.0000 mg | SUBCUTANEOUS | 12 refills | Status: DC

## 2022-06-12 NOTE — Telephone Encounter (Signed)
Repatha PA approved to 12/12/22  Key [id number]  Rx sent to pharmacy, patient notified via [facility]

## 2022-07-22 ENCOUNTER — Other Ambulatory Visit: Payer: Self-pay | Admitting: Cardiology

## 2022-07-23 ENCOUNTER — Other Ambulatory Visit: Payer: Self-pay | Admitting: Family Medicine

## 2022-07-26 ENCOUNTER — Ambulatory Visit (INDEPENDENT_AMBULATORY_CARE_PROVIDER_SITE_OTHER): Payer: Medicare Other | Admitting: Family Medicine

## 2022-07-26 ENCOUNTER — Encounter: Payer: Self-pay | Admitting: Family Medicine

## 2022-07-26 VITALS — BP 122/74 | HR 60 | Temp 98.6°F | Ht 64.0 in | Wt 172.0 lb

## 2022-07-26 DIAGNOSIS — E7800 Pure hypercholesterolemia, unspecified: Secondary | ICD-10-CM | POA: Diagnosis not present

## 2022-07-26 DIAGNOSIS — Z0000 Encounter for general adult medical examination without abnormal findings: Secondary | ICD-10-CM

## 2022-07-26 DIAGNOSIS — Z1211 Encounter for screening for malignant neoplasm of colon: Secondary | ICD-10-CM

## 2022-07-26 DIAGNOSIS — Z79899 Other long term (current) drug therapy: Secondary | ICD-10-CM | POA: Diagnosis not present

## 2022-07-26 DIAGNOSIS — E038 Other specified hypothyroidism: Secondary | ICD-10-CM

## 2022-07-26 DIAGNOSIS — I2510 Atherosclerotic heart disease of native coronary artery without angina pectoris: Secondary | ICD-10-CM | POA: Diagnosis not present

## 2022-07-26 DIAGNOSIS — R7303 Prediabetes: Secondary | ICD-10-CM

## 2022-07-26 DIAGNOSIS — K219 Gastro-esophageal reflux disease without esophagitis: Secondary | ICD-10-CM

## 2022-07-26 DIAGNOSIS — K760 Fatty (change of) liver, not elsewhere classified: Secondary | ICD-10-CM | POA: Diagnosis not present

## 2022-07-26 LAB — T4, FREE: Free T4: 0.74 ng/dL (ref 0.60–1.60)

## 2022-07-26 LAB — LIPID PANEL
Cholesterol: 153 mg/dL (ref 0–200)
HDL: 73.3 mg/dL (ref >39.00)
LDL Cholesterol: 50 mg/dL (ref 0–99)
NonHDL: 79.21
Total CHOL/HDL Ratio: 2
Triglycerides: 147 mg/dL (ref 0.0–149.0)
VLDL: 29.4 mg/dL (ref 0.0–40.0)

## 2022-07-26 LAB — COMPREHENSIVE METABOLIC PANEL
ALT: 40 U/L — ABNORMAL HIGH (ref 0–35)
AST: 29 U/L (ref 0–37)
Albumin: 4.7 g/dL (ref 3.5–5.2)
Alkaline Phosphatase: 74 U/L (ref 39–117)
BUN: 13 mg/dL (ref 6–23)
CO2: 28 mEq/L (ref 19–32)
Calcium: 10.1 mg/dL (ref 8.4–10.5)
Chloride: 100 mEq/L (ref 96–112)
Creatinine, Ser: 0.68 mg/dL (ref 0.40–1.20)
GFR: 86.74 mL/min (ref >60.00)
Glucose, Bld: 106 mg/dL — ABNORMAL HIGH (ref 70–99)
Potassium: 4.4 mEq/L (ref 3.5–5.1)
Sodium: 137 mEq/L (ref 135–145)
Total Bilirubin: 1 mg/dL (ref 0.2–1.2)
Total Protein: 7.5 g/dL (ref 6.0–8.3)

## 2022-07-26 LAB — VITAMIN B12: Vitamin B-12: 324 pg/mL (ref 211–911)

## 2022-07-26 LAB — HEMOGLOBIN A1C: Hgb A1c MFr Bld: 6.6 % — ABNORMAL HIGH (ref 4.6–6.5)

## 2022-07-26 LAB — TSH: TSH: 2.43 u[IU]/mL (ref 0.35–5.50)

## 2022-07-26 MED ORDER — ALPRAZOLAM 1 MG PO TABS: ORAL_TABLET | ORAL | 3 refills | Status: DC

## 2022-07-26 NOTE — Assessment & Plan Note (Signed)
A1c ordered disc imp of low glycemic diet and wt loss to prevent DM2  

## 2022-07-26 NOTE — Patient Instructions (Addendum)
Talk to your cardiologist about the repatha    Take care of yourself  Continue walking/running Add any strength training you can (exercise bands or light weights are great)   Labs today

## 2022-07-26 NOTE — Assessment & Plan Note (Signed)
Reviewed health habits including diet and exercise and skin cancer prevention Reviewed appropriate screening tests for age  Also reviewed health mt list, fam hx and immunization status , as well as social and family history   See HPI Labs reviewed  Mammogram utd 11/2021 Dexa normal 2019  Colonoscopy 2014 and neg cologuard 06/2021

## 2022-07-26 NOTE — Assessment & Plan Note (Signed)
Lipid panel ordered  Disc goals for lipids and reasons to control them Rev last labs with pt Rev low sat fat diet in detail Note CAD Taking repatha now from cardiology

## 2022-07-26 NOTE — Assessment & Plan Note (Signed)
Continues protonix  B12 level ordered

## 2022-07-26 NOTE — Assessment & Plan Note (Signed)
B-12 level ordered.

## 2022-07-26 NOTE — Assessment & Plan Note (Signed)
Continues cardiology care Taking repatha  Also metoprolol

## 2022-07-26 NOTE — Assessment & Plan Note (Signed)
No clinical changes TSH and FT4 ordered

## 2022-07-26 NOTE — Assessment & Plan Note (Signed)
Lab today  Occ some discomfort

## 2022-07-26 NOTE — Assessment & Plan Note (Signed)
Colonoscopy 2014 Nl cologuard 06/2021 good for 3 y

## 2022-07-26 NOTE — Progress Notes (Signed)
Subjective:    Patient ID: [name], female    DOB: [birth year], 71 y.o.   MRN: [id number]  HPI Here for health maintenance exam and to review chronic medical problems   Wt Readings from Last 3 Encounters:  07/26/22 172 lb (78 kg)  04/07/22 167 lb 4.8 oz (75.9 kg)  04/05/22 167 lb 3.2 oz (75.8 kg)   29.52 kg/m  Stress of the holidays makes her stomach hurt at times  Did not sleep last night at all  Xanax ran out  Has to take it every night  Has always had to take it  Good sleep hygiene    Some running and walking for exercise (in her house)    Immunization History  Administered Date(s) Administered   Fluad Quad(high Dose 65+) 06/18/2019, 06/12/2020   Influenza Whole 05/30/2008   Influenza, High Dose Seasonal PF 06/03/2021   Influenza,inj,Quad PF,6+ Mos 05/03/2014, 06/06/2015, 05/10/2016, 05/26/2017, 06/09/2018   Influenza-Unspecified 05/24/2022   Moderna SARS-COV2 Booster Vaccination 12/03/2020   PFIZER(Purple Top)SARS-COV-2 Vaccination 10/05/2019, 10/30/2019, 06/05/2020   Pfizer Covid-19 Vaccine Bivalent Booster 64yr & up 06/03/2021   Pneumococcal Conjugate-13 01/29/2015   Pneumococcal Polysaccharide-23 05/10/2016   Td 08/30/2002, 04/27/2019   Tdap 05/03/2014   Zoster Recombinat (Shingrix) 07/29/2021   Zoster, Live 09/07/2012   Health Maintenance Due  Topic Date Due   Diabetic kidney evaluation - Urine ACR  Never done   FOOT EXAM  07/11/2021   Medicare Annual Wellness (AWV)  07/16/2022   Mammogram  11/2021  Self breast exam: no lumps   Colon cancer screening 07/2013 colonoscopy  Cologuard 06/2021    Dexa  2019  in the normal range  Falls-none Fractures-none  Supplements ca and mag and D Exercise -walk /run  CAD minimal On beta blocker   BP Readings from Last 3 Encounters:  07/26/22 122/74  04/05/22 (!) 140/86  09/29/21 125/69   Pulse Readings from Last 3 Encounters:  07/26/22 60  04/05/22 76  09/29/21 75    On repatha  Lab Results   Component Value Date   CHOL 188 08/01/2021   HDL 69 08/01/2021   LDLCALC 104 (H) 08/01/2021   LDLDIRECT 97.0 06/06/2018   TRIG 73 08/01/2021   CHOLHDL 2.7 08/01/2021   Has some CAD (cardiac CT)   H/o fatty liver Unsure if this causes pain     Subclinical hypothyroidism Lab Results  Component Value Date   TSH 2.930 07/31/2021   GERD Protonix Lab Results  Component Value Date   VITAMINB12 380 07/20/2021    Prediabetes Lab Results  Component Value Date   HGBA1C 6.1 (H) 04/05/2022   Lab Results  Component Value Date   ALT 32 08/01/2021   AST 23 08/01/2021   ALKPHOS 57 08/01/2021   BILITOT 0.8 08/01/2021    Watches what she eats  Over summer a little more ice cream however  Is mindful  Sweets bother her stomach also   Patient Active Problem List   Diagnosis Date Noted   Minimal CAD 04/05/2022   Pain of left heel 09/29/2021   Fatty liver 09/11/2021   Right upper quadrant abdominal pain 09/11/2021   Insomnia 09/11/2021   Hyperlipidemia 08/06/2021   Vaccine reaction 08/06/2021   GAD (generalized anxiety disorder) 08/01/2021   Current use of proton pump inhibitor 07/20/2021   Colon cancer screening 07/20/2021   Hot flashes 07/11/2020   Subclinical hypothyroidism 07/11/2020   Allergy to statin medication 07/11/2020   Medicare annual wellness visit, subsequent 07/10/2019  Paresthesia 04/06/2019   Foot pain, bilateral 04/06/2019   Vaginal atrophy 06/09/2018   Breast cancer screening 05/31/2017   Prediabetes 06/02/2016   Estrogen deficiency 01/29/2015   Routine general medical examination at a health care facility 02/20/2012   GERD 06/20/2008   Past Medical History:  Diagnosis Date   Anemia    Anxiety    BBB (bundle branch block)    Complication of anesthesia    Depression    Dyspnea    Fatty liver    Fibrocystic disease of both breasts    GERD (gastroesophageal reflux disease)    Hyperglycemia    PONV (postoperative nausea and vomiting)     Pre-diabetes    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   On continue HRT since 1980 per patient   Sphincter of Oddi dysfunction    Past Surgical History:  Procedure Laterality Date   ABDOMINAL HYSTERECTOMY     CARPAL TUNNEL RELEASE  08/2008   left   CHOLECYSTECTOMY     ERCP     HEEL SPUR RESECTION Left 09/29/2021   Procedure: LEFT HEEL SPUR EXCISION AND ACHILLES TENDON REPAIR;  Surgeon: Melrose Nakayama, MD;  Location: [facility];  Service: Orthopedics;  Laterality: Left;   LEFT HEART CATHETERIZATION WITH CORONARY ANGIOGRAM N/A 06/08/2013   Procedure: LEFT HEART CATHETERIZATION WITH CORONARY ANGIOGRAM;  Surgeon: Ramond Dial, MD;  Location: [facility] CATH LAB;  Service: Cardiovascular;  Laterality: N/A;   MASTECTOMY     bilateral for severe macrocystic breast   SPINE SURGERY N/A 02/12/2020   neck   TUBAL LIGATION     Social History   Tobacco Use   Smoking status: Never   Smokeless tobacco: Never  Vaping Use   Vaping Use: Never used  Substance Use Topics   Alcohol use: Yes    Comment: once every 2 weeks   Drug use: No   Family History  Problem Relation Age of Onset   Brain cancer Mother        hospice   Diabetes Father    Hypertension Father    Cancer Father        prostate and leukemia   Breast cancer Sister 61   Cancer Sister    Cancer Maternal Aunt        Pancreatic   Diabetes Maternal Uncle    Heart disease Maternal Grandmother    Allergies  Allergen Reactions   Atorvastatin Other (See Comments)    REACTION: rash   Ezetimibe Other (See Comments)    REACTION: muscle aches   Polyoxyethylene 40 Sorbitol Septaoleate [Sorbitan] Other (See Comments)   Prednisone Other (See Comments)    **INJECTIONS ONLY** caused tachycardia    Rosuvastatin Other (See Comments)    Fatigue and weakness   Shingrix [Zoster Vac Recomb Adjuvanted]     Reaction ?  Weakness on one side    Sulfamethoxazole-Trimethoprim Other (See Comments)    Swelling of nostrils   Current  Outpatient Medications on File Prior to Visit  Medication Sig Dispense Refill   acetaminophen (TYLENOL) 500 MG tablet Take 1,000 mg by mouth at bedtime.     ASPERCREME LIDOCAINE EX Apply 1 application topically at bedtime as needed (foot pain).     aspirin EC 81 MG tablet Take 81 mg by mouth daily. Swallow whole.     benzonatate (TESSALON) 200 MG capsule Take 1 capsule (200 mg total) by mouth 3 (three) times daily as needed for cough. Swallow whole, do not bite pill 30 capsule  1   Calcium-Magnesium-Vitamin D (CALCIUM 1200+D3 PO) Take 1 tablet by mouth at bedtime.     diclofenac Sodium (VOLTAREN) 1 % GEL APPLY 4 GRAMS TO AFFECTED AREA 4 TIMES DAILY AS NEEDED 200 g 11   Evolocumab (REPATHA SURECLICK) 211 MG/ML SOAJ Inject 140 mg into the skin every 14 (fourteen) days. 2 mL 12   Melatonin 10 MG CAPS Take 10 mg by mouth at bedtime.     metoprolol succinate (TOPROL-XL) 25 MG 24 hr tablet Take 0.5 tablets (12.5 mg total) by mouth daily. 45 tablet 3   Multiple Vitamins-Minerals (EYE VITAMINS PO) Take 1 capsule by mouth daily.     pantoprazole (PROTONIX) 40 MG tablet TAKE 1 TABLET BY MOUTH TWICE  DAILY 200 tablet 2   triamcinolone cream (KENALOG) 0.1 % APPLY 1 APPLICATION TOPICALLY 2 (TWO) TIMES DAILY AS NEEDED. RASH 30 g 3   No current facility-administered medications on file prior to visit.    Review of Systems  Constitutional:  Negative for activity change, appetite change, fatigue, fever and unexpected weight change.  HENT:  Negative for congestion, ear pain, rhinorrhea, sinus pressure and sore throat.   Eyes:  Negative for pain, redness and visual disturbance.  Respiratory:  Negative for cough, shortness of breath and wheezing.   Cardiovascular:  Negative for chest pain and palpitations.  Gastrointestinal:  Negative for abdominal pain, blood in stool, constipation and diarrhea.       Occ R abd discomfort   Endocrine: Negative for polydipsia and polyuria.  Genitourinary:  Negative for dysuria,  frequency and urgency.  Musculoskeletal:  Negative for arthralgias, back pain and myalgias.  Skin:  Negative for pallor and rash.  Allergic/Immunologic: Negative for environmental allergies.  Neurological:  Negative for dizziness, syncope and headaches.  Hematological:  Negative for adenopathy. Does not bruise/bleed easily.  Psychiatric/Behavioral:  Positive for sleep disturbance. Negative for decreased concentration and dysphoric mood. The patient is not nervous/anxious.        Objective:   Physical Exam Constitutional:      General: She is not in acute distress.    Appearance: Normal appearance. She is well-developed. She is not ill-appearing or diaphoretic.     Comments: Overweight   HENT:     Head: Normocephalic and atraumatic.     Right Ear: Tympanic membrane, ear canal and external ear normal.     Left Ear: Tympanic membrane, ear canal and external ear normal.     Nose: Nose normal. No congestion.     Mouth/Throat:     Mouth: Mucous membranes are moist.     Pharynx: Oropharynx is clear. No posterior oropharyngeal erythema.  Eyes:     General: No scleral icterus.    Extraocular Movements: Extraocular movements intact.     Conjunctiva/sclera: Conjunctivae normal.     Pupils: Pupils are equal, round, and reactive to light.  Neck:     Thyroid: No thyromegaly.     Vascular: No carotid bruit or JVD.  Cardiovascular:     Rate and Rhythm: Normal rate and regular rhythm.     Pulses: Normal pulses.     Heart sounds: Normal heart sounds.     No gallop.  Pulmonary:     Effort: Pulmonary effort is normal. No respiratory distress.     Breath sounds: Normal breath sounds. No wheezing.     Comments: Good air exch Chest:     Chest wall: No tenderness.  Abdominal:     General: Bowel sounds are normal. There is  no distension or abdominal bruit.     Palpations: Abdomen is soft. There is no mass.     Tenderness: There is no abdominal tenderness.     Hernia: No hernia is present.   Genitourinary:    Comments: No changes noted with breast reconstruction (s/p mastectomy)  Musculoskeletal:        General: No tenderness. Normal range of motion.     Cervical back: Normal range of motion and neck supple. No rigidity. No muscular tenderness.     Right lower leg: No edema.     Left lower leg: No edema.     Comments: No kyphosis   Lymphadenopathy:     Cervical: No cervical adenopathy.  Skin:    General: Skin is warm and dry.     Coloration: Skin is not pale.     Findings: No erythema or rash.     Comments: Solar lentigines diffusely   Neurological:     Mental Status: She is alert. Mental status is at baseline.     Cranial Nerves: No cranial nerve deficit.     Motor: No abnormal muscle tone.     Coordination: Coordination normal.     Gait: Gait normal.     Deep Tendon Reflexes: Reflexes are normal and symmetric. Reflexes normal.  Psychiatric:        Mood and Affect: Mood normal.        Cognition and Memory: Cognition and memory normal.           Assessment & Plan:   Problem List Items Addressed This Visit       Cardiovascular and Mediastinum   Minimal CAD    Continues cardiology care Taking repatha  Also metoprolol         Digestive   Fatty liver    Lab today  Occ some discomfort       GERD    Continues protonix  B12 level ordered         Endocrine   Subclinical hypothyroidism    No clinical changes TSH and FT4 ordered       Relevant Orders   TSH (Completed)   T4, free (Completed)     Other   Colon cancer screening    Colonoscopy 2014 Nl cologuard 06/2021 good for 3 y      Current use of proton pump inhibitor    B12 level ordered       Relevant Orders   Vitamin B12 (Completed)   Hyperlipidemia    Lipid panel ordered  Disc goals for lipids and reasons to control them Rev last labs with pt Rev low sat fat diet in detail Note CAD Taking repatha now from cardiology      Relevant Orders   Lipid panel (Completed)    Comprehensive metabolic panel (Completed)   Prediabetes    A1c ordered  disc imp of low glycemic diet and wt loss to prevent DM2       Relevant Orders   Hemoglobin A1c (Completed)   Routine general medical examination at a health care facility - Primary    Reviewed health habits including diet and exercise and skin cancer prevention Reviewed appropriate screening tests for age  Also reviewed health mt list, fam hx and immunization status , as well as social and family history   See HPI Labs reviewed  Mammogram utd 11/2021 Dexa normal 2019  Colonoscopy 2014 and neg cologuard 06/2021

## 2022-08-12 IMAGING — CT CT HEART MORP W/ CTA COR W/ SCORE W/ CA W/CM &/OR W/O CM
4 of 7 series · 8 of 20 positions shown, 9 images · non-contrast
Comparison: None.
COMPARISON: None.

Addendum:
EXAM:
OVER-READ INTERPRETATION  CT CHEST

The following report is an over-read performed by radiologist Dr.
Mari Sol Gudiel [[email or web address]] on 08/20/2021. This
over-read does not include interpretation of cardiac or coronary
anatomy or pathology. The coronary CTA interpretation by the
cardiologist is attached.
CLINICAL DATA: 70F with chest pain
Cardiac/Coronary CTA
TECHNIQUE: The patient was scanned on a [employer] Force scanner.

[Series 6: best diast · axial · 0.39mm/px · z∈[-351,-315]mm · 2 of 268 slices shown, 3 images]
[im 90/268  vessel]
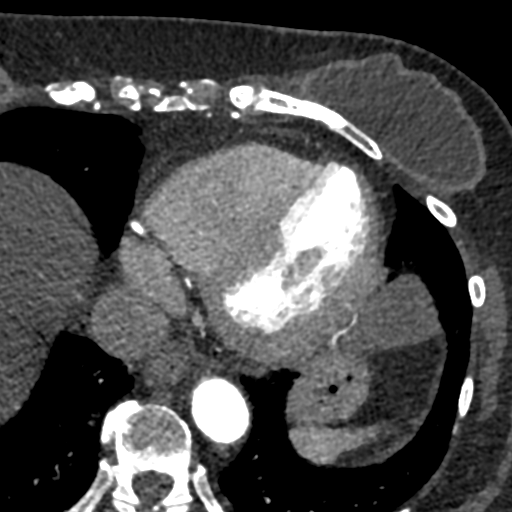
[im 90/268  lung]
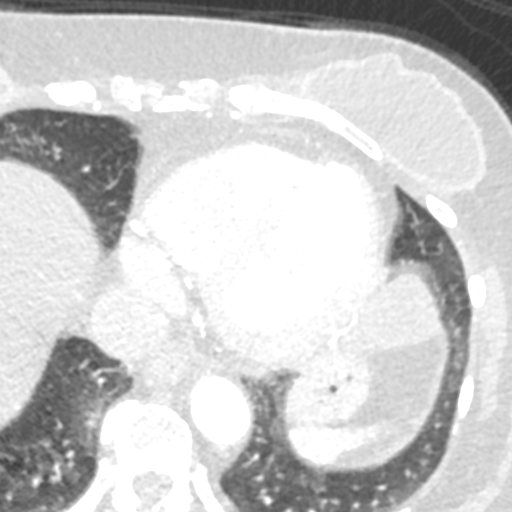
[im 179/268  vessel]
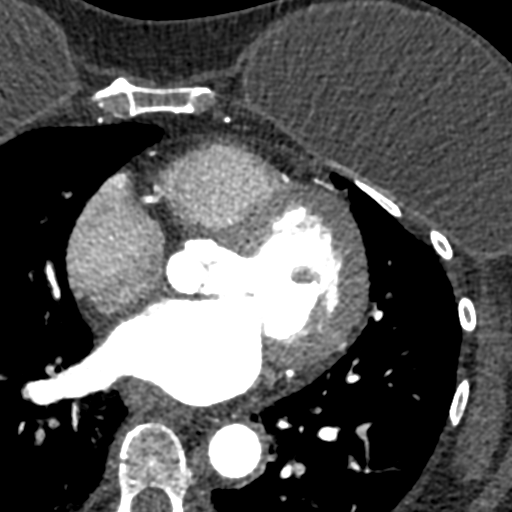

[Series 7: best syst · axial · 0.39mm/px · z∈[-351,-315]mm · 2 of 268 slices shown]
[im 90/268  vessel]
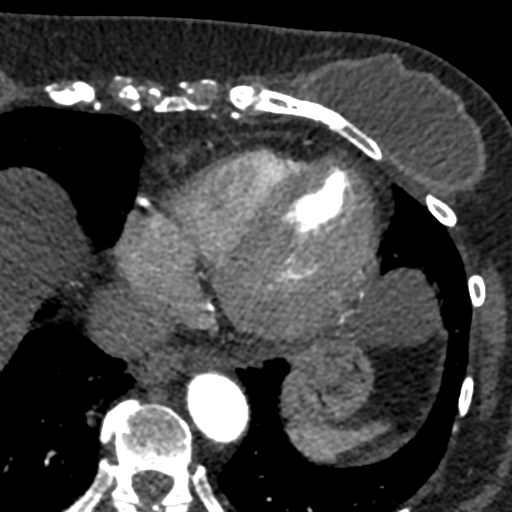
[im 179/268  vessel]
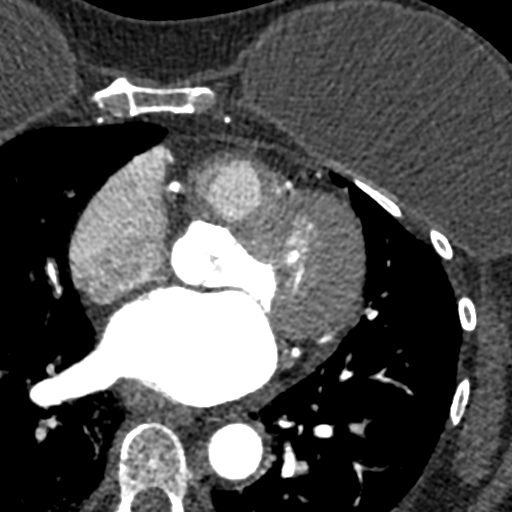

[Series 8: ts diast sharp · axial · 0.39mm/px · z∈[-351,-315]mm · 2 of 268 slices shown]
[im 90/268  lung]
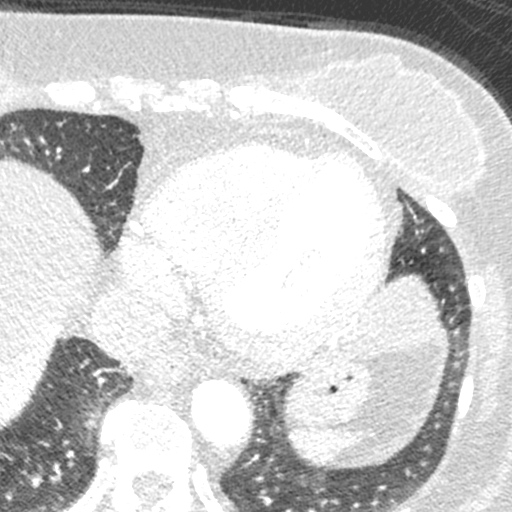
[im 179/268  lung]
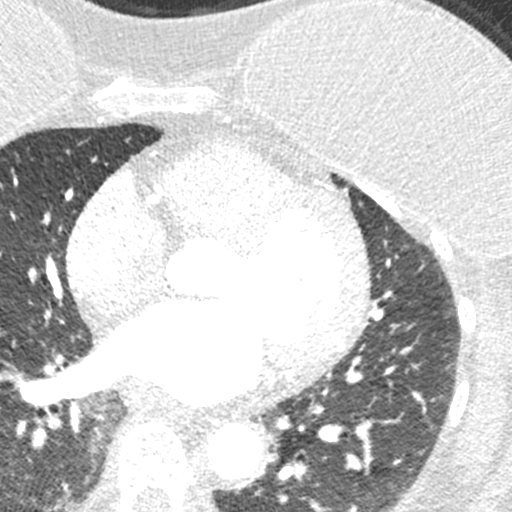

[Series 9: ts syst sharp · axial · 0.39mm/px · z∈[-351,-315]mm · 2 of 268 slices shown]
[im 90/268  lung]
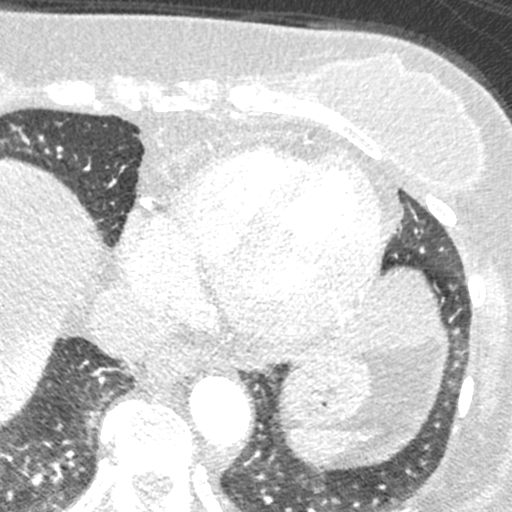
[im 179/268  lung]
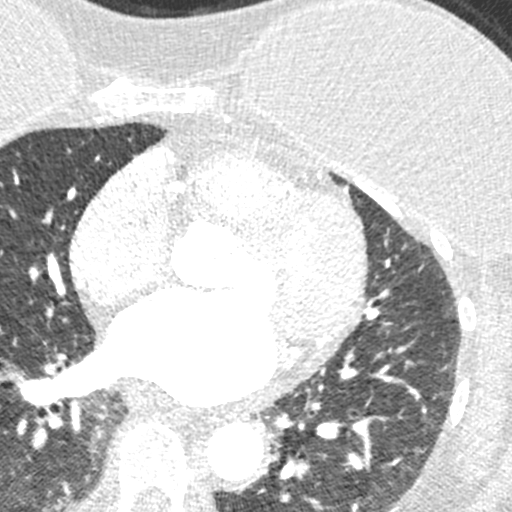

[8 of 20 positions shown; findings below may reference images not displayed]

FINDINGS: Vascular: No significant noncardiac vascular findings.

Mediastinum/Nodes: Visualized mediastinum and hilar regions
demonstrate no lymphadenopathy or masses.

Lungs/Pleura: Visualized lungs show no evidence of pulmonary edema,
consolidation, pneumothorax, nodule or pleural fluid.

Upper Abdomen: The visualized liver demonstrates evidence of
steatosis.

Musculoskeletal: No chest wall mass or suspicious bone lesions
identified.
IMPRESSION: Hepatic steatosis.
FINDINGS: A 100 kV prospective scan was triggered in the descending thoracic
aorta at 111 HU's. Axial non-contrast 3 mm slices were carried out
through the heart. The data set was analyzed on a dedicated work
station and scored using the Agatson method. Gantry rotation speed
was 250 msecs and collimation was .6 mm. No beta blockade and 0.8 mg
of sl NTG was given. The 3D data set was reconstructed in 5%
intervals of the 67-82 % of the R-R cycle. Diastolic phases were
analyzed on a dedicated work station using MPR, MIP and VRT modes.
The patient received 80 cc of contrast.

Coronary Arteries:  Normal coronary origin.  Right dominance.

RCA is a large dominant artery that gives rise to PDA and PLA. There
is no plaque.

Left main is a large artery that gives rise to LAD and LCX arteries.
Noncalcified plaque in left main causes minimal (0-24%) stenosis

LAD is a large vessel. Noncalcified plaque in proximal LAD causes
minimal (0-24%) stenosis

LCX is a non-dominant artery that gives rise to one large OM1
branch. Noncalcified plaque in proximal LCX causes minimal (0-24%)
stenosis

Other findings:

Left Ventricle: Normal size

Left Atrium: Normal size

Pulmonary Veins: Normal configuration

Right Ventricle: Normal size

Right Atrium: Normal size

Cardiac valves: No calcifications

Thoracic aorta: Normal size

Pulmonary Arteries: Normal size

Systemic Veins: Normal drainage

Pericardium: Normal thickness
IMPRESSION: 1.  Coronary calcium score of 0.

2.  Normal coronary origin with right dominance.

3.  Nonobstructive CAD

4. Noncalcified plaque in left main, proximal LAD, and proximal LCX
causes minimal (0-24%) stenosis

CAD-RADS 1. Minimal non-obstructive CAD (0-24%). Consider
non-atherosclerotic causes of chest pain. Consider preventive
therapy and risk factor modification.

*** End of Addendum ***
EXAM:
OVER-READ INTERPRETATION  CT CHEST

The following report is an over-read performed by radiologist Dr.
Mari Sol Gudiel [[email or web address]] on 08/20/2021. This
over-read does not include interpretation of cardiac or coronary
anatomy or pathology. The coronary CTA interpretation by the
cardiologist is attached.
FINDINGS: Vascular: No significant noncardiac vascular findings.

Mediastinum/Nodes: Visualized mediastinum and hilar regions
demonstrate no lymphadenopathy or masses.

Lungs/Pleura: Visualized lungs show no evidence of pulmonary edema,
consolidation, pneumothorax, nodule or pleural fluid.

Upper Abdomen: The visualized liver demonstrates evidence of
steatosis.

Musculoskeletal: No chest wall mass or suspicious bone lesions
identified.
IMPRESSION: Hepatic steatosis.

## 2022-09-10 ENCOUNTER — Other Ambulatory Visit: Payer: Self-pay | Admitting: Cardiology

## 2022-09-14 IMAGING — US US ABDOMEN LIMITED
1 series · 14 of 25 positions shown · non-contrast
Comparison: None.

CLINICAL DATA: Right upper quadrant discomfort

EXAM:
ULTRASOUND ABDOMEN LIMITED RIGHT UPPER QUADRANT

[Series 1: us abdomen limited · 0.27mm/px · 14 of 47 slices shown]
[im 1/47]
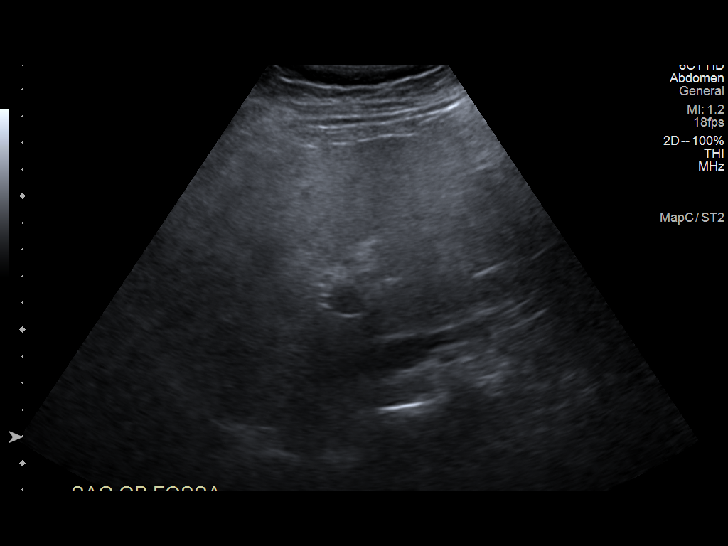
[im 4/47]
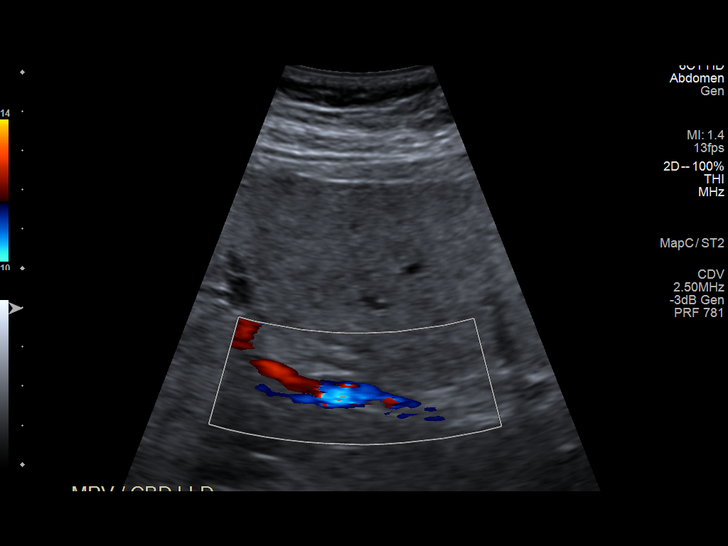
[im 8/47]
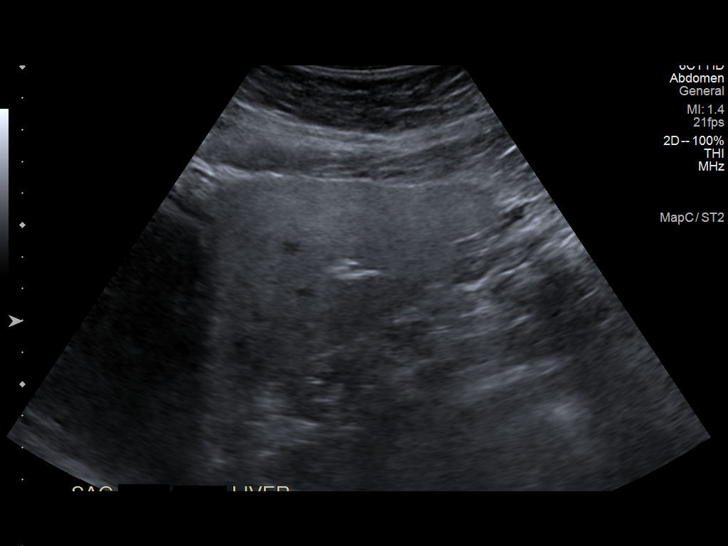
[im 12/47]
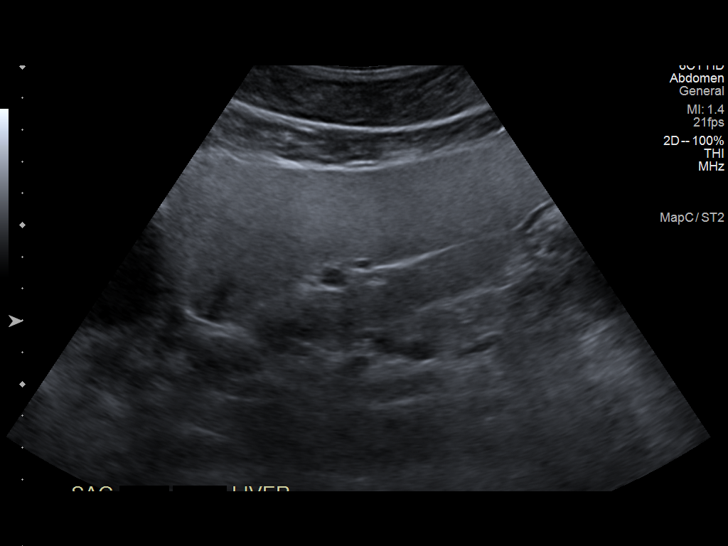
[im 16/47]
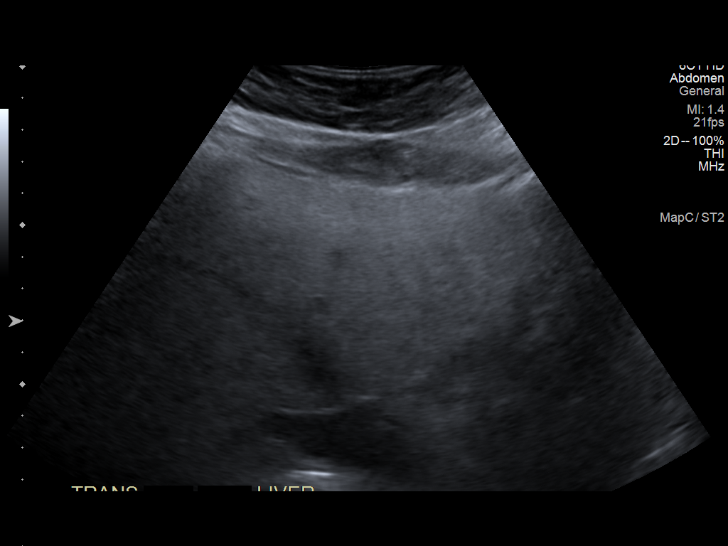
[im 18/47]
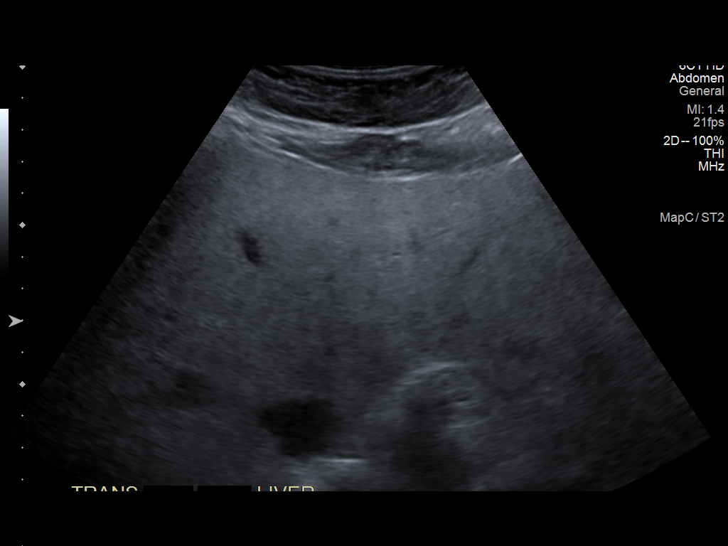
[im 22/47]
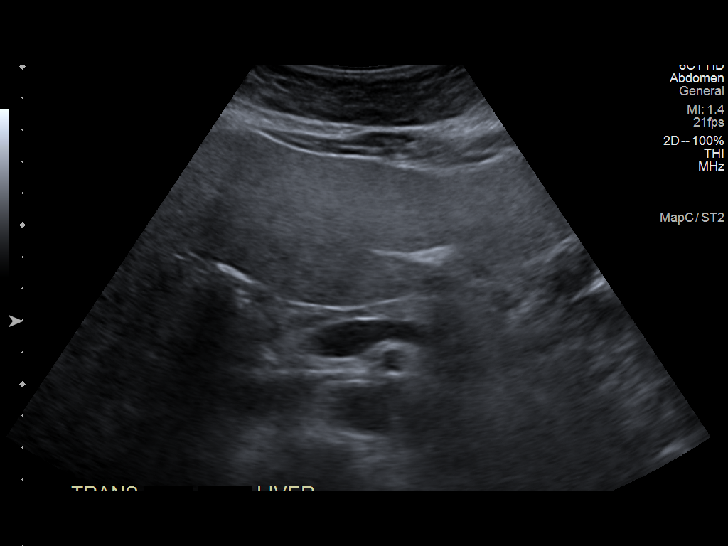
[im 25/47]
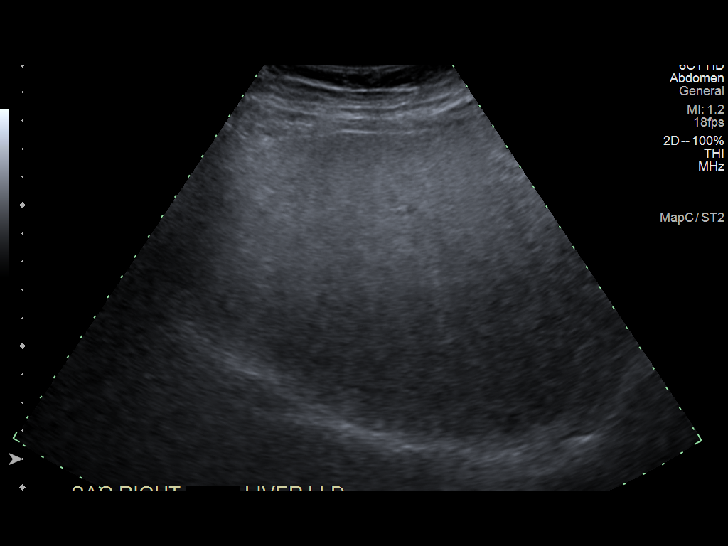
[im 29/47]
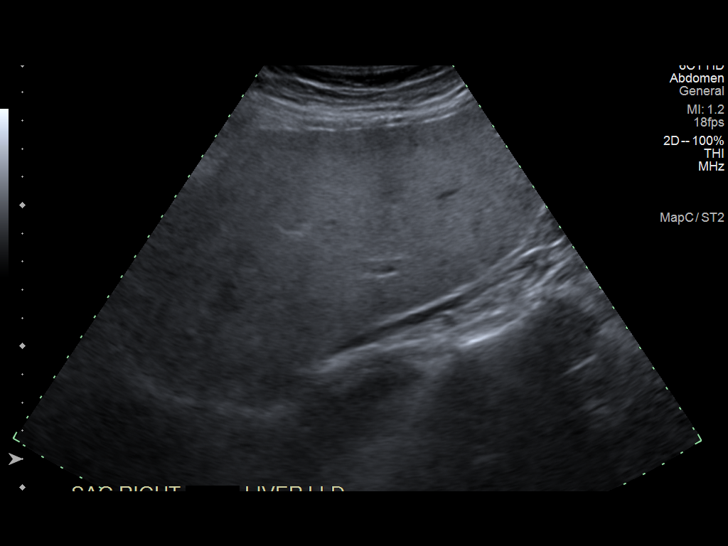
[im 31/47]
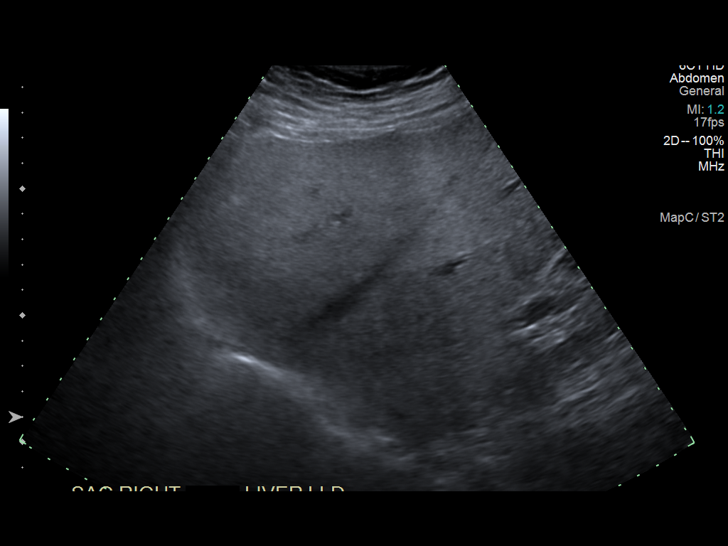
[im 35/47]
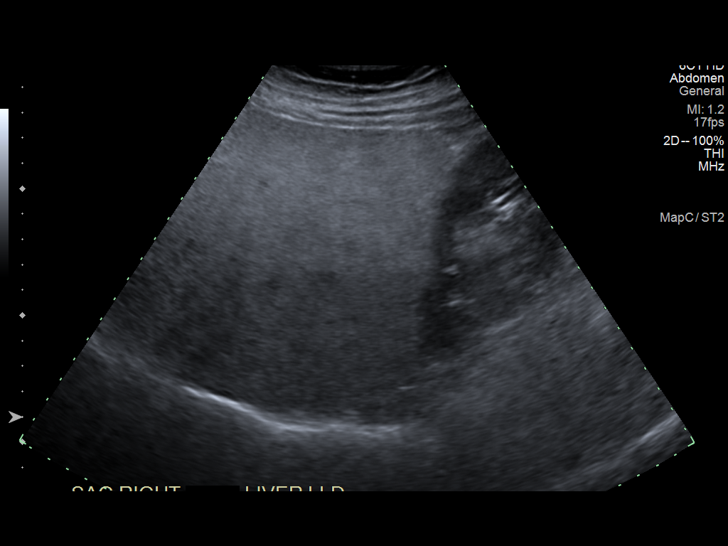
[im 39/47]
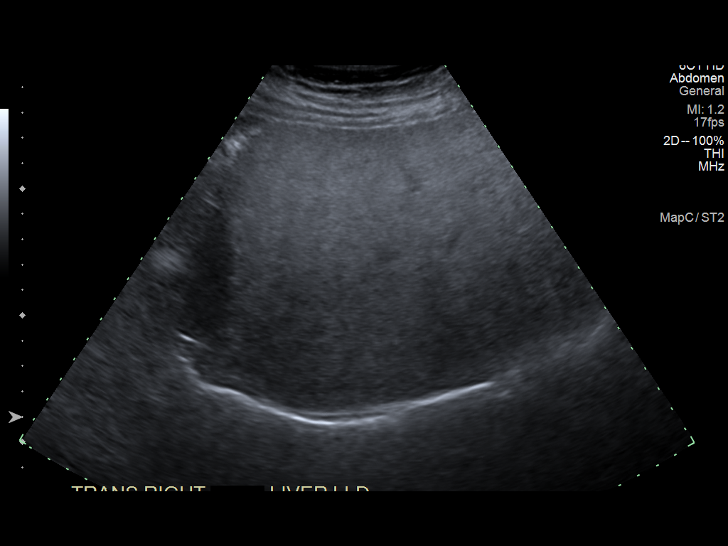
[im 43/47]
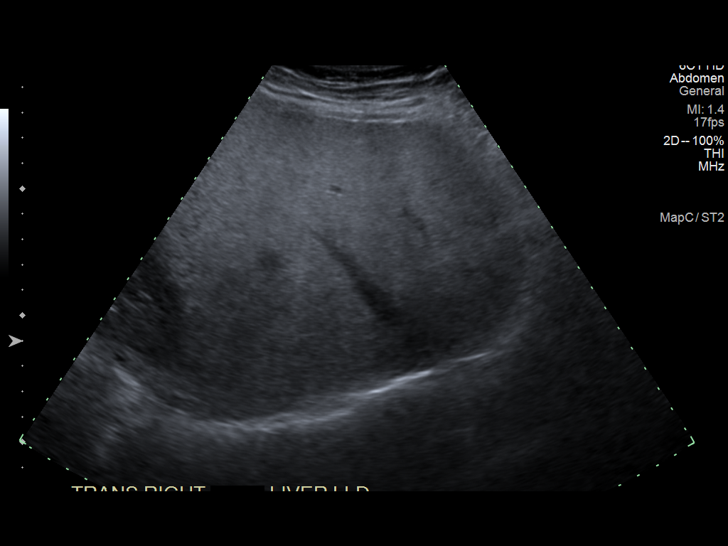
[im 47/47]
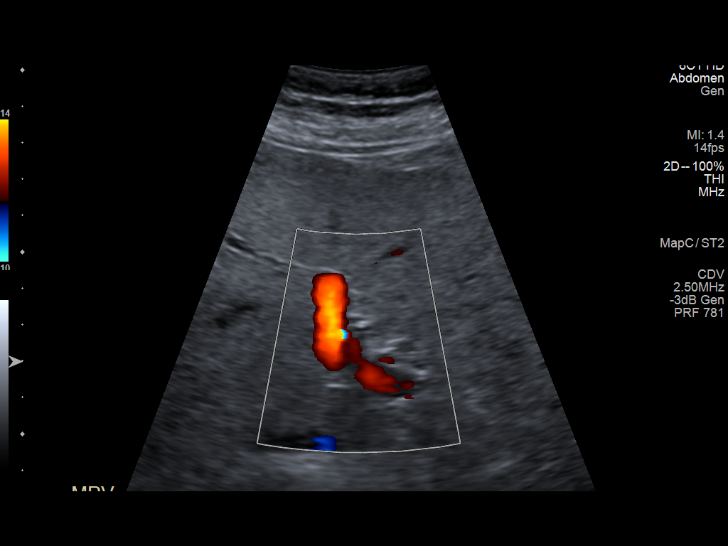

[14 of 25 positions shown; findings below may reference images not displayed]

FINDINGS: Gallbladder:

Surgically absent.

Common bile duct:

Diameter: 3 mm

Liver:

No focal lesion identified. Increased parenchymal echogenicity.
Portal vein is patent on color Doppler imaging with normal direction
of blood flow towards the liver.

Other: None.
IMPRESSION: Hepatic steatosis.

## 2022-09-23 ENCOUNTER — Ambulatory Visit (INDEPENDENT_AMBULATORY_CARE_PROVIDER_SITE_OTHER): Payer: Medicare Other

## 2022-09-23 VITALS — Ht 64.0 in | Wt 172.0 lb

## 2022-09-23 DIAGNOSIS — Z0000 Encounter for general adult medical examination without abnormal findings: Secondary | ICD-10-CM

## 2022-09-23 NOTE — Patient Instructions (Signed)
[name] , Thank you for taking time to come for your Medicare Wellness Visit. I appreciate your ongoing commitment to your health goals. Please review the following plan we discussed and let me know if I can assist you in the future.   These are the goals we discussed:  Goals      DIET - EAT MORE FRUITS AND VEGETABLES     Patient Stated     07/04/2020, I will continue to get 10,000 steps in daily.      Patient Stated     Would like to maintain walking routine.      Weight (lb) < 141 lb (64 kg)     Target weight is 140 lbs. Starting 06/06/2018, I will attempt to monitor intake of white bread, white potatoes, and other simple carbs.         This is a list of the screening recommended for you and due dates:  Health Maintenance  Topic Date Due   COVID-19 Vaccine (5 - 2023-24 season) 04/30/2022   Zoster (Shingles) Vaccine (2 of 2) 10/26/2022*   Yearly kidney health urinalysis for diabetes  07/27/2023*   Complete foot exam   07/27/2023*   Eye exam for diabetics  12/09/2022   Mammogram  12/17/2022   Hemoglobin A1C  01/24/2023   Yearly kidney function blood test for diabetes  07/27/2023   Colon Cancer Screening  08/11/2023   Medicare Annual Wellness Visit  09/24/2023   DTaP/Tdap/Td vaccine (4 - Td or Tdap) 04/26/2029   Pneumonia Vaccine  Completed   Flu Shot  Completed   DEXA scan (bone density measurement)  Completed   Hepatitis C Screening: USPSTF Recommendation to screen - Ages 97-79 yo.  Completed   HPV Vaccine  Aged Out  *Topic was postponed. The date shown is not the original due date.    Advanced directives: no  Conditions/risks identified: none  Next appointment: Follow up in one year for your annual wellness visit 09/26/23 @ 12:15 pm by phone   Preventive Care 65 Years and Older, Female Preventive care refers to lifestyle choices and visits with your health care provider that can promote health and wellness. What does preventive care include? A yearly physical  exam. This is also called an annual well check. Dental exams once or twice a year. Routine eye exams. Ask your health care provider how often you should have your eyes checked. Personal lifestyle choices, including: Daily care of your teeth and gums. Regular physical activity. Eating a healthy diet. Avoiding tobacco and drug use. Limiting alcohol use. Practicing safe sex. Taking low-dose aspirin every day. Taking vitamin and mineral supplements as recommended by your health care provider. What happens during an annual well check? The services and screenings done by your health care provider during your annual well check will depend on your age, overall health, lifestyle risk factors, and family history of disease. Counseling  Your health care provider may ask you questions about your: Alcohol use. Tobacco use. Drug use. Emotional well-being. Home and relationship well-being. Sexual activity. Eating habits. History of falls. Memory and ability to understand (cognition). Work and work Statistician. Reproductive health. Screening  You may have the following tests or measurements: Height, weight, and BMI. Blood pressure. Lipid and cholesterol levels. These may be checked every 5 years, or more frequently if you are over 6 years old. Skin check. Lung cancer screening. You may have this screening every year starting at age 32 if you have a 30-pack-year history of smoking  and currently smoke or have quit within the past 15 years. Fecal occult blood test (FOBT) of the stool. You may have this test every year starting at age 2. Flexible sigmoidoscopy or colonoscopy. You may have a sigmoidoscopy every 5 years or a colonoscopy every 10 years starting at age 23. Hepatitis C blood test. Hepatitis B blood test. Sexually transmitted disease (STD) testing. Diabetes screening. This is done by checking your blood sugar (glucose) after you have not eaten for a while (fasting). You may have this  done every 1-3 years. Bone density scan. This is done to screen for osteoporosis. You may have this done starting at age 96. Mammogram. This may be done every 1-2 years. Talk to your health care provider about how often you should have regular mammograms. Talk with your health care provider about your test results, treatment options, and if necessary, the need for more tests. Vaccines  Your health care provider may recommend certain vaccines, such as: Influenza vaccine. This is recommended every year. Tetanus, diphtheria, and acellular pertussis (Tdap, Td) vaccine. You may need a Td booster every 10 years. Zoster vaccine. You may need this after age 97. Pneumococcal 13-valent conjugate (PCV13) vaccine. One dose is recommended after age 14. Pneumococcal polysaccharide (PPSV23) vaccine. One dose is recommended after age 62. Talk to your health care provider about which screenings and vaccines you need and how often you need them. This information is not intended to replace advice given to you by your health care provider. Make sure you discuss any questions you have with your health care provider. Document Released: 09/12/2015 Document Revised: 05/05/2016 Document Reviewed: 06/17/2015 Elsevier Interactive Patient Education  2017 [employer] Prevention in the Home Falls can cause injuries. They can happen to people of all ages. There are many things you can do to make your home safe and to help prevent falls. What can I do on the outside of my home? Regularly fix the edges of walkways and driveways and fix any cracks. Remove anything that might make you trip as you walk through a door, such as a raised step or threshold. Trim any bushes or trees on the path to your home. Use bright outdoor lighting. Clear any walking paths of anything that might make someone trip, such as rocks or tools. Regularly check to see if handrails are loose or broken. Make sure that both sides of any steps have  handrails. Any raised decks and porches should have guardrails on the edges. Have any leaves, snow, or ice cleared regularly. Use sand or salt on walking paths during winter. Clean up any spills in your garage right away. This includes oil or grease spills. What can I do in the bathroom? Use night lights. Install grab bars by the toilet and in the tub and shower. Do not use towel bars as grab bars. Use non-skid mats or decals in the tub or shower. If you need to sit down in the shower, use a plastic, non-slip stool. Keep the floor dry. Clean up any water that spills on the floor as soon as it happens. Remove soap buildup in the tub or shower regularly. Attach bath mats securely with double-sided non-slip rug tape. Do not have throw rugs and other things on the floor that can make you trip. What can I do in the bedroom? Use night lights. Make sure that you have a light by your bed that is easy to reach. Do not use any sheets or blankets that are  too big for your bed. They should not hang down onto the floor. Have a firm chair that has side arms. You can use this for support while you get dressed. Do not have throw rugs and other things on the floor that can make you trip. What can I do in the kitchen? Clean up any spills right away. Avoid walking on wet floors. Keep items that you use a lot in easy-to-reach places. If you need to reach something above you, use a strong step stool that has a grab bar. Keep electrical cords out of the way. Do not use floor polish or wax that makes floors slippery. If you must use wax, use non-skid floor wax. Do not have throw rugs and other things on the floor that can make you trip. What can I do with my stairs? Do not leave any items on the stairs. Make sure that there are handrails on both sides of the stairs and use them. Fix handrails that are broken or loose. Make sure that handrails are as long as the stairways. Check any carpeting to make sure  that it is firmly attached to the stairs. Fix any carpet that is loose or worn. Avoid having throw rugs at the top or bottom of the stairs. If you do have throw rugs, attach them to the floor with carpet tape. Make sure that you have a light switch at the top of the stairs and the bottom of the stairs. If you do not have them, ask someone to add them for you. What else can I do to help prevent falls? Wear shoes that: Do not have high heels. Have rubber bottoms. Are comfortable and fit you well. Are closed at the toe. Do not wear sandals. If you use a stepladder: Make sure that it is fully opened. Do not climb a closed stepladder. Make sure that both sides of the stepladder are locked into place. Ask someone to hold it for you, if possible. Clearly mark and make sure that you can see: Any grab bars or handrails. First and last steps. Where the edge of each step is. Use tools that help you move around (mobility aids) if they are needed. These include: Canes. Walkers. Scooters. Crutches. Turn on the lights when you go into a dark area. Replace any light bulbs as soon as they burn out. Set up your furniture so you have a clear path. Avoid moving your furniture around. If any of your floors are uneven, fix them. If there are any pets around you, be aware of where they are. Review your medicines with your doctor. Some medicines can make you feel dizzy. This can increase your chance of falling. Ask your doctor what other things that you can do to help prevent falls. This information is not intended to replace advice given to you by your health care provider. Make sure you discuss any questions you have with your health care provider. Document Released: 06/12/2009 Document Revised: 01/22/2016 Document Reviewed: 09/20/2014 Elsevier Interactive Patient Education  2017 [employer].

## 2022-09-23 NOTE — Progress Notes (Signed)
Virtual Visit via Telephone Note  I connected with  [name] Silveira on 09/23/22 at 10:00 AM EST by telephone and verified that I am speaking with the correct person using two identifiers.  Location: Patient: home Provider: [place] Persons participating in the virtual visit: [place]   I discussed the limitations, risks, security and privacy concerns of performing an evaluation and management service by telephone and the availability of in person appointments. The patient expressed understanding and agreed to proceed.  Interactive audio and video telecommunications were attempted between this nurse and patient, however failed, due to patient having technical difficulties OR patient did not have access to video capability.  We continued and completed visit with audio only.  Some vital signs may be absent or patient reported.   Dionisio David, LPN  Subjective:   SHYANN HEFNER is a 72 y.o. female who presents for Medicare Annual (Subsequent) preventive examination.  Review of Systems     Cardiac Risk Factors include: advanced age (>71mn, >>60women);diabetes mellitus;hypertension     Objective:    Today's Vitals   09/23/22 0955  PainSc: 4    There is no height or weight on file to calculate BMI.     09/23/2022   10:03 AM 09/21/2021    9:09 AM 08/01/2021    4:00 AM 08/01/2021    3:50 AM 07/31/2021    8:19 PM 07/16/2021   10:35 AM 07/04/2020    2:07 PM  Advanced Directives  Does Patient Have a Medical Advance Directive? No Yes  Yes Yes Yes Yes  Type of Advance Directive  Living will;Healthcare Power of APlayita Cort[place]Living will HJaconitaOut of facility DNR (pink MOST or yellow form);Living will [place] will [place]Living will  Does patient want to make changes to medical advance directive?   No - Patient declined   Yes (MAU/Ambulatory/Procedural Areas -  Information given)   Copy of [place]in Chart?    No - copy requested   No - copy requested  Would patient like information on creating a medical advance directive? No - Patient declined          Current Medications (verified) Outpatient Encounter Medications as of 09/23/2022  Medication Sig   acetaminophen (TYLENOL) 500 MG tablet Take 1,000 mg by mouth at bedtime.   ALPRAZolam (XANAX) 1 MG tablet TAKE 1/2 TABLET BY MOUTH AT BEDTIME AS NEEDED FOR ANXIETY OR SLEEP   aspirin EC 81 MG tablet Take 81 mg by mouth daily. Swallow whole.   Calcium-Magnesium-Vitamin D (CALCIUM 1200+D3 PO) Take 1 tablet by mouth at bedtime.   diclofenac Sodium (VOLTAREN) 1 % GEL APPLY 4 GRAMS TO AFFECTED AREA 4 TIMES DAILY AS NEEDED   Evolocumab (REPATHA SURECLICK) 1789MG/ML SOAJ Inject 140 mg into the skin every 14 (fourteen) days.   Melatonin 10 MG CAPS Take 10 mg by mouth at bedtime.   metoprolol succinate (TOPROL-XL) 25 MG 24 hr tablet TAKE 1/2 TABLET BY MOUTH EVERY DAY   Multiple Vitamins-Minerals (EYE VITAMINS PO) Take 1 capsule by mouth daily.   pantoprazole (PROTONIX) 40 MG tablet TAKE 1 TABLET BY MOUTH TWICE  DAILY   triamcinolone cream (KENALOG) 0.1 % APPLY 1 APPLICATION TOPICALLY 2 (TWO) TIMES DAILY AS NEEDED. RASH   ASPERCREME LIDOCAINE EX Apply 1 application topically at bedtime as needed (foot pain). (Patient not taking: Reported on 09/23/2022)   benzonatate (TESSALON) 200 MG  capsule Take 1 capsule (200 mg total) by mouth 3 (three) times daily as needed for cough. Swallow whole, do not bite pill (Patient not taking: Reported on 09/23/2022)   No facility-administered encounter medications on file as of 09/23/2022.    Allergies (verified) Atorvastatin, Ezetimibe, Polyoxyethylene 40 sorbitol septaoleate [sorbitan], Prednisone, Rosuvastatin, Shingrix [zoster vac recomb adjuvanted], and Sulfamethoxazole-trimethoprim   History: Past Medical History:  Diagnosis Date   Anemia    Anxiety     BBB (bundle branch block)    Complication of anesthesia    Depression    Dyspnea    Fatty liver    Fibrocystic disease of both breasts    GERD (gastroesophageal reflux disease)    Hyperglycemia    PONV (postoperative nausea and vomiting)    Pre-diabetes    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   On continue HRT since 1980 per patient   Sphincter of Oddi dysfunction    Past Surgical History:  Procedure Laterality Date   ABDOMINAL HYSTERECTOMY     CARPAL TUNNEL RELEASE  08/2008   left   CHOLECYSTECTOMY     ERCP     HEEL SPUR RESECTION Left 09/29/2021   Procedure: LEFT HEEL SPUR EXCISION AND ACHILLES TENDON REPAIR;  Surgeon: Melrose Nakayama, MD;  Location: [facility];  Service: Orthopedics;  Laterality: Left;   LEFT HEART CATHETERIZATION WITH CORONARY ANGIOGRAM N/A 06/08/2013   Procedure: LEFT HEART CATHETERIZATION WITH CORONARY ANGIOGRAM;  Surgeon: Ramond Dial, MD;  Location: [facility] CATH LAB;  Service: Cardiovascular;  Laterality: N/A;   MASTECTOMY     bilateral for severe macrocystic breast   SPINE SURGERY N/A 02/12/2020   neck   TUBAL LIGATION     Family History  Problem Relation Age of Onset   Brain cancer Mother        hospice   Diabetes Father    Hypertension Father    Cancer Father        prostate and leukemia   Breast cancer Sister 62   Cancer Sister    Cancer Maternal Aunt        Pancreatic   Diabetes Maternal Uncle    Heart disease Maternal Grandmother    Social History   Socioeconomic History   Marital status: Married    Spouse name: Not on file   Number of children: Not on file   Years of education: Not on file   Highest education level: Not on file  Occupational History   Not on file  Tobacco Use   Smoking status: Never   Smokeless tobacco: Never  Vaping Use   Vaping Use: Never used  Substance and Sexual Activity   Alcohol use: Yes    Comment: once every 2 weeks   Drug use: No   Sexual activity: Yes    Birth control/protection:  Surgical  Other Topics Concern   Not on file  Social History Narrative   Not on file   Social Determinants of Health   Financial Resource Strain: Low Risk  (09/23/2022)   Overall Financial Resource Strain (CARDIA)    Difficulty of Paying Living Expenses: Not hard at all  Food Insecurity: No Food Insecurity (09/23/2022)   Hunger Vital Sign    Worried About Running Out of Food in the Last Year: Never true    [place] in the Last Year: Never true  Transportation Needs: No Transportation Needs (09/23/2022)   PRAPARE - Hydrologist (Medical): No  Lack of Transportation (Non-Medical): No  Physical Activity: Insufficiently Active (09/23/2022)   Exercise Vital Sign    Days of Exercise per Week: 4 days    Minutes of Exercise per Session: 30 min  Stress: No Stress Concern Present (09/23/2022)   [place]    Feeling of Stress : Not at all  Social Connections: Moderately Integrated (09/23/2022)   Social Connection and Isolation Panel [NHANES]    Frequency of Communication with Friends and Family: More than three times a week    Frequency of Social Gatherings with Friends and Family: Once a week    Attends Religious Services: More than 4 times per year    Active Member of [employer] or Organizations: No    Attends Music therapist: Never    Marital Status: Married    Tobacco Counseling Counseling given: Not Answered   Clinical Intake:  Pre-visit preparation completed: Yes  Pain : 0-10 Pain Score: 4  Pain Location: Toe (Comment which one) Pain Orientation: Left     Nutritional Risks: None Diabetes: Yes CBG done?: No Did pt. bring in CBG monitor from home?: No  How often do you need to have someone help you when you read instructions, pamphlets, or other written materials from your doctor or pharmacy?: 1 - Never  Diabetic?yes Nutrition Risk Assessment:  Has the  patient had any N/V/D within the last 2 months?  No  Does the patient have any non-healing wounds?  No  Has the patient had any unintentional weight loss or weight gain?  No   Diabetes:  Is the patient diabetic?  Yes  If diabetic, was a CBG obtained today?  No  Did the patient bring in their glucometer from home?  No  How often do you monitor your CBG's? occasionally   Financial Strains and Diabetes Management:  Are you having any financial strains with the device, your supplies or your medication? No .  Does the patient want to be seen by Chronic Care Management for management of their diabetes?  No  Would the patient like to be referred to a Nutritionist or for Diabetic Management?  No   Diabetic Exams:  Diabetic Eye Exam: Completed 12/08/21. Overdue for diabetic eye exam. Pt has been advised about the importance in completing this exam. A referral has been placed today. Message sent to referral coordinator for scheduling purposes. Advised pt to expect a call from office referred to regarding appt.  Diabetic Foot Exam: Completed 07/11/20. Pt has been advised about the importance in completing this exam.     Interpreter Needed?: No  Information entered by :: Kirke Shaggy, LPN   Activities of Daily Living    09/23/2022   10:04 AM  In your present state of health, do you have any difficulty performing the following activities:  Hearing? 0  Vision? 0  Difficulty concentrating or making decisions? 0  Walking or climbing stairs? 0  Dressing or bathing? 0  Doing errands, shopping? 0  Preparing Food and eating ? N  Using the Toilet? N  In the past six months, have you accidently leaked urine? N  Do you have problems with loss of bowel control? N  Managing your Medications? N  Managing your Finances? N  Housekeeping or managing your Housekeeping? N    Patient Care Team: Tower, Wynelle Fanny, MD as PCP - General Berniece Salines, DO as PCP - Cardiology (Cardiology) Caren Macadam, MD as Consulting Physician (Obstetrics  and Gynecology) Sharyne Peach, MD as Consulting Physician (Ophthalmology)  Indicate any recent Medical Services you may have received from other than [facility] providers in the past year (date may be approximate).     Assessment:   This is a routine wellness examination for [place].  Hearing/Vision screen Hearing Screening - Comments:: No aids Vision Screening - Comments:: Wears glasses- Dr.Gould  Dietary issues and exercise activities discussed: Current Exercise Habits: Home exercise routine, Type of exercise: walking, Time (Minutes): 30, Frequency (Times/Week): 4, Weekly Exercise (Minutes/Week): 120, Intensity: Mild   Goals Addressed             This Visit's Progress    DIET - EAT MORE FRUITS AND VEGETABLES         Depression Screen    09/23/2022   10:01 AM 07/26/2022   11:16 AM 07/26/2022   10:28 AM 07/04/2020    2:09 PM 07/10/2019   10:43 AM 06/06/2018    8:11 AM 05/26/2017    8:27 AM  PHQ 2/9 Scores  PHQ - 2 Score 0 0 0 0 0 0 1  PHQ- 9 Score 0  4 0  5 2    Fall Risk    09/23/2022   10:04 AM 07/16/2021   10:42 AM 07/04/2020    2:08 PM 07/10/2019   10:43 AM 06/06/2018    8:11 AM  Fall Risk   Falls in the past year? 0 1 0 1 No  Number falls in past yr: 0 0 0 0   Injury with Fall? 0 0 0 0   Risk for fall due to : No Fall Risks Other (Comment) Medication side effect    Risk for fall due to: Comment  fell in a hole in the back yard     Follow up Falls prevention discussed;Falls evaluation completed Falls prevention discussed Falls evaluation completed;Falls prevention discussed Falls evaluation completed     FALL RISK PREVENTION PERTAINING TO THE HOME:  Any stairs in or around the home? No  If so, are there any without handrails? No  Home free of loose throw rugs in walkways, pet beds, electrical cords, etc? Yes  Adequate lighting in your home to reduce risk of falls? Yes   ASSISTIVE DEVICES UTILIZED TO PREVENT  FALLS:  Life alert? No  Use of a cane, walker or w/c? No  Grab bars in the bathroom? No  Shower chair or bench in shower? No  Elevated toilet seat or a handicapped toilet? Yes    Cognitive Function:    07/04/2020    2:13 PM 06/06/2018    8:11 AM 05/10/2016    1:40 PM  MMSE - Mini Mental State Exam  Orientation to time '5 5 5  '$ Orientation to Place '5 5 5  '$ Registration '3 3 3  '$ Attention/ Calculation 5 0 0  Recall '3 3 3  '$ Language- name 2 objects  0 0  Language- repeat '1 1 1  '$ Language- follow 3 step command  3 3  Language- read & follow direction  0 0  Write a sentence  0 0  Copy design  0 0  Total score  20 20        09/23/2022   10:10 AM  6CIT Screen  What Year? 0 points  What month? 0 points  What time? 0 points  Count back from 20 0 points  Months in reverse 0 points  Repeat phrase 0 points  Total Score 0 points    Immunizations Immunization History  Administered Date(s) Administered   Fluad Quad(high Dose 65+) 06/18/2019, 06/12/2020   Influenza Whole 05/30/2008   Influenza, High Dose Seasonal PF 06/03/2021   Influenza,inj,Quad PF,6+ Mos 05/03/2014, 06/06/2015, 05/10/2016, 05/26/2017, 06/09/2018   Influenza-Unspecified 05/24/2022   Moderna SARS-COV2 Booster Vaccination 12/03/2020   PFIZER(Purple Top)SARS-COV-2 Vaccination 10/05/2019, 10/30/2019, 06/05/2020   Pfizer Covid-19 Vaccine Bivalent Booster 3yr & up 06/03/2021   Pneumococcal Conjugate-13 01/29/2015   Pneumococcal Polysaccharide-23 05/10/2016   Td 08/30/2002, 04/27/2019   Tdap 05/03/2014   Zoster Recombinat (Shingrix) 07/29/2021   Zoster, Live 09/07/2012    TDAP status: Up to date  Flu Vaccine status: Up to date  Pneumococcal vaccine status: Up to date  Covid-19 vaccine status: Completed vaccines  Qualifies for Shingles Vaccine? Yes   Zostavax completed Yes   Shingrix Completed?: No.    Education has been provided regarding the importance of this vaccine. Patient has been advised to call  insurance company to determine out of pocket expense if they have not yet received this vaccine. Advised may also receive vaccine at local pharmacy or Health Dept. Verbalized acceptance and understanding.  Screening Tests Health Maintenance  Topic Date Due   COVID-19 Vaccine (5 - 2023-24 season) 04/30/2022   Zoster Vaccines- Shingrix (2 of 2) 10/26/2022 (Originally 09/23/2021)   Diabetic kidney evaluation - Urine ACR  07/27/2023 (Originally 09/14/1968)   FOOT EXAM  07/27/2023 (Originally 07/11/2021)   OPHTHALMOLOGY EXAM  12/09/2022   MAMMOGRAM  12/17/2022   HEMOGLOBIN A1C  01/24/2023   Diabetic kidney evaluation - eGFR measurement  07/27/2023   COLONOSCOPY (Pts 45-420yrInsurance coverage will need to be confirmed)  08/11/2023   Medicare Annual Wellness (AWV)  09/24/2023   DTaP/Tdap/Td (4 - Td or Tdap) 04/26/2029   Pneumonia Vaccine 6512Years old  Completed   INFLUENZA VACCINE  Completed   DEXA SCAN  Completed   Hepatitis C Screening  Completed   HPV VACCINES  Aged Out    Health Maintenance  Health Maintenance Due  Topic Date Due   COVID-19 Vaccine (5 - 2023-24 season) 04/30/2022    Colorectal cancer screening: Type of screening: Colonoscopy. Completed 08/10/13. Repeat every 10 years Cologuard one year ago-3 years  Mammogram status: Completed 12/16/21. Repeat every year  Bone Density status: Completed 06/19/18. Results reflect: Bone density results: NORMAL. Repeat every 5 years.  Lung Cancer Screening: (Low Dose CT Chest recommended if Age 73-80ears, 30 pack-year currently smoking OR have quit w/in 15years.) does not qualify.   Additional Screening:  Hepatitis C Screening: does qualify; Completed 09/28/06  Vision Screening: Recommended annual ophthalmology exams for early detection of glaucoma and other disorders of the eye. Is the patient up to date with their annual eye exam?  Yes  Who is the provider or what is the name of the office in which the patient attends annual  eye exams? Dr.Gould If pt is not established with a provider, would they like to be referred to a provider to establish care? No .   Dental Screening: Recommended annual dental exams for proper oral hygiene  Community Resource Referral / Chronic Care Management: CRR required this visit?  No   CCM required this visit?  No      Plan:     I have personally reviewed and noted the following in the patient's chart:   Medical and social history Use of alcohol, tobacco or illicit drugs  Current medications and supplements including opioid prescriptions. Patient is not currently taking opioid prescriptions. Functional ability and status Nutritional  status Physical activity Advanced directives List of other physicians Hospitalizations, surgeries, and ER visits in previous 12 months Vitals Screenings to include cognitive, depression, and falls Referrals and appointments  In addition, I have reviewed and discussed with patient certain preventive protocols, quality metrics, and best practice recommendations. A written personalized care plan for preventive services as well as general preventive health recommendations were provided to patient.     Dionisio David, LPN   [id number]   Nurse Notes: none

## 2022-10-27 ENCOUNTER — Ambulatory Visit (INDEPENDENT_AMBULATORY_CARE_PROVIDER_SITE_OTHER): Payer: Medicare Other | Admitting: Family Medicine

## 2022-10-27 ENCOUNTER — Encounter: Payer: Self-pay | Admitting: Family Medicine

## 2022-10-27 VITALS — BP 124/78 | HR 65 | Temp 97.6°F | Ht 64.0 in | Wt 163.2 lb

## 2022-10-27 DIAGNOSIS — R7303 Prediabetes: Secondary | ICD-10-CM

## 2022-10-27 DIAGNOSIS — E119 Type 2 diabetes mellitus without complications: Secondary | ICD-10-CM | POA: Insufficient documentation

## 2022-10-27 DIAGNOSIS — K760 Fatty (change of) liver, not elsewhere classified: Secondary | ICD-10-CM

## 2022-10-27 LAB — HEPATIC FUNCTION PANEL
ALT: 22 U/L (ref 0–35)
AST: 16 U/L (ref 0–37)
Albumin: 4.3 g/dL (ref 3.5–5.2)
Alkaline Phosphatase: 72 U/L (ref 39–117)
Bilirubin, Direct: 0.2 mg/dL (ref 0.0–0.3)
Total Bilirubin: 1 mg/dL (ref 0.2–1.2)
Total Protein: 7.4 g/dL (ref 6.0–8.3)

## 2022-10-27 LAB — POCT GLYCOSYLATED HEMOGLOBIN (HGB A1C): Hemoglobin A1C: 6.3 % — AB (ref 4.0–5.6)

## 2022-10-27 NOTE — Progress Notes (Signed)
Subjective:    Patient ID: [name], female    DOB: [birth year], 72 y.o.   MRN: [id number]  HPI Pt presents for f/u of blood sugar/chronic medical problems   Wt Readings from Last 3 Encounters:  10/27/22 163 lb 4 oz (74 kg)  09/23/22 172 lb (78 kg)  07/26/22 172 lb (78 kg)   28.02 kg/m  Losing weight  Using the GOLO diet program  (also takes a supplement)  Eats protein/ fruits/ veg  Lower sugar  Lower fat  Is fairly easy to follow / does not crave between meals   For exercise  30 min walk / runs 15 min  Moves all day   Vitals:   10/27/22 0916  BP: 124/78  Pulse: 65  Temp: 97.6 F (36.4 C)  SpO2: 97%   Prediabetes Lab Results  Component Value Date   HGBA1C 6.6 (H) 07/26/2022    Planned to work on diet (was 6.1 prior)   Now down to 6.3- improved Happy about this    Lab Results  Component Value Date   CREATININE 0.68 07/26/2022   BUN 13 07/26/2022   NA 137 07/26/2022   K 4.4 07/26/2022   CL 100 07/26/2022   CO2 28 07/26/2022   GFR 86.4  Suspected fatty liver Lab Results  Component Value Date   ALT 40 (H) 07/26/2022   AST 29 07/26/2022   ALKPHOS 74 07/26/2022   BILITOT 1.0 07/26/2022    Patient Active Problem List   Diagnosis Date Noted   Minimal CAD 04/05/2022   Pain of left heel 09/29/2021   Fatty liver 09/11/2021   Right upper quadrant abdominal pain 09/11/2021   Insomnia 09/11/2021   Hyperlipidemia 08/06/2021   Vaccine reaction 08/06/2021   GAD (generalized anxiety disorder) 08/01/2021   Current use of proton pump inhibitor 07/20/2021   Colon cancer screening 07/20/2021   Hot flashes 07/11/2020   Subclinical hypothyroidism 07/11/2020   Allergy to statin medication 07/11/2020   Medicare annual wellness visit, subsequent 07/10/2019   Paresthesia 04/06/2019   Foot pain, bilateral 04/06/2019   Vaginal atrophy 06/09/2018   Breast cancer screening 05/31/2017   Prediabetes 06/02/2016   Estrogen deficiency 01/29/2015   Routine  general medical examination at a health care facility 02/20/2012   GERD 06/20/2008   Past Medical History:  Diagnosis Date   Anemia    Anxiety    BBB (bundle branch block)    Complication of anesthesia    Depression    Dyspnea    Fatty liver    Fibrocystic disease of both breasts    GERD (gastroesophageal reflux disease)    Hyperglycemia    PONV (postoperative nausea and vomiting)    Pre-diabetes    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   On continue HRT since 1980 per patient   Sphincter of Oddi dysfunction    Past Surgical History:  Procedure Laterality Date   ABDOMINAL HYSTERECTOMY     CARPAL TUNNEL RELEASE  08/2008   left   CHOLECYSTECTOMY     ERCP     HEEL SPUR RESECTION Left 09/29/2021   Procedure: LEFT HEEL SPUR EXCISION AND ACHILLES TENDON REPAIR;  Surgeon: Melrose Nakayama, MD;  Location: [facility];  Service: Orthopedics;  Laterality: Left;   LEFT HEART CATHETERIZATION WITH CORONARY ANGIOGRAM N/A 06/08/2013   Procedure: LEFT HEART CATHETERIZATION WITH CORONARY ANGIOGRAM;  Surgeon: Ramond Dial, MD;  Location: [facility] CATH LAB;  Service: Cardiovascular;  Laterality: N/A;   MASTECTOMY  bilateral for severe macrocystic breast   SPINE SURGERY N/A 02/12/2020   neck   TUBAL LIGATION     Social History   Tobacco Use   Smoking status: Never   Smokeless tobacco: Never  Vaping Use   Vaping Use: Never used  Substance Use Topics   Alcohol use: Yes    Comment: once every 2 weeks   Drug use: No   Family History  Problem Relation Age of Onset   Brain cancer Mother        hospice   Diabetes Father    Hypertension Father    Cancer Father        prostate and leukemia   Breast cancer Sister 55   Cancer Sister    Cancer Maternal Aunt        Pancreatic   Diabetes Maternal Uncle    Heart disease Maternal Grandmother    Allergies  Allergen Reactions   Atorvastatin Other (See Comments)    REACTION: rash   Ezetimibe Other (See Comments)    REACTION:  muscle aches   Polyoxyethylene 40 Sorbitol Septaoleate [Sorbitan] Other (See Comments)   Prednisone Other (See Comments)    **INJECTIONS ONLY** caused tachycardia    Rosuvastatin Other (See Comments)    Fatigue and weakness   Shingrix [Zoster Vac Recomb Adjuvanted]     Reaction ?  Weakness on one side    Sulfamethoxazole-Trimethoprim Other (See Comments)    Swelling of nostrils   Current Outpatient Medications on File Prior to Visit  Medication Sig Dispense Refill   acetaminophen (TYLENOL) 500 MG tablet Take 1,000 mg by mouth at bedtime.     ALPRAZolam (XANAX) 1 MG tablet TAKE 1/2 TABLET BY MOUTH AT BEDTIME AS NEEDED FOR ANXIETY OR SLEEP 15 tablet 3   ASPERCREME LIDOCAINE EX Apply 1 application  topically at bedtime as needed (foot pain).     aspirin EC 81 MG tablet Take 81 mg by mouth daily. Swallow whole.     benzonatate (TESSALON) 200 MG capsule Take 1 capsule (200 mg total) by mouth 3 (three) times daily as needed for cough. Swallow whole, do not bite pill 30 capsule 1   Calcium-Magnesium-Vitamin D (CALCIUM 1200+D3 PO) Take 1 tablet by mouth at bedtime.     diclofenac Sodium (VOLTAREN) 1 % GEL APPLY 4 GRAMS TO AFFECTED AREA 4 TIMES DAILY AS NEEDED 200 g 11   Evolocumab (REPATHA SURECLICK) [id number] MG/ML SOAJ Inject 140 mg into the skin every 14 (fourteen) days. 2 mL 12   Melatonin 10 MG CAPS Take 10 mg by mouth at bedtime.     metoprolol succinate (TOPROL-XL) 25 MG 24 hr tablet TAKE 1/2 TABLET BY MOUTH EVERY DAY 45 tablet 3   Multiple Vitamins-Minerals (EYE VITAMINS PO) Take 1 capsule by mouth daily.     OVER THE COUNTER MEDICATION daily. Release by GOLO     pantoprazole (PROTONIX) 40 MG tablet TAKE 1 TABLET BY MOUTH TWICE  DAILY 200 tablet 2   triamcinolone cream (KENALOG) 0.1 % APPLY 1 APPLICATION TOPICALLY 2 (TWO) TIMES DAILY AS NEEDED. RASH 30 g 3   No current facility-administered medications on file prior to visit.     Review of Systems  Constitutional:  Negative for activity  change, appetite change, fatigue, fever and unexpected weight change.  HENT:  Negative for congestion, ear pain, rhinorrhea, sinus pressure and sore throat.   Eyes:  Negative for pain, redness and visual disturbance.  Respiratory:  Negative for cough, shortness of breath and  wheezing.   Cardiovascular:  Negative for chest pain and palpitations.  Gastrointestinal:  Negative for abdominal pain, blood in stool, constipation and diarrhea.  Endocrine: Negative for polydipsia and polyuria.  Genitourinary:  Negative for dysuria, frequency and urgency.  Musculoskeletal:  Negative for arthralgias, back pain and myalgias.  Skin:  Negative for pallor and rash.  Allergic/Immunologic: Negative for environmental allergies.  Neurological:  Negative for dizziness, syncope and headaches.  Hematological:  Negative for adenopathy. Does not bruise/bleed easily.  Psychiatric/Behavioral:  Negative for decreased concentration and dysphoric mood. The patient is not nervous/anxious.        Objective:   Physical Exam Constitutional:      General: She is not in acute distress.    Appearance: Normal appearance. She is well-developed. She is not ill-appearing or diaphoretic.     Comments: Overweight   HENT:     Head: Normocephalic and atraumatic.  Eyes:     Conjunctiva/sclera: Conjunctivae normal.     Pupils: Pupils are equal, round, and reactive to light.  Neck:     Thyroid: No thyromegaly.     Vascular: No carotid bruit or JVD.  Cardiovascular:     Rate and Rhythm: Normal rate and regular rhythm.     Heart sounds: Normal heart sounds.     No gallop.  Pulmonary:     Effort: Pulmonary effort is normal. No respiratory distress.     Breath sounds: Normal breath sounds. No wheezing or rales.  Abdominal:     General: Abdomen is flat. There is no distension or abdominal bruit.     Palpations: Abdomen is soft.  Musculoskeletal:     Cervical back: Normal range of motion and neck supple.     Right lower leg:  No edema.     Left lower leg: No edema.  Lymphadenopathy:     Cervical: No cervical adenopathy.  Skin:    General: Skin is warm and dry.     Coloration: Skin is not pale.     Findings: No rash.  Neurological:     Mental Status: She is alert.     Coordination: Coordination normal.     Deep Tendon Reflexes: Reflexes are normal and symmetric. Reflexes normal.  Psychiatric:        Mood and Affect: Mood normal.           Assessment & Plan:   Problem List Items Addressed This Visit       Digestive   Fatty liver    Pt has lost 9 lb with GOLO program  Doing great Lower fat and sugar  Expect improvement inliver la No pain       Relevant Orders   Hepatic function panel     Endocrine   RESOLVED: Controlled type 2 diabetes mellitus without complication, without long-term current use of insulin (HCC)    No longer diabetic       Relevant Orders   POCT HgB A1C (Completed)     Other   Prediabetes - Primary    Improvement with better diet/exercise and 9 lb wt loss  Lab Results  Component Value Date   HGBA1C 6.3 (A) 10/27/2022   Will continue to monitor disc imp of low glycemic diet and wt loss to prevent DM2

## 2022-10-27 NOTE — Assessment & Plan Note (Signed)
Improvement with better diet/exercise and 9 lb wt loss  Lab Results  Component Value Date   HGBA1C 6.3 (A) 10/27/2022    Will continue to monitor disc imp of low glycemic diet and wt loss to prevent DM2

## 2022-10-27 NOTE — Assessment & Plan Note (Signed)
No longer diabetic

## 2022-10-27 NOTE — Patient Instructions (Signed)
Keep up the good work  Lab today for liver   Your A1c is back down to 6.3   Drink water  Stay active   Think about some strength training also -helps metabolism and weight loss more

## 2022-10-27 NOTE — Assessment & Plan Note (Signed)
Pt has lost 9 lb with GOLO program  Doing great Lower fat and sugar  Expect improvement inliver la No pain

## 2022-11-21 ENCOUNTER — Other Ambulatory Visit: Payer: Self-pay | Admitting: Family Medicine

## 2022-12-14 DIAGNOSIS — H524 Presbyopia: Secondary | ICD-10-CM | POA: Diagnosis not present

## 2022-12-14 DIAGNOSIS — H2513 Age-related nuclear cataract, bilateral: Secondary | ICD-10-CM | POA: Diagnosis not present

## 2022-12-14 DIAGNOSIS — H5203 Hypermetropia, bilateral: Secondary | ICD-10-CM | POA: Diagnosis not present

## 2022-12-14 DIAGNOSIS — E119 Type 2 diabetes mellitus without complications: Secondary | ICD-10-CM | POA: Diagnosis not present

## 2022-12-14 LAB — HM DIABETES EYE EXAM

## 2022-12-20 DIAGNOSIS — Z1231 Encounter for screening mammogram for malignant neoplasm of breast: Secondary | ICD-10-CM | POA: Diagnosis not present

## 2022-12-20 LAB — HM MAMMOGRAPHY

## 2023-01-15 ENCOUNTER — Other Ambulatory Visit: Payer: Self-pay | Admitting: Family Medicine

## 2023-03-23 ENCOUNTER — Other Ambulatory Visit: Payer: Self-pay | Admitting: Family Medicine

## 2023-03-24 NOTE — Telephone Encounter (Signed)
Name of Medication: Xanax Name of Pharmacy: [facility] Last Fill or Written Date and Quantity: 11/22/22 #15 tab/ 3 refill Last Office Visit and Type: f/u on 10/27/22 Next Office Visit and Type: none scheduled

## 2023-04-14 ENCOUNTER — Encounter: Payer: Self-pay | Admitting: Cardiology

## 2023-04-14 ENCOUNTER — Ambulatory Visit: Payer: Medicare Other | Attending: Cardiology | Admitting: Cardiology

## 2023-04-14 VITALS — BP 140/90 | HR 61 | Ht 64.0 in | Wt 164.8 lb

## 2023-04-14 DIAGNOSIS — R002 Palpitations: Secondary | ICD-10-CM | POA: Diagnosis not present

## 2023-04-14 DIAGNOSIS — R7303 Prediabetes: Secondary | ICD-10-CM

## 2023-04-14 DIAGNOSIS — Z79899 Other long term (current) drug therapy: Secondary | ICD-10-CM | POA: Diagnosis not present

## 2023-04-14 DIAGNOSIS — I2510 Atherosclerotic heart disease of native coronary artery without angina pectoris: Secondary | ICD-10-CM | POA: Diagnosis not present

## 2023-04-14 MED ORDER — METOPROLOL SUCCINATE ER 25 MG PO TB24: 12.5000 mg | ORAL_TABLET | Freq: Every day | ORAL | 3 refills | Status: DC

## 2023-04-14 MED ORDER — REPATHA SURECLICK 140 MG/ML ~~LOC~~ SOAJ: 140.0000 mg | SUBCUTANEOUS | 12 refills | Status: DC

## 2023-04-14 NOTE — Patient Instructions (Signed)
Medication Instructions:   Your physician recommends that you continue on your current medications as directed. Please refer to the Current Medication list given to you today.   *If you need a refill on your cardiac medications before your next appointment, please call your pharmacy*   Lab Work:  TODAY!!!!! CMET/MAG/A1C  If you have labs (blood work) drawn today and your tests are completely normal, you will receive your results only by: MyChart Message (if you have MyChart) OR A paper copy in the mail If you have any lab test that is abnormal or we need to change your treatment, we will call you to review the results.   Testing/Procedures:  None ordered.   Follow-Up: At [facility], you and your health needs are our priority.  As part of our continuing mission to provide you with exceptional heart care, we have created designated Provider Care Teams.  These Care Teams include your primary Cardiologist (physician) and Advanced Practice Providers (APPs -  Physician Assistants and Nurse Practitioners) who all work together to provide you with the care you need, when you need it.  We recommend signing up for the patient portal called "MyChart".  Sign up information is provided on this After Visit Summary.  MyChart is used to connect with patients for Virtual Visits (Telemedicine).  Patients are able to view lab/test results, encounter notes, upcoming appointments, etc.  Non-urgent messages can be sent to your provider as well.   To learn more about what you can do with MyChart, go to [email or web address].    Your next appointment:   1 year(s)  Provider:   Thomasene Ripple, DO     Other Instructions  Your physician wants you to follow-up in: 1 year with Dr. Servando Salina.  You will receive a reminder letter in the mail two months in advance. If you don't receive a letter, please call our office to schedule the follow-up appointment.

## 2023-04-15 LAB — COMPREHENSIVE METABOLIC PANEL
ALT: 21 IU/L (ref 0–32)
AST: 20 IU/L (ref 0–40)
Albumin: 4.6 g/dL (ref 3.8–4.8)
Alkaline Phosphatase: 80 IU/L (ref 44–121)
BUN/Creatinine Ratio: 22 (ref 12–28)
BUN: 14 mg/dL (ref 8–27)
Bilirubin Total: 0.7 mg/dL (ref 0.0–1.2)
CO2: 25 mmol/L (ref 20–29)
Calcium: 9.8 mg/dL (ref 8.7–10.3)
Chloride: 103 mmol/L (ref 96–106)
Creatinine, Ser: 0.64 mg/dL (ref 0.57–1.00)
Globulin, Total: 2.5 g/dL (ref 1.5–4.5)
Glucose: 106 mg/dL — ABNORMAL HIGH (ref 70–99)
Potassium: 4.5 mmol/L (ref 3.5–5.2)
Sodium: 141 mmol/L (ref 134–144)
Total Protein: 7.1 g/dL (ref 6.0–8.5)
eGFR: 93 mL/min/{1.73_m2} (ref >59)

## 2023-04-15 LAB — HEMOGLOBIN A1C
Est. average glucose Bld gHb Est-mCnc: 140 mg/dL
Hgb A1c MFr Bld: 6.5 % — ABNORMAL HIGH (ref 4.8–5.6)

## 2023-04-15 LAB — MAGNESIUM: Magnesium: 2.3 mg/dL (ref 1.6–2.3)

## 2023-04-17 NOTE — Progress Notes (Signed)
Cardiology Office Note:    Date:  04/17/2023   ID:  ERVINA [name], DOB May 22, 1951, MRN [id number]  PCP:  Judy Pimple, MD  Cardiologist:  Thomasene Ripple, DO  Electrophysiologist:  None   Referring MD: Judy Pimple, MD   " I am doing fine"  History of Present Illness:    [name] is a 72 y.o. 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  Her only concern is the fact that she may be going into the donut hole for her Repatha in a couple months.  No other complaints at this time.  Past Medical History:  Diagnosis Date   Anemia    Anxiety    BBB (bundle branch block)    Complication of anesthesia    Depression    Dyspnea    Fatty liver    Fibrocystic disease of both breasts    GERD (gastroesophageal reflux disease)    Hyperglycemia    PONV (postoperative nausea and vomiting)    Pre-diabetes    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   On continue HRT since 1980 per patient   Sphincter of Oddi dysfunction     Past Surgical History:  Procedure Laterality Date   ABDOMINAL HYSTERECTOMY     CARPAL TUNNEL RELEASE  08/2008   left   CHOLECYSTECTOMY     ERCP     HEEL SPUR RESECTION Left 09/29/2021   Procedure: LEFT HEEL SPUR EXCISION AND ACHILLES TENDON REPAIR;  Surgeon: Marcene Corning, MD;  Location: [facility];  Service: Orthopedics;  Laterality: Left;   LEFT HEART CATHETERIZATION WITH CORONARY ANGIOGRAM N/A 06/08/2013   Procedure: LEFT HEART CATHETERIZATION WITH CORONARY ANGIOGRAM;  Surgeon: Alvia Grove, MD;  Location: [facility] CATH LAB;  Service: Cardiovascular;  Laterality: N/A;   MASTECTOMY     bilateral for severe macrocystic breast   SPINE SURGERY N/A 02/12/2020   neck   TUBAL LIGATION      Current Medications: Current Meds  Medication Sig   acetaminophen (TYLENOL) 500 MG tablet Take 1,000 mg by mouth at bedtime.   ALPRAZolam (XANAX) 1 MG tablet  TAKE 1/2 TABLET BY MOUTH AT BEDTIME AS NEEDED FOR ANXIETY OR SLEEP   ASPERCREME LIDOCAINE EX Apply 1 application  topically at bedtime as needed (foot pain).   aspirin EC 81 MG tablet Take 81 mg by mouth daily. Swallow whole.   Calcium-Magnesium-Vitamin D (CALCIUM 1200+D3 PO) Take 1 tablet by mouth at bedtime.   diclofenac Sodium (VOLTAREN) 1 % GEL APPLY 4 GRAMS TO AFFECTED AREA 4 TIMES DAILY AS NEEDED   Melatonin 10 MG CAPS Take 10 mg by mouth at bedtime.   Multiple Vitamins-Minerals (EYE VITAMINS PO) Take 1 capsule by mouth daily.   OVER THE COUNTER MEDICATION daily. Release by GOLO   pantoprazole (PROTONIX) 40 MG tablet TAKE 1 TABLET BY MOUTH TWICE  DAILY   triamcinolone cream (KENALOG) 0.1 % APPLY 1 APPLICATION TOPICALLY 2 (TWO) TIMES DAILY AS NEEDED. RASH   [DISCONTINUED] Evolocumab (REPATHA SURECLICK) 140 MG/ML SOAJ Inject 140 mg into the skin every 14 (fourteen) days.   [DISCONTINUED] metoprolol succinate (TOPROL-XL) 25 MG 24 hr tablet TAKE 1/2 TABLET BY MOUTH EVERY DAY     Allergies:   Atorvastatin, Ezetimibe, Polyoxyethylene 40 sorbitol septaoleate [sorbitan], Prednisone, Rosuvastatin, Shingrix [zoster vac recomb adjuvanted], and Sulfamethoxazole-trimethoprim   Social History   Socioeconomic History   Marital status: Married  Spouse name: Not on file   Number of children: Not on file   Years of education: Not on file   Highest education level: Not on file  Occupational History   Not on file  Tobacco Use   Smoking status: Never   Smokeless tobacco: Never  Vaping Use   Vaping status: Never Used  Substance and Sexual Activity   Alcohol use: Yes    Comment: once every 2 weeks   Drug use: No   Sexual activity: Yes    Birth control/protection: Surgical  Other Topics Concern   Not on file  Social History Narrative   Not on file   Social Determinants of Health   Financial Resource Strain: Low Risk  (09/23/2022)   Overall Financial Resource Strain (CARDIA)    Difficulty  of Paying Living Expenses: Not hard at all  Food Insecurity: No Food Insecurity (09/23/2022)   Hunger Vital Sign    Worried About Running Out of Food in the Last Year: Never true    Ran Out of Food in the Last Year: Never true  Transportation Needs: No Transportation Needs (09/23/2022)   PRAPARE - Administrator, Civil Service (Medical): No    Lack of Transportation (Non-Medical): No  Physical Activity: Insufficiently Active (09/23/2022)   Exercise Vital Sign    Days of Exercise per Week: 4 days    Minutes of Exercise per Session: 30 min  Stress: No Stress Concern Present (09/23/2022)   [employer] of Occupational Health - Occupational Stress Questionnaire    Feeling of Stress : Not at all  Social Connections: Moderately Integrated (09/23/2022)   Social Connection and Isolation Panel [NHANES]    Frequency of Communication with Friends and Family: More than three times a week    Frequency of Social Gatherings with Friends and Family: Once a week    Attends Religious Services: More than 4 times per year    Active Member of [employer] or Organizations: No    Attends Engineer, structural: Never    Marital Status: Married     Family History: The patient's family history includes Brain cancer in her mother; Breast cancer (age of onset: 64) in her sister; Cancer in her father, maternal aunt, and sister; Diabetes in her father and maternal uncle; Heart disease in her maternal grandmother; Hypertension in her father.  ROS:   Review of Systems  Constitution: Negative for decreased appetite, fever and weight gain.  HENT: Negative for congestion, ear discharge, hoarse voice and sore throat.   Eyes: Negative for discharge, redness, vision loss in right eye and visual halos.  Cardiovascular: Negative for chest pain, dyspnea on exertion, leg swelling, orthopnea and palpitations.  Respiratory: Negative for cough, hemoptysis, shortness of breath and snoring.   Endocrine: Negative  for heat intolerance and polyphagia.  Hematologic/Lymphatic: Negative for bleeding problem. Does not bruise/bleed easily.  Skin: Negative for flushing, nail changes, rash and suspicious lesions.  Musculoskeletal: Negative for arthritis, joint pain, muscle cramps, myalgias, neck pain and stiffness.  Gastrointestinal: Negative for abdominal pain, bowel incontinence, diarrhea and excessive appetite.  Genitourinary: Negative for decreased libido, genital sores and incomplete emptying.  Neurological: Negative for brief paralysis, focal weakness, headaches and loss of balance.  Psychiatric/Behavioral: Negative for altered mental status, depression and suicidal ideas.  Allergic/Immunologic: Negative for HIV exposure and persistent infections.    EKGs/Labs/Other Studies Reviewed:    The following studies were reviewed today:   EKG: Sinus rhythm, heart rate 61 bpm with underlying  left bundle branch block.  CCTA 07/2021   Coronary Arteries:  Normal coronary origin.  Right dominance.   RCA is a large dominant artery that gives rise to PDA and PLA. There is no plaque.   Left main is a large artery that gives rise to LAD and LCX arteries. Noncalcified plaque in left main causes minimal (0-24%) stenosis   LAD is a large vessel. Noncalcified plaque in proximal LAD causes minimal (0-24%) stenosis   LCX is a non-dominant artery that gives rise to one large OM1 branch. Noncalcified plaque in proximal LCX causes minimal (0-24%) stenosis   Other findings:   Left Ventricle: Normal size   Left Atrium: Normal size   Pulmonary Veins: Normal configuration   Right Ventricle: Normal size   Right Atrium: Normal size   Cardiac valves: No calcifications   Thoracic aorta: Normal size   Pulmonary Arteries: Normal size   Systemic Veins: Normal drainage   Pericardium: Normal thickness   IMPRESSION: 1.  Coronary calcium score of 0.   2.  Normal coronary origin with right dominance.   3.   Nonobstructive CAD   4. Noncalcified plaque in left main, proximal LAD, and proximal LCX causes minimal (0-24%) stenosis   CAD-RADS 1. Minimal non-obstructive CAD (0-24%). Consider non-atherosclerotic causes of chest pain. Consider preventive therapy and risk factor modification.     Electronically Signed   By: Epifanio Lesches M.D.   On: 08/20/2021 22:07  Recent Labs: 07/26/2022: TSH 2.43 04/14/2023: ALT 21; BUN 14; Creatinine, Ser 0.64; Magnesium 2.3; Potassium 4.5; Sodium 141  Recent Lipid Panel    Component Value Date/Time   CHOL 153 07/26/2022 1123   TRIG 147.0 07/26/2022 1123   HDL 73.30 07/26/2022 1123   CHOLHDL 2 07/26/2022 1123   VLDL 29.4 07/26/2022 1123   LDLCALC 50 07/26/2022 1123   LDLDIRECT 97.0 06/06/2018 0812    Physical Exam:    VS:  BP (!) 140/90 (BP Location: Right Arm, Patient Position: Sitting, Cuff Size: Normal)   Pulse 61   Ht 5\' 4"  (1.626 m)   Wt 164 lb 12.8 oz (74.8 kg)   SpO2 96%   BMI 28.29 kg/m     Wt Readings from Last 3 Encounters:  04/14/23 164 lb 12.8 oz (74.8 kg)  10/27/22 163 lb 4 oz (74 kg)  09/23/22 172 lb (78 kg)     GEN: Well nourished, well developed in no acute distress HEENT: Normal NECK: No JVD; No carotid bruits LYMPHATICS: No lymphadenopathy CARDIAC: S1S2 noted,RRR, no murmurs, rubs, gallops RESPIRATORY:  Clear to auscultation without rales, wheezing or rhonchi  ABDOMEN: Soft, non-tender, non-distended, +bowel sounds, no guarding. EXTREMITIES: No edema, No cyanosis, no clubbing MUSCULOSKELETAL:  No deformity  SKIN: Warm and dry NEUROLOGIC:  Alert and oriented x 3, non-focal PSYCHIATRIC:  Normal affect, good insight  ASSESSMENT:    1. Palpitations   2. Pre-diabetes   3. Medication management   4. Mild CAD    PLAN:    Minimal coronary artery disease-she is on aspirin 81 mg daily.  She is currently on Repatha and has been doing well on this.  She is concerned about not being able to afford it in the coming  months.   Blood pressure is elevated in the office today, she prefers not to have any increased in her antihypertensive medication.  Discussed cutting back on increasing salt.  If this continues plan will be to increase the hypertensive medication.  Will send refills.  The patient is in  agreement with the above plan. The patient left the office in stable condition.  The patient will follow up in   Medication Adjustments/Labs and Tests Ordered: Current medicines are reviewed at length with the patient today.  Concerns regarding medicines are outlined above.  Orders Placed This Encounter  Procedures   Comprehensive Metabolic Panel (CMET)   Magnesium   HgB A1c   EKG 12-Lead   Meds ordered this encounter  Medications   metoprolol succinate (TOPROL-XL) 25 MG 24 hr tablet    Sig: Take 0.5 tablets (12.5 mg total) by mouth daily.    Dispense:  45 tablet    Refill:  3   Evolocumab (REPATHA SURECLICK) 140 MG/ML SOAJ    Sig: Inject 140 mg into the skin every 14 (fourteen) days.    Dispense:  2 mL    Refill:  12    Patient Instructions  Medication Instructions:   Your physician recommends that you continue on your current medications as directed. Please refer to the Current Medication list given to you today.   *If you need a refill on your cardiac medications before your next appointment, please call your pharmacy*   Lab Work:  TODAY!!!!! CMET/MAG/A1C  If you have labs (blood work) drawn today and your tests are completely normal, you will receive your results only by: MyChart Message (if you have MyChart) OR A paper copy in the mail If you have any lab test that is abnormal or we need to change your treatment, we will call you to review the results.   Testing/Procedures:  None ordered.   Follow-Up: At [facility], you and your health needs are our priority.  As part of our continuing mission to provide you with exceptional heart care, we have created designated  Provider Care Teams.  These Care Teams include your primary Cardiologist (physician) and Advanced Practice Providers (APPs -  Physician Assistants and Nurse Practitioners) who all work together to provide you with the care you need, when you need it.  We recommend signing up for the patient portal called "MyChart".  Sign up information is provided on this After Visit Summary.  MyChart is used to connect with patients for Virtual Visits (Telemedicine).  Patients are able to view lab/test results, encounter notes, upcoming appointments, etc.  Non-urgent messages can be sent to your provider as well.   To learn more about what you can do with MyChart, go to [email or web address].    Your next appointment:   1 year(s)  Provider:   Thomasene Ripple, DO     Other Instructions  Your physician wants you to follow-up in: 1 year with Dr. Servando Salina.  You will receive a reminder letter in the mail two months in advance. If you don't receive a letter, please call our office to schedule the follow-up appointment.     Adopting a Healthy Lifestyle.  Know what a healthy weight is for you (roughly BMI <25) and aim to maintain this   Aim for 7+ servings of fruits and vegetables daily   65-80+ fluid ounces of water or unsweet tea for healthy kidneys   Limit to max 1 drink of alcohol per day; avoid smoking/tobacco   Limit animal fats in diet for cholesterol and heart health - choose grass fed whenever available   Avoid highly processed foods, and foods high in saturated/trans fats   Aim for low stress - take time to unwind and care for your mental health   Aim for 150 min of moderate  intensity exercise weekly for heart health, and weights twice weekly for bone health   Aim for 7-9 hours of sleep daily   When it comes to diets, agreement about the perfect plan isnt easy to find, even among the experts. Experts at the [facility] [employer] developed an idea known as the Healthy Eating Plate. Just  imagine a plate divided into logical, healthy portions.   The emphasis is on diet quality:   Load up on vegetables and fruits - one-half of your plate: Aim for color and variety, and remember that potatoes dont count.   Go for whole grains - one-quarter of your plate: Whole wheat, barley, wheat berries, quinoa, oats, brown rice, and foods made with them. If you want pasta, go with whole wheat pasta.   Protein power - one-quarter of your plate: Fish, chicken, beans, and nuts are all healthy, versatile protein sources. Limit red meat.   The diet, however, does go beyond the plate, offering a few other suggestions.   Use healthy plant oils, such as olive, canola, soy, corn, sunflower and peanut. Check the labels, and avoid partially hydrogenated oil, which have unhealthy trans fats.   If youre thirsty, drink water. Coffee and tea are good in moderation, but skip sugary drinks and limit milk and dairy products to one or two daily servings.   The type of carbohydrate in the diet is more important than the amount. Some sources of carbohydrates, such as vegetables, fruits, whole grains, and beans-are healthier than others.   Finally, stay active  Signed, Thomasene Ripple, DO  04/17/2023 7:39 PM    [facility]

## 2023-04-18 ENCOUNTER — Telehealth: Payer: Self-pay | Admitting: Licensed Clinical Social Worker

## 2023-04-18 NOTE — Telephone Encounter (Signed)
H&V Care Navigation CSW Progress Note  Clinical Social Worker contacted patient by phone to f/u on request for medication affordability assistance. Pt expressed concern to provider that she would be in "donut hole" next month. I was unable to reach pt, left voicemail at [phone], will re-attempt as able.   Patient is participating in a Managed Medicaid Plan:  No, UHC Medicare  SDOH Screenings   Food Insecurity: No Food Insecurity (09/23/2022)  Housing: Low Risk  (09/23/2022)  Transportation Needs: No Transportation Needs (09/23/2022)  Utilities: Not At Risk (09/23/2022)  Alcohol Screen: Low Risk  (09/23/2022)  Depression (PHQ2-9): Low Risk  (10/27/2022)  Financial Resource Strain: Low Risk  (09/23/2022)  Physical Activity: Insufficiently Active (09/23/2022)  Social Connections: Moderately Integrated (09/23/2022)  Stress: No Stress Concern Present (09/23/2022)  Tobacco Use: Low Risk  (04/14/2023)   Octavio Graves, MSW, LCSW Clinical Social Worker II [facility] Heart/Vascular Care Navigation  [phone]- work cell phone (preferred) [phone]- desk phone

## 2023-04-19 NOTE — Progress Notes (Signed)
Heart and Vascular Care Navigation  04/18/2023  Evaly Ginger [facility] 1951/02/15 [id number]  Reason for Referral: medication affordability Patient is participating in a Managed Medicaid Plan: No, UHC Medicare only  Engaged with patient by telephone for initial visit for Heart and Vascular Care Coordination.                                                                                                   Assessment:                                     LCSW received a call back from pt this afternoon, she confirmed home address, PCP and current insurance. She resides with her husband. She shares that they both receive Social Security and she receives a pension. She is concerned about the cost of her prescriptions going in to the "donut hole." Discussed pt could apply for [employer] Patient Assistance program for her Repatha and could also utilize the [employer] to apply for [employer] Plan and Extra Help program (although cautioned with provided income is likely over income for those programs). LCSW will mail these to pt. She should return them to office for provider to complete their portion. LCSW will f/u with pt to answer any questions. Otherwise, no additional financial challenges noted at this time.   HRT/VAS Care Coordination     Patients Home Cardiology Office [facility]   Outpatient Care Team Social Worker   Social Worker Name: Octavio Graves, [place], [phone]   Living arrangements for the past 2 months Single Family Home   Lives with: Spouse   Patient Current Insurance Coverage Managed Medicare   Patient Has Concern With Paying Medical Bills No   Does Patient Have Prescription Coverage? Yes   Home Assistive Devices/Equipment Eyeglasses       Social History:                                                                             SDOH Screenings   Food Insecurity: No Food Insecurity (04/19/2023)  Housing: Low Risk  (04/19/2023)  Transportation Needs: No Transportation  Needs (09/23/2022)  Utilities: Not At Risk (04/19/2023)  Alcohol Screen: Low Risk  (09/23/2022)  Depression (PHQ2-9): Low Risk  (10/27/2022)  Financial Resource Strain: Low Risk  (04/19/2023)  Physical Activity: Insufficiently Active (09/23/2022)  Social Connections: Moderately Integrated (09/23/2022)  Stress: No Stress Concern Present (09/23/2022)  Tobacco Use: Low Risk  (04/14/2023)    SDOH Interventions: Financial Resources:  Financial Strain Interventions: Other (Comment) (mailed Amgen patient assistance for her Repatha)  Food Insecurity:  Food Insecurity Interventions: Intervention Not Indicated  Housing Insecurity:  Housing Interventions: Intervention Not Indicated  Transportation:    Transportation Interventions: Intervention Not Indicated  Other Care Navigation Interventions:     Provided Pharmacy assistance resources  Mailed Amgen Patient Assistance application, mailed Medicare Savings program and Extra Help info along with [facility] contact   Follow-up plan:   LCSW mailed pt Amgen patient assistance for Repatha. Also mailed pt information about Medicare Savings and Medicare Extra Help program. I will route this to Dr. Barnetta Hammersmith RN.

## 2023-04-25 ENCOUNTER — Telehealth: Payer: Self-pay | Admitting: Cardiology

## 2023-04-25 ENCOUNTER — Telehealth: Payer: Self-pay | Admitting: Licensed Clinical Social Worker

## 2023-04-25 NOTE — Telephone Encounter (Signed)
Will forward to provider and nurse for [facility]

## 2023-04-25 NOTE — Telephone Encounter (Signed)
H&V Care Navigation CSW Progress Note  Clinical Social Worker contacted patient by phone to f/u on Amgen patient assistance. Was able to reach pt this afternoon at [phone]. She shares that they do not meet the asset requirement for Amgen or ExtraHelp. She is okay with cost of medication at this time. Encouraged her to let our team know if we can be of any additional assistance and I confirmed her earlier call had been routed to Dr. Barnetta Hammersmith team.   Patient is participating in a Managed Medicaid Plan:  No, [employer]  SDOH Screenings   Food Insecurity: No Food Insecurity (04/19/2023)  Housing: Low Risk  (04/19/2023)  Transportation Needs: No Transportation Needs (09/23/2022)  Utilities: Not At Risk (04/19/2023)  Alcohol Screen: Low Risk  (09/23/2022)  Depression (PHQ2-9): Low Risk  (10/27/2022)  Financial Resource Strain: Low Risk  (04/19/2023)  Physical Activity: Insufficiently Active (09/23/2022)  Social Connections: Moderately Integrated (09/23/2022)  Stress: No Stress Concern Present (09/23/2022)  Tobacco Use: Low Risk  (04/14/2023)    Octavio Graves, MSW, LCSW Clinical Social Worker II [facility] Heart/Vascular Care Navigation  [phone]- work cell phone (preferred) [phone]- desk phone

## 2023-04-25 NOTE — Telephone Encounter (Signed)
  Pt c/o medication issue:  1. Name of Medication: Evolocumab (REPATHA SURECLICK) 140 MG/ML SOAJ   2. How are you currently taking this medication (dosage and times per day)? Inject 140 mg into the skin every 14 (fourteen) days.   3. Are you having a reaction (difficulty breathing--STAT)? No   4. What is your medication issue?  The patient mentioned that she reviewed the paperwork for patient assistance with her Repatha and believes that, based on the criteria, she will not qualify. Therefore, she will not be bringing the form anymore. She also checked with her insurance and found that, once she is out of the donut hole, the cost for her Repatha will be $50, which she finds acceptable

## 2023-05-11 ENCOUNTER — Telehealth: Payer: Self-pay

## 2023-05-11 NOTE — Patient Outreach (Signed)
  Care Coordination   Initial Visit Note   05/11/2023 Name: [name] MRN: [id number] DOB: September 02, 1950  [name] is a 72 y.o. year old female who sees [name], Rose Gallus, MD for primary care. I spoke with  [name] by phone today.  What matters to the patients health and wellness today?  Patient denies having any nursing and or community resource need at this time.       Goals Addressed             This Visit's Progress    COMPLETED: Care coordination activities - no follow up needed       Interventions Today    Flowsheet Row Most Recent Value  General Interventions   General Interventions Discussed/Reviewed General Interventions Discussed, Labs, Doctor Visits  [Care coordination services discussed. SDOH survey completed. AWV discussed and patient advised to contact provider office to schedule. Discussed vaccines. Advised to contact PCP office if care coordination services needed in the future.]  Labs Hgb A1c annually  Pharmacy Interventions   Pharmacy Dicussed/Reviewed Pharmacy Topics Discussed              SDOH assessments and interventions completed:  No     Care Coordination Interventions:  Yes, provided   Follow up plan: No further intervention required.   Encounter Outcome:  Patient Visit Completed   [name] [facility] Care Coordination [phone] direct line

## 2023-06-25 ENCOUNTER — Other Ambulatory Visit: Payer: Self-pay | Admitting: Family Medicine

## 2023-07-12 ENCOUNTER — Telehealth: Payer: Self-pay | Admitting: Cardiology

## 2023-07-12 NOTE — Telephone Encounter (Signed)
Pt c/o medication issue:  1. Name of Medication:   Evolocumab (REPATHA SURECLICK) 140 MG/ML SOAJ    2. How are you currently taking this medication (dosage and times per day)?    3. Are you having a reaction (difficulty breathing--STAT)? no  4. What is your medication issue? Calling to see if our office have any samples. Patient is in the donut whole. Please advise

## 2023-07-14 ENCOUNTER — Encounter: Payer: Self-pay | Admitting: Pharmacist

## 2023-07-14 NOTE — Telephone Encounter (Signed)
Spoke to patient. Enrolled her into the [employer] grant.

## 2023-07-15 ENCOUNTER — Encounter: Payer: Self-pay | Admitting: Pharmacist

## 2023-07-15 NOTE — Progress Notes (Signed)
Pharmacy Quality Measure Review  This patient is appearing on report for being at risk of failing the measure for Statin Therapy for Patients with Cardiovascular Disease [facility]) medications this calendar year.   Prior trials of: Rosuvastatin with documented weakness/mscle aches in allergy list.   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   Upon patient evaluation, if deemed appropriate, consider documentation of muscle code at next PCP visit to remove patient from [facility] measure.      Future Appointments  Date Time Provider Department Center  07/25/2023  9:15 AM [facility] LAB [facility]  08/01/2023  2:30 PM Tower, Audrie Gallus, MD [facility]  09/27/2023 10:15 AM [facility] ANNUAL WELLNESS VISIT 1 [facility]   Loree Fee, PharmD Clinical Pharmacist [facility] [phone]

## 2023-07-21 ENCOUNTER — Other Ambulatory Visit: Payer: Self-pay | Admitting: Family Medicine

## 2023-07-22 NOTE — Telephone Encounter (Signed)
Name of Medication: Xanax Name of Pharmacy: [facility] Last Fill or Written Date and Quantity: 03/24/23 #15 tab/ 3 refill Last Office Visit and Type: f/u on 10/27/22 Next Office Visit and Type: CPE 08/01/23

## 2023-07-24 ENCOUNTER — Telehealth: Payer: Self-pay | Admitting: Family Medicine

## 2023-07-24 DIAGNOSIS — R7303 Prediabetes: Secondary | ICD-10-CM

## 2023-07-24 DIAGNOSIS — Z0000 Encounter for general adult medical examination without abnormal findings: Secondary | ICD-10-CM

## 2023-07-24 DIAGNOSIS — E038 Other specified hypothyroidism: Secondary | ICD-10-CM

## 2023-07-24 DIAGNOSIS — E7800 Pure hypercholesterolemia, unspecified: Secondary | ICD-10-CM

## 2023-07-24 DIAGNOSIS — Z79899 Other long term (current) drug therapy: Secondary | ICD-10-CM

## 2023-07-24 NOTE — Telephone Encounter (Signed)
-----   Message from [place] sent at 07/08/2023  1:25 PM EST ----- Regarding: Lab Mon 07/25/23 Hello,  Patient is coming in for CPE labs on Monday 07/25/23. Can we get orders please.   Thanks

## 2023-07-25 ENCOUNTER — Other Ambulatory Visit (INDEPENDENT_AMBULATORY_CARE_PROVIDER_SITE_OTHER): Payer: Medicare Other

## 2023-07-25 DIAGNOSIS — E7800 Pure hypercholesterolemia, unspecified: Secondary | ICD-10-CM | POA: Diagnosis not present

## 2023-07-25 DIAGNOSIS — E038 Other specified hypothyroidism: Secondary | ICD-10-CM

## 2023-07-25 DIAGNOSIS — R7303 Prediabetes: Secondary | ICD-10-CM | POA: Diagnosis not present

## 2023-07-25 DIAGNOSIS — Z79899 Other long term (current) drug therapy: Secondary | ICD-10-CM

## 2023-07-25 LAB — LIPID PANEL
Cholesterol: 158 mg/dL (ref 0–200)
HDL: 64.9 mg/dL (ref >39.00)
LDL Cholesterol: 57 mg/dL (ref 0–99)
NonHDL: 93.57
Total CHOL/HDL Ratio: 2
Triglycerides: 182 mg/dL — ABNORMAL HIGH (ref 0.0–149.0)
VLDL: 36.4 mg/dL (ref 0.0–40.0)

## 2023-07-25 LAB — T4, FREE: Free T4: 0.79 ng/dL (ref 0.60–1.60)

## 2023-07-25 LAB — COMPREHENSIVE METABOLIC PANEL
ALT: 22 U/L (ref 0–35)
AST: 17 U/L (ref 0–37)
Albumin: 4.4 g/dL (ref 3.5–5.2)
Alkaline Phosphatase: 67 U/L (ref 39–117)
BUN: 21 mg/dL (ref 6–23)
CO2: 27 meq/L (ref 19–32)
Calcium: 9.6 mg/dL (ref 8.4–10.5)
Chloride: 104 meq/L (ref 96–112)
Creatinine, Ser: 0.65 mg/dL (ref 0.40–1.20)
GFR: 87.08 mL/min (ref >60.00)
Glucose, Bld: 109 mg/dL — ABNORMAL HIGH (ref 70–99)
Potassium: 4.2 meq/L (ref 3.5–5.1)
Sodium: 140 meq/L (ref 135–145)
Total Bilirubin: 0.9 mg/dL (ref 0.2–1.2)
Total Protein: 7 g/dL (ref 6.0–8.3)

## 2023-07-25 LAB — CBC WITH DIFFERENTIAL/PLATELET
Basophils Absolute: 0 10*3/uL (ref 0.0–0.1)
Basophils Relative: 0.4 % (ref 0.0–3.0)
Eosinophils Absolute: 0.2 10*3/uL (ref 0.0–0.7)
Eosinophils Relative: 2.7 % (ref 0.0–5.0)
HCT: 37.2 % (ref 36.0–46.0)
Hemoglobin: 11.8 g/dL — ABNORMAL LOW (ref 12.0–15.0)
Lymphocytes Relative: 41.3 % (ref 12.0–46.0)
Lymphs Abs: 2.5 10*3/uL (ref 0.7–4.0)
MCHC: 31.7 g/dL (ref 30.0–36.0)
MCV: 78.1 fL (ref 78.0–100.0)
Monocytes Absolute: 0.6 10*3/uL (ref 0.1–1.0)
Monocytes Relative: 9.9 % (ref 3.0–12.0)
Neutro Abs: 2.8 10*3/uL (ref 1.4–7.7)
Neutrophils Relative %: 45.7 % (ref 43.0–77.0)
Platelets: 290 10*3/uL (ref 150.0–400.0)
RBC: 4.77 Mil/uL (ref 3.87–5.11)
RDW: 16.4 % — ABNORMAL HIGH (ref 11.5–15.5)
WBC: 6.1 10*3/uL (ref 4.0–10.5)

## 2023-07-25 LAB — TSH: TSH: 2.88 u[IU]/mL (ref 0.35–5.50)

## 2023-07-25 LAB — VITAMIN B12: Vitamin B-12: 297 pg/mL (ref 211–911)

## 2023-07-25 LAB — VITAMIN D 25 HYDROXY (VIT D DEFICIENCY, FRACTURES): VITD: 24.79 ng/mL — ABNORMAL LOW (ref 30.00–100.00)

## 2023-07-25 LAB — HEMOGLOBIN A1C: Hgb A1c MFr Bld: 6.8 % — ABNORMAL HIGH (ref 4.6–6.5)

## 2023-08-01 ENCOUNTER — Ambulatory Visit (INDEPENDENT_AMBULATORY_CARE_PROVIDER_SITE_OTHER): Payer: Medicare Other | Admitting: Family Medicine

## 2023-08-01 ENCOUNTER — Encounter: Payer: Self-pay | Admitting: Family Medicine

## 2023-08-01 VITALS — BP 118/68 | HR 67 | Temp 98.0°F | Ht 63.0 in | Wt 171.2 lb

## 2023-08-01 DIAGNOSIS — K219 Gastro-esophageal reflux disease without esophagitis: Secondary | ICD-10-CM

## 2023-08-01 DIAGNOSIS — E2839 Other primary ovarian failure: Secondary | ICD-10-CM

## 2023-08-01 DIAGNOSIS — Z1211 Encounter for screening for malignant neoplasm of colon: Secondary | ICD-10-CM

## 2023-08-01 DIAGNOSIS — R7303 Prediabetes: Secondary | ICD-10-CM

## 2023-08-01 DIAGNOSIS — Z79899 Other long term (current) drug therapy: Secondary | ICD-10-CM

## 2023-08-01 DIAGNOSIS — Z0000 Encounter for general adult medical examination without abnormal findings: Secondary | ICD-10-CM

## 2023-08-01 DIAGNOSIS — Z888 Allergy status to other drugs, medicaments and biological substances status: Secondary | ICD-10-CM

## 2023-08-01 DIAGNOSIS — K760 Fatty (change of) liver, not elsewhere classified: Secondary | ICD-10-CM | POA: Diagnosis not present

## 2023-08-01 DIAGNOSIS — E7800 Pure hypercholesterolemia, unspecified: Secondary | ICD-10-CM | POA: Diagnosis not present

## 2023-08-01 DIAGNOSIS — Z7984 Long term (current) use of oral hypoglycemic drugs: Secondary | ICD-10-CM | POA: Diagnosis not present

## 2023-08-01 DIAGNOSIS — E119 Type 2 diabetes mellitus without complications: Secondary | ICD-10-CM | POA: Diagnosis not present

## 2023-08-01 DIAGNOSIS — E038 Other specified hypothyroidism: Secondary | ICD-10-CM

## 2023-08-01 DIAGNOSIS — F5104 Psychophysiologic insomnia: Secondary | ICD-10-CM

## 2023-08-01 MED ORDER — CYCLOBENZAPRINE HCL 10 MG PO TABS: 5.0000 mg | ORAL_TABLET | Freq: Every evening | ORAL | 1 refills | Status: DC | PRN

## 2023-08-01 NOTE — Assessment & Plan Note (Signed)
Lab Results  Component Value Date   VITAMINB12 297 07/25/2023   Encouraged 500 mcg daily of B12 for low normal level

## 2023-08-01 NOTE — Assessment & Plan Note (Signed)
Protonix 40 mg bid  Has upcoming follow up with GI  Encouraged to watch diet

## 2023-08-01 NOTE — Assessment & Plan Note (Signed)
Last colonoscoy 10 y ago this month  Pt had GI appointment upcoming to discuss and schedule Mild anemia as well  Neg cologuard 06/2021

## 2023-08-01 NOTE — Patient Instructions (Addendum)
Add more vitamin D  Vitamin d3   2000 international units daily - to what you are already taking   Consider getting a vitamin B12 supplement 500 mcg -take daily   Follow up with GI to discuss mild anemia and colon cancer screening    Stay active  Add some strength training to your routine, this is important for bone and brain health and can reduce your risk of falls and help your body use insulin properly and regulate weight  Light weights, exercise bands , and internet videos are a good way to start  Yoga (chair or regular), machines , floor exercises or a gym with machines are also good options    Your glucose is in the diabetic range Try to get most of your carbohydrates from produce (with the exception of white potatoes) and whole grains Eat less bread/pasta/rice/snack foods/cereals/sweets and other items from the middle of the grocery store (processed carbs)  Follow up in 3 months   Stop at check out to make an appointment with Dr Patsy Lager    Call and schedule a bone density test You have an order for:  []   2D Mammogram  []   3D Mammogram  [x]   Bone Density     Please call for appointment:   []   [facility]  [address] [place] [address]  [phone]  []   [facility])   [address]  [place], [place] [address]  [phone]  []   [facility]      [address] [place], [place]        [phone]         []   [facility]  [address] [place], [place]  [phone]  [x]  [facility]   [address]   [place], [place] [address]  [phone]  []  [facility]  [address] [place], [place] [address] [phone]    Make sure to wear two piece clothing  No lotions powders or deodorants the day of the  appointment Make sure to bring picture ID and insurance card.  Bring list of medications you are currently taking including any supplements.   Schedule your screening mammogram through MyChart!   Select [place] imaging sites can now be scheduled through MyChart.  Log into your MyChart account.  Go to 'Visit' (or 'Appointments' if  on mobile App) --> Schedule an  Appointment  Under 'Select a Reason for Visit' choose the Mammogram  Screening option.  Complete the pre-visit questions  and select the time and place that  best fits your schedule

## 2023-08-01 NOTE — Progress Notes (Signed)
Subjective:    Patient ID: [name], female    DOB: [birth year], 72 y.o.   MRN: [id number]  HPI  Here for health maintenance exam and to review chronic medical problems   Wt Readings from Last 3 Encounters:  08/01/23 171 lb 4 oz (77.7 kg)  04/14/23 164 lb 12.8 oz (74.8 kg)  10/27/22 163 lb 4 oz (74 kg)   30.34 kg/m  Vitals:   08/01/23 1421  BP: 118/68  Pulse: 67  Temp: 98 F (36.7 C)  SpO2: 98%    Immunization History  Administered Date(s) Administered   Fluad Quad(high Dose 65+) 06/18/2019, 06/12/2020   Fluad Trivalent(High Dose 65+) 06/13/2023   Influenza Whole 05/30/2008   Influenza, High Dose Seasonal PF 06/03/2021   Influenza,inj,Quad PF,6+ Mos 05/03/2014, 06/06/2015, 05/10/2016, 05/26/2017, 06/09/2018   Influenza-Unspecified 05/24/2022   Moderna SARS-COV2 Booster Vaccination 12/03/2020   PFIZER(Purple Top)SARS-COV-2 Vaccination 10/05/2019, 10/30/2019, 06/05/2020   Pfizer Covid-19 Vaccine Bivalent Booster 10yrs & up 06/03/2021   Pfizer(Comirnaty)Fall Seasonal Vaccine 12 years and older 06/04/2022, 06/13/2023   Pneumococcal Conjugate-13 01/29/2015   Pneumococcal Polysaccharide-23 05/10/2016   Td 08/30/2002, 04/27/2019   Tdap 05/03/2014   Zoster Recombinant(Shingrix) 07/29/2021   Zoster, Live 09/07/2012    Health Maintenance Due  Topic Date Due   Diabetic kidney evaluation - Urine ACR  Never done   FOOT EXAM  07/11/2021   Colonoscopy  08/11/2023   Medicare Annual Wellness (AWV)  09/24/2023     Mammogram will be due in April 2025  Has 10 % breast tissues  Self breast exam- no lumps   Gyn health No issues  Dryness/not new    Colon cancer screening  Colonoscopy 07/2013-is due this month  Dr Kenna Gilbert office called her to set this up/has appointment with her to discuss  Negative cologuard 06/2021   Bone health  Dexa  normal 05/2018  Falls- none  Fractures-none  Supplements  Last vitamin D Lab Results  Component Value Date   VD25OH 24.79  (L) 07/25/2023    Exercise :  Has back and outer hip pain that bother her  Is active  Does all the housework and outdoor work      Mood    08/01/2023    2:27 PM 10/27/2022    9:28 AM 09/23/2022   10:01 AM 07/26/2022   11:16 AM 07/26/2022   10:28 AM  Depression screen PHQ 2/9  Decreased Interest 1 0 0 0 0  Down, Depressed, Hopeless 1 0 0 0 0  PHQ - 2 Score 2 0 0 0 0  Altered sleeping 3 3 0  2  Tired, decreased energy 1 0 0  0  Change in appetite 1 0 0  0  Feeling bad or failure about yourself  1 0 0  2  Trouble concentrating 0 0 0  0  Moving slowly or fidgety/restless 0 0 0  0  Suicidal thoughts 0 0 0  0  PHQ-9 Score 8 3 0  4  Difficult doing work/chores Not difficult at all Not difficult at all Not difficult at all  Not difficult at all   Struggling with mood  Is overwhelmed after husband had back injury - she has to do all the work in the house  Life in general is hard   Declines counseling at this time  If worse may consider    History of fatty liver  Lab Results  Component Value Date   ALT 22 07/25/2023   AST 17 07/25/2023  ALKPHOS 67 07/25/2023   BILITOT 0.9 07/25/2023    Lab Results  Component Value Date   NA 140 07/25/2023   K 4.2 07/25/2023   CO2 27 07/25/2023   GLUCOSE 109 (H) 07/25/2023   BUN 21 07/25/2023   CREATININE 0.65 07/25/2023   CALCIUM 9.6 07/25/2023   GFR 87.08 07/25/2023   EGFR 93 04/14/2023   GFRNONAA >60 09/21/2021    Takes protonix Lab Results  Component Value Date   VITAMINB12 297 07/25/2023    Subclinical hypothyroid  Lab Results  Component Value Date   TSH 2.88 07/25/2023   Normal today  Hyperlipidemia Lab Results  Component Value Date   CHOL 158 07/25/2023   CHOL 153 07/26/2022   CHOL 188 08/01/2021   Lab Results  Component Value Date   HDL 64.90 07/25/2023   HDL 73.30 07/26/2022   HDL 69 08/01/2021   Lab Results  Component Value Date   LDLCALC 57 07/25/2023   LDLCALC 50 07/26/2022   LDLCALC 104 (H)  08/01/2021   Lab Results  Component Value Date   TRIG 182.0 (H) 07/25/2023   TRIG 147.0 07/26/2022   TRIG 73 08/01/2021   Lab Results  Component Value Date   CHOLHDL 2 07/25/2023   CHOLHDL 2 07/26/2022   CHOLHDL 2.7 08/01/2021   Lab Results  Component Value Date   LDLDIRECT 97.0 06/06/2018   LDLDIRECT 119.0 05/24/2013   LDLDIRECT 117.3 02/24/2012  With mild CAD  Taking repatha  Unable to take statin due to allergy  Zetia caused myalgias   Prediabetes Lab Results  Component Value Date   HGBA1C 6.8 (H) 07/25/2023   No time for self care lately  Larey Seat off the wagon - diet wise  This is up from 6.3    Lab Results  Component Value Date   WBC 6.1 07/25/2023   HGB 11.8 (L) 07/25/2023   HCT 37.2 07/25/2023   MCV 78.1 07/25/2023   PLT 290.0 07/25/2023  Was turned away from giving blood   Wants a refill of flexeril for back/hip pain     Patient Active Problem List   Diagnosis Date Noted   Minimal CAD 04/05/2022   Fatty liver 09/11/2021   Right upper quadrant abdominal pain 09/11/2021   Insomnia 09/11/2021   Hyperlipidemia 08/06/2021   Vaccine reaction 08/06/2021   GAD (generalized anxiety disorder) 08/01/2021   Current use of proton pump inhibitor 07/20/2021   Colon cancer screening 07/20/2021   Hot flashes 07/11/2020   Subclinical hypothyroidism 07/11/2020   Allergy to statin medication 07/11/2020   Medicare annual wellness visit, subsequent 07/10/2019   Paresthesia 04/06/2019   Foot pain, bilateral 04/06/2019   Vaginal atrophy 06/09/2018   Breast cancer screening 05/31/2017   Controlled type 2 diabetes mellitus without complication, without long-term current use of insulin (HCC) 06/02/2016   Estrogen deficiency 01/29/2015   Routine general medical examination at a health care facility 02/20/2012   GERD 06/20/2008   Past Medical History:  Diagnosis Date   Allergy    Anemia    Anxiety    Arthritis    BBB (bundle branch block)    Blood transfusion  without reported diagnosis    As a baby   Cataract    Complication of anesthesia    Depression    Diabetes mellitus without complication (HCC)    Dyspnea    Fatty liver    Fibrocystic disease of both breasts    GERD (gastroesophageal reflux disease)    Heart murmur 07-02-2021  Nurse practitioner found it on home visit   Hyperglycemia    PONV (postoperative nausea and vomiting)    Pre-diabetes    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   On continue HRT since 1980 per patient   Sphincter of Oddi dysfunction    Past Surgical History:  Procedure Laterality Date   ABDOMINAL HYSTERECTOMY     APPENDECTOMY  1990   BREAST SURGERY  4098,1191, 1989   Multiple breat surgeries over the years   CARPAL TUNNEL RELEASE  08/2008   left   CHOLECYSTECTOMY     COSMETIC SURGERY  1989 & 2012   Subq mastectomies and incapacitation of implants   ERCP     HEEL SPUR RESECTION Left 09/29/2021   Procedure: LEFT HEEL SPUR EXCISION AND ACHILLES TENDON REPAIR;  Surgeon: Marcene Corning, MD;  Location: [facility];  Service: Orthopedics;  Laterality: Left;   LEFT HEART CATHETERIZATION WITH CORONARY ANGIOGRAM N/A 06/08/2013   Procedure: LEFT HEART CATHETERIZATION WITH CORONARY ANGIOGRAM;  Surgeon: Alvia Grove, MD;  Location: [facility] CATH LAB;  Service: Cardiovascular;  Laterality: N/A;   MASTECTOMY     bilateral for severe macrocystic breast   SPINE SURGERY N/A 02/12/2020   neck   TUBAL LIGATION     Social History   Tobacco Use   Smoking status: Never   Smokeless tobacco: Never  Vaping Use   Vaping status: Never Used  Substance Use Topics   Alcohol use: Yes    Alcohol/week: 1.0 standard drink of alcohol    Types: 1 Shots of liquor per week    Comment: More like once a month if that   Drug use: No   Family History  Problem Relation Age of Onset   Brain cancer Mother        hospice   Diabetes Mother    Hypertension Mother    Varicose Veins Mother    Diabetes Father    Hypertension  Father    Cancer Father        prostate and leukemia   Hearing loss Father    Breast cancer Sister 22   Cancer Sister    Cancer Maternal Aunt        Pancreatic   Diabetes Maternal Uncle    Heart disease Maternal Uncle    Heart disease Maternal Grandmother    Hyperlipidemia Maternal Grandmother    Stroke Maternal Grandmother    Obesity Maternal Grandfather    Cancer Sister    Allergies  Allergen Reactions   Atorvastatin Other (See Comments)    REACTION: rash   Ezetimibe Other (See Comments)    REACTION: muscle aches   Polyoxyethylene 40 Sorbitol Septaoleate [Sorbitan] Other (See Comments)   Prednisone Other (See Comments)    **INJECTIONS ONLY** caused tachycardia    Rosuvastatin Other (See Comments)    Fatigue and weakness   Shingrix [Zoster Vac Recomb Adjuvanted]     Reaction ?  Weakness on one side    Sulfamethoxazole-Trimethoprim Other (See Comments)    Swelling of nostrils   Current Outpatient Medications on File Prior to Visit  Medication Sig Dispense Refill   acetaminophen (TYLENOL) 500 MG tablet Take 1,000 mg by mouth at bedtime.     ALPRAZolam (XANAX) 1 MG tablet TAKE 1/2 TABLET BY MOUTH AT BEDTIME AS NEEDED FOR ANXIETY OR SLEEP 15 tablet 3   ASPERCREME LIDOCAINE EX Apply 1 application  topically at bedtime as needed (foot pain).     aspirin EC 81 MG tablet Take 81  mg by mouth daily. Swallow whole.     Calcium-Magnesium-Vitamin D (CALCIUM 1200+D3 PO) Take 1 tablet by mouth at bedtime.     diclofenac Sodium (VOLTAREN) 1 % GEL APPLY 4 GRAMS TO AFFECTED AREA 4 TIMES DAILY AS NEEDED 200 g 11   Evolocumab (REPATHA SURECLICK) 140 MG/ML SOAJ Inject 140 mg into the skin every 14 (fourteen) days. 2 mL 12   Melatonin 10 MG CAPS Take 10 mg by mouth at bedtime.     metoprolol succinate (TOPROL-XL) 25 MG 24 hr tablet Take 0.5 tablets (12.5 mg total) by mouth daily. 45 tablet 3   Multiple Vitamins-Minerals (EYE VITAMINS PO) Take 1 capsule by mouth daily.     pantoprazole  (PROTONIX) 40 MG tablet TAKE 1 TABLET BY MOUTH TWICE  DAILY 90 tablet 2   triamcinolone cream (KENALOG) 0.1 % APPLY 1 APPLICATION TOPICALLY 2 (TWO) TIMES DAILY AS NEEDED. RASH 30 g 3   No current facility-administered medications on file prior to visit.    Review of Systems  Constitutional:  Positive for fatigue. Negative for activity change, appetite change, fever and unexpected weight change.  HENT:  Negative for congestion, ear pain, rhinorrhea, sinus pressure and sore throat.   Eyes:  Negative for pain, redness and visual disturbance.  Respiratory:  Negative for cough, shortness of breath and wheezing.   Cardiovascular:  Negative for chest pain and palpitations.  Gastrointestinal:  Negative for abdominal pain, blood in stool, constipation and diarrhea.  Endocrine: Negative for polydipsia and polyuria.  Genitourinary:  Negative for dysuria, frequency and urgency.  Musculoskeletal:  Negative for arthralgias, back pain and myalgias.  Skin:  Negative for pallor and rash.  Allergic/Immunologic: Negative for environmental allergies.  Neurological:  Negative for dizziness, syncope and headaches.  Hematological:  Negative for adenopathy. Does not bruise/bleed easily.  Psychiatric/Behavioral:  Positive for dysphoric mood. Negative for decreased concentration. The patient is not nervous/anxious.        Objective:   Physical Exam Constitutional:      General: She is not in acute distress.    Appearance: Normal appearance. She is well-developed. She is obese. She is not ill-appearing or diaphoretic.  HENT:     Head: Normocephalic and atraumatic.     Right Ear: Tympanic membrane, ear canal and external ear normal.     Left Ear: Tympanic membrane, ear canal and external ear normal.     Nose: Nose normal. No congestion.     Mouth/Throat:     Mouth: Mucous membranes are moist.     Pharynx: Oropharynx is clear. No posterior oropharyngeal erythema.  Eyes:     General: No scleral icterus.     Extraocular Movements: Extraocular movements intact.     Conjunctiva/sclera: Conjunctivae normal.     Pupils: Pupils are equal, round, and reactive to light.  Neck:     Thyroid: No thyromegaly.     Vascular: No carotid bruit or JVD.  Cardiovascular:     Rate and Rhythm: Normal rate and regular rhythm.     Pulses: Normal pulses.     Heart sounds: Normal heart sounds.     No gallop.  Pulmonary:     Effort: Pulmonary effort is normal. No respiratory distress.     Breath sounds: Normal breath sounds. No wheezing.     Comments: Good air exch Chest:     Chest wall: No tenderness.  Abdominal:     General: Bowel sounds are normal. There is no distension or abdominal bruit.  Palpations: Abdomen is soft. There is no mass.     Tenderness: There is no abdominal tenderness.     Hernia: No hernia is present.  Genitourinary:    Comments: Declines breast exam due to low breast tissue  Musculoskeletal:        General: No tenderness. Normal range of motion.     Cervical back: Normal range of motion and neck supple. No rigidity. No muscular tenderness.     Right lower leg: No edema.     Left lower leg: No edema.     Comments: No kyphosis   Lymphadenopathy:     Cervical: No cervical adenopathy.  Skin:    General: Skin is warm and dry.     Coloration: Skin is not pale.     Findings: No erythema or rash.     Comments: Solar lentigines diffusely   Neurological:     Mental Status: She is alert. Mental status is at baseline.     Cranial Nerves: No cranial nerve deficit.     Motor: No abnormal muscle tone.     Coordination: Coordination normal.     Gait: Gait normal.     Deep Tendon Reflexes: Reflexes are normal and symmetric. Reflexes normal.  Psychiatric:        Attention and Perception: Attention normal.        Mood and Affect: Mood is depressed.        Cognition and Memory: Cognition and memory normal.     Comments: Candidly discusses symptoms and stressors              Assessment & Plan:   Problem List Items Addressed This Visit       Digestive   GERD    Protonix 40 mg bid  Has upcoming follow up with GI  Encouraged to watch diet       Fatty liver    Normal LFTs  Encouraged lifestyle change for weight loss         Endocrine   Subclinical hypothyroidism    Lab Results  Component Value Date   TSH 2.88 07/25/2023   No clinical changes       Controlled type 2 diabetes mellitus without complication, without long-term current use of insulin (HCC)    A1c went up into dm range  Lab Results  Component Value Date   HGBA1C 6.8 (H) 07/25/2023   Per pt, not eating well  Caring for husband, no time to exercise  Will follow up 3 mo after better diet and exercise (best she can) Consider metformin at that time  Microalb ordered  Lipids controlled with Repatha (is statin allergic) Will discuss eye exam at follow up       Relevant Orders   Microalbumin / creatinine urine ratio     Other   Routine general medical examination at a health care facility - Primary    Reviewed health habits including diet and exercise and skin cancer prevention Reviewed appropriate screening tests for age  Also reviewed health mt list, fam hx and immunization status , as well as social and family history   See HPI Labs reviewed and ordered Gets mammogram (has 10% of breast tissue), declines exam  GI follow up to discuss screening colonoscopy soon  Dexa ordered  Discussed fall prevention, supplements and exercise for bone density  PHQ 8 , pt declines therapy or treatment / thinks this will improve as her husband becomes more independent from back surgery   Health Maintenance  Topic Date Due   Yearly kidney health urinalysis for diabetes  Never done   Complete foot exam   07/11/2021   Colon Cancer Screening  08/11/2023   Medicare Annual Wellness Visit  09/24/2023   COVID-19 Vaccine (7 - 2023-24 season) 08/08/2023   Eye exam for diabetics  12/14/2023    Mammogram  12/20/2023   Hemoglobin A1C  01/22/2024   Yearly kidney function blood test for diabetes  07/24/2024   DTaP/Tdap/Td vaccine (4 - Td or Tdap) 04/26/2029   Pneumonia Vaccine  Completed   Flu Shot  Completed   DEXA scan (bone density measurement)  Completed   Hepatitis C Screening  Completed   HPV Vaccine  Aged Out   Zoster (Shingles) Vaccine  Discontinued         Insomnia    Uses alprazolam-disc inc risks of this med with age Also has hot flashes May consider trial of ssri in the future        Hyperlipidemia    Disc goals for lipids and reasons to control them Rev last labs with pt Rev low sat fat diet in detail  Taking repatha   LDL controlled at 57   Allergic to statins  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

## 2023-08-01 NOTE — Assessment & Plan Note (Signed)
Reviewed health habits including diet and exercise and skin cancer prevention Reviewed appropriate screening tests for age  Also reviewed health mt list, fam hx and immunization status , as well as social and family history   See HPI Labs reviewed and ordered Gets mammogram (has 10% of breast tissue), declines exam  GI follow up to discuss screening colonoscopy soon  Dexa ordered  Discussed fall prevention, supplements and exercise for bone density  PHQ 8 , pt declines therapy or treatment / thinks this will improve as her husband becomes more independent from back surgery   Health Maintenance  Topic Date Due   Yearly kidney health urinalysis for diabetes  Never done   Complete foot exam   07/11/2021   Colon Cancer Screening  08/11/2023   Medicare Annual Wellness Visit  09/24/2023   COVID-19 Vaccine (7 - 2023-24 season) 08/08/2023   Eye exam for diabetics  12/14/2023   Mammogram  12/20/2023   Hemoglobin A1C  01/22/2024   Yearly kidney function blood test for diabetes  07/24/2024   DTaP/Tdap/Td vaccine (4 - Td or Tdap) 04/26/2029   Pneumonia Vaccine  Completed   Flu Shot  Completed   DEXA scan (bone density measurement)  Completed   Hepatitis C Screening  Completed   HPV Vaccine  Aged Out   Zoster (Shingles) Vaccine  Discontinued

## 2023-08-01 NOTE — Assessment & Plan Note (Signed)
Pt taking repatha for cholesterol now

## 2023-08-01 NOTE — Assessment & Plan Note (Signed)
Normal LFTs  Encouraged lifestyle change for weight loss

## 2023-08-01 NOTE — Assessment & Plan Note (Signed)
Lab Results  Component Value Date   TSH 2.88 07/25/2023   No clinical changes

## 2023-08-01 NOTE — Assessment & Plan Note (Signed)
Uses alprazolam-disc inc risks of this med with age Also has hot flashes May consider trial of ssri in the future

## 2023-08-01 NOTE — Assessment & Plan Note (Signed)
Dexa ordered Pt will call to schedule

## 2023-08-01 NOTE — Assessment & Plan Note (Signed)
A1c went up into dm range  Lab Results  Component Value Date   HGBA1C 6.8 (H) 07/25/2023   Per pt, not eating well  Caring for husband, no time to exercise  Will follow up 3 mo after better diet and exercise (best she can) Consider metformin at that time  Microalb ordered  Lipids controlled with Repatha (is statin allergic) Will discuss eye exam at follow up

## 2023-08-01 NOTE — Assessment & Plan Note (Signed)
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

## 2023-08-02 LAB — MICROALBUMIN / CREATININE URINE RATIO
Creatinine,U: 41.4 mg/dL
Microalb Creat Ratio: 1.7 mg/g (ref 0.0–30.0)
Microalb, Ur: <0.7 mg/dL (ref 0.0–1.9)

## 2023-08-04 ENCOUNTER — Encounter: Payer: Self-pay | Admitting: *Deleted

## 2023-08-15 ENCOUNTER — Ambulatory Visit: Payer: Medicare Other | Admitting: Family Medicine

## 2023-09-03 ENCOUNTER — Other Ambulatory Visit: Payer: Self-pay | Admitting: Family Medicine

## 2023-09-27 ENCOUNTER — Ambulatory Visit (INDEPENDENT_AMBULATORY_CARE_PROVIDER_SITE_OTHER): Payer: Medicare Other

## 2023-09-27 ENCOUNTER — Telehealth: Payer: Self-pay | Admitting: Family Medicine

## 2023-09-27 VITALS — Ht 64.0 in | Wt 171.0 lb

## 2023-09-27 DIAGNOSIS — D649 Anemia, unspecified: Secondary | ICD-10-CM | POA: Insufficient documentation

## 2023-09-27 DIAGNOSIS — Z1231 Encounter for screening mammogram for malignant neoplasm of breast: Secondary | ICD-10-CM

## 2023-09-27 DIAGNOSIS — Z0000 Encounter for general adult medical examination without abnormal findings: Secondary | ICD-10-CM | POA: Diagnosis not present

## 2023-09-27 NOTE — Progress Notes (Signed)
Subjective:   [name] is a 73 y.o. female who presents for Medicare Annual (Subsequent) preventive examination.  Visit Complete: Virtual I connected with  Franchesca Veneziano Abrahamsen on 09/27/23 by a audio enabled telemedicine application and verified that I am speaking with the correct person using two identifiers.  Patient Location: Home  Provider Location: Home Office  I discussed the limitations of evaluation and management by telemedicine. The patient expressed understanding and agreed to proceed.  Vital Signs: Because this visit was a virtual/telehealth visit, some criteria may be missing or patient reported. Any vitals not documented were not able to be obtained and vitals that have been documented are patient reported.  Patient Medicare AWV questionnaire was completed by the patient on 09/23/2023; I have confirmed that all information answered by patient is correct and no changes since this date.  Cardiac Risk Factors include: advanced age (>73men, >62 women);dyslipidemia    Objective:    Today's Vitals   09/23/23 0938 09/27/23 1015  Weight:  171 lb (77.6 kg)  Height:  5\' 4"  (1.626 m)  PainSc: 2  2    Body mass index is 29.35 kg/m.     09/27/2023   10:37 AM 09/23/2022   10:03 AM 09/21/2021    9:09 AM 08/01/2021    4:00 AM 08/01/2021    3:50 AM 07/31/2021    8:19 PM 07/16/2021   10:35 AM  Advanced Directives  Does Patient Have a Medical Advance Directive? Yes No Yes  Yes Yes Yes  Type of Estate agent of [place];Living will  Living will;Healthcare Power of [employer] Power of [place];Living will Healthcare Power of [place];Out of facility DNR (pink MOST or yellow form);Living will Healthcare Power of [place];Living will  Does patient want to make changes to medical advance directive?    No - Patient declined   Yes (MAU/Ambulatory/Procedural Areas - Information given)  Copy of Healthcare Power of Attorney in Chart? No - copy requested    No -  copy requested    Would patient like information on creating a medical advance directive?  No - Patient declined         Current Medications (verified) Outpatient Encounter Medications as of 09/27/2023  Medication Sig   acetaminophen (TYLENOL) 500 MG tablet Take 1,000 mg by mouth at bedtime.   ALPRAZolam (XANAX) 1 MG tablet TAKE 1/2 TABLET BY MOUTH AT BEDTIME AS NEEDED FOR ANXIETY OR SLEEP   ASPERCREME LIDOCAINE EX Apply 1 application  topically at bedtime as needed (foot pain).   aspirin EC 81 MG tablet Take 81 mg by mouth daily. Swallow whole.   Calcium-Magnesium-Vitamin D (CALCIUM 1200+D3 PO) Take 1 tablet by mouth at bedtime.   cyclobenzaprine (FLEXERIL) 10 MG tablet Take 0.5 tablets (5 mg total) by mouth at bedtime as needed for muscle spasms (back pain). Caution of sedation   diclofenac Sodium (VOLTAREN) 1 % GEL APPLY 4 GRAMS TO AFFECTED AREA 4 TIMES DAILY AS NEEDED   Evolocumab (REPATHA SURECLICK) 140 MG/ML SOAJ Inject 140 mg into the skin every 14 (fourteen) days.   Melatonin 10 MG CAPS Take 10 mg by mouth at bedtime.   metoprolol succinate (TOPROL-XL) 25 MG 24 hr tablet Take 0.5 tablets (12.5 mg total) by mouth daily.   Multiple Vitamins-Minerals (EYE VITAMINS PO) Take 1 capsule by mouth daily.   pantoprazole (PROTONIX) 40 MG tablet TAKE 1 TABLET BY MOUTH TWICE  DAILY   triamcinolone cream (KENALOG) 0.1 % APPLY 1 APPLICATION TOPICALLY 2 (  TWO) TIMES DAILY AS NEEDED. RASH   No facility-administered encounter medications on file as of 09/27/2023.    Allergies (verified) Atorvastatin, Ezetimibe, Polyoxyethylene 40 sorbitol septaoleate [sorbitan], Prednisone, Rosuvastatin, Shingrix [zoster vac recomb adjuvanted], and Sulfamethoxazole-trimethoprim   History: Past Medical History:  Diagnosis Date   Allergy    Anemia    Anxiety    Arthritis    BBB (bundle branch block)    Blood transfusion without reported diagnosis    As a baby   Cataract    Complication of anesthesia     Depression    Diabetes mellitus without complication (HCC)    Dyspnea    Fatty liver    Fibrocystic disease of both breasts    GERD (gastroesophageal reflux disease)    Heart murmur 07-02-2021   Nurse practitioner found it on home visit   Hyperglycemia    PONV (postoperative nausea and vomiting)    Pre-diabetes    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   On continue HRT since 1980 per patient   Sphincter of Oddi dysfunction    Past Surgical History:  Procedure Laterality Date   ABDOMINAL HYSTERECTOMY     APPENDECTOMY  1990   BREAST SURGERY  [id number],[id number], 1989   Multiple breat surgeries over the years   CARPAL TUNNEL RELEASE  08/2008   left   CHOLECYSTECTOMY     COSMETIC SURGERY  1989 & 2012   Subq mastectomies and incapacitation of implants   ERCP     HEEL SPUR RESECTION Left 09/29/2021   Procedure: LEFT HEEL SPUR EXCISION AND ACHILLES TENDON REPAIR;  Surgeon: Marcene Corning, MD;  Location: [facility];  Service: Orthopedics;  Laterality: Left;   LEFT HEART CATHETERIZATION WITH CORONARY ANGIOGRAM N/A 06/08/2013   Procedure: LEFT HEART CATHETERIZATION WITH CORONARY ANGIOGRAM;  Surgeon: Alvia Grove, MD;  Location: [facility] CATH LAB;  Service: Cardiovascular;  Laterality: N/A;   MASTECTOMY     bilateral for severe macrocystic breast   SPINE SURGERY N/A 02/12/2020   neck   TUBAL LIGATION     Family History  Problem Relation Age of Onset   Brain cancer Mother        hospice   Diabetes Mother    Hypertension Mother    Varicose Veins Mother    Diabetes Father    Hypertension Father    Cancer Father        prostate and leukemia   Hearing loss Father    Breast cancer Sister 6   Cancer Sister    Cancer Maternal Aunt        Pancreatic   Diabetes Maternal Uncle    Heart disease Maternal Uncle    Heart disease Maternal Grandmother    Hyperlipidemia Maternal Grandmother    Stroke Maternal Grandmother    Obesity Maternal Grandfather    Cancer Sister    Social  History   Socioeconomic History   Marital status: Married    Spouse name: Not on file   Number of children: Not on file   Years of education: Not on file   Highest education level: Associate degree: academic program  Occupational History   Not on file  Tobacco Use   Smoking status: Never   Smokeless tobacco: Never  Vaping Use   Vaping status: Never Used  Substance and Sexual Activity   Alcohol use: Yes    Alcohol/week: 1.0 standard drink of alcohol    Types: 1 Shots of liquor per week    Comment: More like  once a month if that   Drug use: No   Sexual activity: Yes    Birth control/protection: Surgical    Comment: Hysterectomy  Other Topics Concern   Not on file  Social History Narrative   Not on file   Social Drivers of Health   Financial Resource Strain: Low Risk  (09/27/2023)   Overall Financial Resource Strain (CARDIA)    Difficulty of Paying Living Expenses: Not hard at all  Food Insecurity: No Food Insecurity (09/27/2023)   Hunger Vital Sign    Worried About Running Out of Food in the Last Year: Never true    Ran Out of Food in the Last Year: Never true  Transportation Needs: No Transportation Needs (09/27/2023)   PRAPARE - Administrator, Civil Service (Medical): No    Lack of Transportation (Non-Medical): No  Physical Activity: Sufficiently Active (09/27/2023)   Exercise Vital Sign    Days of Exercise per Week: 6 days    Minutes of Exercise per Session: 60 min  Recent Concern: Physical Activity - Inactive (07/28/2023)   Exercise Vital Sign    Days of Exercise per Week: 0 days    Minutes of Exercise per Session: 30 min  Stress: Stress Concern Present (09/27/2023)   [employer] of Occupational Health - Occupational Stress Questionnaire    Feeling of Stress : Rather much  Social Connections: Socially Integrated (09/27/2023)   Social Connection and Isolation Panel [NHANES]    Frequency of Communication with Friends and Family: More than three times  a week    Frequency of Social Gatherings with Friends and Family: Twice a week    Attends Religious Services: More than 4 times per year    Active Member of [employer] or Organizations: Yes    Attends Banker Meetings: 1 to 4 times per year    Marital Status: Married    Tobacco Counseling Counseling given: Not Answered  Clinical Intake:  Pre-visit preparation completed: No  Pain : 0-10 Pain Score: 2  Pain Type: Chronic pain Pain Location: Epigastric Pain Orientation: Right, Upper Pain Descriptors / Indicators: Dull, Sore Pain Onset: More than a month ago Pain Frequency: Intermittent Pain Relieving Factors: tylenol, rest off fit, less stress, no fried foods  Pain Relieving Factors: tylenol, rest off fit, less stress, no fried foods  BMI - recorded: 29.35 Nutritional Status: BMI 25 -29 Overweight Nutritional Risks: None Diabetes: Yes (pt says per diabetes) CBG done?: No Did pt. bring in CBG monitor from home?: No  How often do you need to have someone help you when you read instructions, pamphlets, or other written materials from your doctor or pharmacy?: 1 - Never  Interpreter Needed?: No  Comments: lives with husband Information entered by :: B.Lexis Potenza,LPN   Activities of Daily Living    09/23/2023    9:38 AM  In your present state of health, do you have any difficulty performing the following activities:  Hearing? 0  Vision? 1  Difficulty concentrating or making decisions? 0  Walking or climbing stairs? 0  Dressing or bathing? 0  Doing errands, shopping? 0  Preparing Food and eating ? N  Using the Toilet? N  In the past six months, have you accidently leaked urine? N  Do you have problems with loss of bowel control? N  Managing your Medications? N  Managing your Finances? N  Housekeeping or managing your Housekeeping? N    Patient Care Team: Tower, Audrie Gallus, MD as PCP - General  Thomasene Ripple, DO as PCP - Cardiology (Cardiology) Federico Flake, MD as Consulting Physician (Obstetrics and Gynecology) Elise Benne, MD as Consulting Physician (Ophthalmology)  Indicate any recent Medical Services you may have received from other than [facility] providers in the past year (date may be approximate).     Assessment:   This is a routine wellness examination for [place].  Hearing/Vision screen Hearing Screening - Comments::  Says she cannot hear low sounds and turns TV up louder Vision Screening - Comments:: Pt says her vision is blurry, has a film over and has cataracts that are not ripe yet to remove Dr.Sigmond Emily Filbert   Goals Addressed             This Visit's Progress    COMPLETED: DIET - EAT MORE FRUITS AND VEGETABLES       Patient Stated   On track    09/27/23-done this but continue this goal I will continue to get 10,000 steps in daily.      COMPLETED: Patient Stated   On track    Would like to maintain walking routine.        Depression Screen    09/27/2023   10:28 AM 08/01/2023    2:27 PM 10/27/2022    9:28 AM 09/23/2022   10:01 AM 07/26/2022   11:16 AM 07/26/2022   10:28 AM 07/04/2020    2:09 PM  PHQ 2/9 Scores  PHQ - 2 Score 0 2 0 0 0 0 0  PHQ- 9 Score  8 3 0  4 0    Fall Risk    09/23/2023    9:38 AM 08/01/2023    2:27 PM 10/27/2022    9:27 AM 09/23/2022   10:04 AM 07/16/2021   10:42 AM  Fall Risk   Falls in the past year? 0 0 0 0 1  Number falls in past yr:  0 0 0 0  Injury with Fall?  0 0 0 0  Risk for fall due to : No Fall Risks No Fall Risks No Fall Risks No Fall Risks Other (Comment)  Risk for fall due to: Comment     fell in a hole in the back yard  Follow up Education provided;Falls prevention discussed Falls evaluation completed Falls evaluation completed Falls prevention discussed;Falls evaluation completed Falls prevention discussed    MEDICARE RISK AT HOME: Medicare Risk at Home Any stairs in or around the home?: (Patient-Rptd) Yes If so, are there any without handrails?: (Patient-Rptd)  Yes Home free of loose throw rugs in walkways, pet beds, electrical cords, etc?: (Patient-Rptd) No Adequate lighting in your home to reduce risk of falls?: (Patient-Rptd) Yes Life alert?: (Patient-Rptd) No Use of a cane, walker or w/c?: (Patient-Rptd) No Grab bars in the bathroom?: (Patient-Rptd) No Shower chair or bench in shower?: (Patient-Rptd) No Elevated toilet seat or a handicapped toilet?: (Patient-Rptd) Yes  TIMED UP AND GO:  Was the test performed?  No    Cognitive Function:    07/04/2020    2:13 PM 06/06/2018    8:11 AM 05/10/2016    1:40 PM  MMSE - Mini Mental State Exam  Orientation to time 5 5 5   Orientation to Place 5 5 5   Registration 3 3 3   Attention/ Calculation 5 0 0  Recall 3 3 3   Language- name 2 objects  0 0  Language- repeat 1 1 1   Language- follow 3 step command  3 3  Language- read & follow direction  0 0  Write a sentence  0 0  Copy design  0 0  Total score  20 20        09/27/2023   10:41 AM 09/23/2022   10:10 AM  6CIT Screen  What Year? 0 points 0 points  What month? 0 points 0 points  What time? 0 points 0 points  Count back from 20 0 points 0 points  Months in reverse 0 points 0 points  Repeat phrase 2 points 0 points  Total Score 2 points 0 points    Immunizations Immunization History  Administered Date(s) Administered   Fluad Quad(high Dose 65+) 06/18/2019, 06/12/2020   Fluad Trivalent(High Dose 65+) 06/13/2023   Influenza Whole 05/30/2008   Influenza, High Dose Seasonal PF 06/03/2021   Influenza,inj,Quad PF,6+ Mos 05/03/2014, 06/06/2015, 05/10/2016, 05/26/2017, 06/09/2018   Influenza-Unspecified 05/24/2022   Moderna SARS-COV2 Booster Vaccination 12/03/2020   PFIZER(Purple Top)SARS-COV-2 Vaccination 10/05/2019, 10/30/2019, 06/05/2020   Pfizer Covid-19 Vaccine Bivalent Booster 31yrs & up 06/03/2021   Pfizer(Comirnaty)Fall Seasonal Vaccine 12 years and older 06/04/2022, 06/13/2023   Pneumococcal Conjugate-13 01/29/2015    Pneumococcal Polysaccharide-23 05/10/2016   Td 08/30/2002, 04/27/2019   Tdap 05/03/2014   Zoster Recombinant(Shingrix) 07/29/2021   Zoster, Live 09/07/2012    TDAP status: Up to date  Flu Vaccine status: Up to date  Pneumococcal vaccine status: Up to date  Covid-19 vaccine status: Completed vaccines  Qualifies for Shingles Vaccine? Yes   Zostavax completed Yes   Shingrix Completed?: Yes  Screening Tests Health Maintenance  Topic Date Due   FOOT EXAM  07/11/2021   COVID-19 Vaccine (7 - 2024-25 season) 08/08/2023   Colonoscopy  08/11/2023   OPHTHALMOLOGY EXAM  12/14/2023   MAMMOGRAM  12/20/2023   HEMOGLOBIN A1C  01/22/2024   Diabetic kidney evaluation - eGFR measurement  07/24/2024   Diabetic kidney evaluation - Urine ACR  07/31/2024   Medicare Annual Wellness (AWV)  09/26/2024   DTaP/Tdap/Td (4 - Td or Tdap) 04/26/2029   Pneumonia Vaccine 46+ Years old  Completed   INFLUENZA VACCINE  Completed   DEXA SCAN  Completed   Hepatitis C Screening  Completed   HPV VACCINES  Aged Out   Zoster Vaccines- Shingrix  Discontinued    Health Maintenance  Health Maintenance Due  Topic Date Due   FOOT EXAM  07/11/2021   COVID-19 Vaccine (7 - 2024-25 season) 08/08/2023   Colonoscopy  08/11/2023    Colorectal cancer screening: Type of screening: Colonoscopy. Completed 08/10/2013. Repeat every 10 years  Mammogram status: Ordered yes. Pt provided with contact info and advised to call to schedule appt.   Bone Density status: Completed no-has to schedule. Results reflect: Bone density results: NORMAL. Repeat every 5 years. Already ordered  Lung Cancer Screening: (Low Dose CT Chest recommended if Age 28-80 years, 20 pack-year currently smoking OR have quit w/in 15years.) does not qualify.   Lung Cancer Screening Referral: no  Additional Screening:  Hepatitis C Screening: does not qualify; Completed 09/28/2006  Vision Screening: Recommended annual ophthalmology exams for early  detection of glaucoma and other disorders of the eye. Is the patient up to date with their annual eye exam?  Yes  Who is the provider or what is the name of the office in which the patient attends annual eye exams? Dr Emily Filbert If pt is not established with a provider, would they like to be referred to a provider to establish care? No .   Dental Screening: Recommended annual dental exams for proper oral hygiene  Diabetic  Foot Exam: Diabetic Foot Exam: Overdue, Pt has been advised about the importance in completing this exam. Pt is scheduled for diabetic foot exam on Feb 2025 with PCP visit.  Community Resource Referral / Chronic Care Management: CRR required this visit?  No   CCM required this visit?  No     Plan:     I have personally reviewed and noted the following in the patient's chart:   Medical and social history Use of alcohol, tobacco or illicit drugs  Current medications and supplements including opioid prescriptions. Patient is not currently taking opioid prescriptions. Functional ability and status Nutritional status Physical activity Advanced directives List of other physicians Hospitalizations, surgeries, and ER visits in previous 12 months Vitals Screenings to include cognitive, depression, and falls Referrals and appointments  In addition, I have reviewed and discussed with patient certain preventive protocols, quality metrics, and best practice recommendations. A written personalized care plan for preventive services as well as general preventive health recommendations were provided to patient.    Sue Lush, LPN   1/61/0960   After Visit Summary: (MyChart) Due to this being a telephonic visit, the after visit summary with patients personalized plan was offered to patient via MyChart   Nurse Notes: Pt says she desires another medication to get her A1C down (wants GLP medication). She also relays she needs her Hgb checked again. Otherwise, she has no  concerns or questions.

## 2023-09-27 NOTE — Telephone Encounter (Signed)
Noted from amw visit   Nurse Notes: Pt says she desires another medication to get her A1C down (wants GLP medication). She also relays she needs her Hgb checked again. Otherwise, she has no concerns or questions.   Please let pt know that usually a GLP-1 med will not be covered unless metformin is tried and failed first or if unable to take it  We will discuss in detail at her next visit   Can schedule cbc whenever she wants  I will put order in

## 2023-09-27 NOTE — Patient Instructions (Signed)
Ms. Acord , Thank you for taking time to come for your Medicare Wellness Visit. I appreciate your ongoing commitment to your health goals. Please review the following plan we discussed and let me know if I can assist you in the future.   Referrals/Orders/Follow-Ups/Clinician Recommendations: You have an order for:  []   2D Mammogram  [x]   3D Mammogram  [x]   Bone Density -already ordered Both can be done at the same time    Please call for appointment:  [facility] [place] [facility] [place] [facility] [address] [place], [place] [address] [phone]  [facility] [address] [place], [place] [address] [phone]  [facility] [address] [place], [place] [address] [phone]  [facility] [place] Bone Density [address] [place], [place] [address] [phone]  [facility] [address] [place], [place] [address] [phone]    Make sure to wear two-piece clothing.  No lotions, powders, or deodorants the day of the appointment. Make sure to bring picture ID and insurance card.  Bring list of medications you are currently taking including any supplements.   Schedule your [place] screening mammogram through MyChart!   Log into your MyChart account.  Go to 'Visit' (or 'Appointments' if on mobile App) --> Schedule an Appointment  Under 'Select a Reason for Visit' choose the Mammogram Screening option.  Complete the pre-visit questions and select the time and place that best fits your schedule.    This is a list of the screening recommended for you and due dates:  Health Maintenance  Topic Date Due   Complete foot exam   07/11/2021   COVID-19 Vaccine (7 - 2024-25 season) 08/08/2023   Colon Cancer Screening  08/11/2023   Eye exam for diabetics  12/14/2023   Mammogram  12/20/2023   Hemoglobin A1C  01/22/2024   Yearly kidney function blood test for diabetes  07/24/2024    Yearly kidney health urinalysis for diabetes  07/31/2024   Medicare Annual Wellness Visit  09/26/2024   DTaP/Tdap/Td vaccine (4 - Td or Tdap) 04/26/2029   Pneumonia Vaccine  Completed   Flu Shot  Completed   DEXA scan (bone density measurement)  Completed   Hepatitis C Screening  Completed   HPV Vaccine  Aged Out   Zoster (Shingles) Vaccine  Discontinued    Advanced directives: (Copy Requested) Please bring a copy of your health care power of attorney and living will to the office to be added to your chart at your convenience.  Next Medicare Annual Wellness Visit scheduled for next year: Yes 09/27/2024 @ 10:10

## 2023-09-28 NOTE — Telephone Encounter (Signed)
Patient called back I relayed the message patient stated she would get the labs done at her next appt in march

## 2023-09-28 NOTE — Telephone Encounter (Signed)
Left message to return call to our office.

## 2023-10-31 ENCOUNTER — Ambulatory Visit (INDEPENDENT_AMBULATORY_CARE_PROVIDER_SITE_OTHER): Payer: Medicare Other | Admitting: Family Medicine

## 2023-10-31 ENCOUNTER — Encounter: Payer: Self-pay | Admitting: Family Medicine

## 2023-10-31 VITALS — BP 122/76 | HR 65 | Temp 98.0°F | Ht 64.0 in | Wt 172.1 lb

## 2023-10-31 DIAGNOSIS — Z7984 Long term (current) use of oral hypoglycemic drugs: Secondary | ICD-10-CM | POA: Diagnosis not present

## 2023-10-31 DIAGNOSIS — E2839 Other primary ovarian failure: Secondary | ICD-10-CM | POA: Diagnosis not present

## 2023-10-31 DIAGNOSIS — K760 Fatty (change of) liver, not elsewhere classified: Secondary | ICD-10-CM | POA: Diagnosis not present

## 2023-10-31 DIAGNOSIS — E119 Type 2 diabetes mellitus without complications: Secondary | ICD-10-CM

## 2023-10-31 DIAGNOSIS — R1011 Right upper quadrant pain: Secondary | ICD-10-CM

## 2023-10-31 DIAGNOSIS — D649 Anemia, unspecified: Secondary | ICD-10-CM

## 2023-10-31 DIAGNOSIS — E7800 Pure hypercholesterolemia, unspecified: Secondary | ICD-10-CM | POA: Diagnosis not present

## 2023-10-31 LAB — LIPASE: Lipase: 35 U/L (ref 11.0–59.0)

## 2023-10-31 LAB — HEPATIC FUNCTION PANEL
ALT: 29 U/L (ref 0–35)
AST: 19 U/L (ref 0–37)
Albumin: 4.5 g/dL (ref 3.5–5.2)
Alkaline Phosphatase: 66 U/L (ref 39–117)
Bilirubin, Direct: 0.2 mg/dL (ref 0.0–0.3)
Total Bilirubin: 0.8 mg/dL (ref 0.2–1.2)
Total Protein: 7.4 g/dL (ref 6.0–8.3)

## 2023-10-31 LAB — CBC WITH DIFFERENTIAL/PLATELET
Basophils Absolute: 0 10*3/uL (ref 0.0–0.1)
Basophils Relative: 0.4 % (ref 0.0–3.0)
Eosinophils Absolute: 0.1 10*3/uL (ref 0.0–0.7)
Eosinophils Relative: 2.2 % (ref 0.0–5.0)
HCT: 38.5 % (ref 36.0–46.0)
Hemoglobin: 12.2 g/dL (ref 12.0–15.0)
Lymphocytes Relative: 46.2 % — ABNORMAL HIGH (ref 12.0–46.0)
Lymphs Abs: 2.5 10*3/uL (ref 0.7–4.0)
MCHC: 31.7 g/dL (ref 30.0–36.0)
MCV: 78.3 fl (ref 78.0–100.0)
Monocytes Absolute: 0.4 10*3/uL (ref 0.1–1.0)
Monocytes Relative: 7.7 % (ref 3.0–12.0)
Neutro Abs: 2.4 10*3/uL (ref 1.4–7.7)
Neutrophils Relative %: 43.5 % (ref 43.0–77.0)
Platelets: 314 10*3/uL (ref 150.0–400.0)
RBC: 4.92 Mil/uL (ref 3.87–5.11)
RDW: 15.9 % — ABNORMAL HIGH (ref 11.5–15.5)
WBC: 5.5 10*3/uL (ref 4.0–10.5)

## 2023-10-31 LAB — BASIC METABOLIC PANEL
BUN: 13 mg/dL (ref 6–23)
CO2: 27 meq/L (ref 19–32)
Calcium: 9.6 mg/dL (ref 8.4–10.5)
Chloride: 105 meq/L (ref 96–112)
Creatinine, Ser: 0.58 mg/dL (ref 0.40–1.20)
GFR: 89.33 mL/min (ref >60.00)
Glucose, Bld: 111 mg/dL — ABNORMAL HIGH (ref 70–99)
Potassium: 4.5 meq/L (ref 3.5–5.1)
Sodium: 141 meq/L (ref 135–145)

## 2023-10-31 LAB — FERRITIN: Ferritin: 8.3 ng/mL — ABNORMAL LOW (ref 10.0–291.0)

## 2023-10-31 LAB — POCT GLYCOSYLATED HEMOGLOBIN (HGB A1C): Hemoglobin A1C: 6.4 % — AB (ref 4.0–5.6)

## 2023-10-31 LAB — IRON: Iron: 53 ug/dL (ref 42–145)

## 2023-10-31 MED ORDER — BLOOD GLUCOSE TEST VI STRP: 1.0000 | ORAL_STRIP | Freq: Every day | 3 refills | Status: AC

## 2023-10-31 MED ORDER — BLOOD GLUCOSE MONITORING SUPPL DEVI: 1.0000 | Freq: Every day | 0 refills | Status: DC

## 2023-10-31 MED ORDER — METFORMIN HCL ER 500 MG PO TB24: 500.0000 mg | ORAL_TABLET | Freq: Every day | ORAL | 3 refills | Status: DC

## 2023-10-31 MED ORDER — LANCETS MISC. MISC: 1.0000 | Freq: Every day | 3 refills | Status: AC

## 2023-10-31 MED ORDER — LANCET DEVICE MISC: 1.0000 | Freq: Every day | 0 refills | Status: AC

## 2023-10-31 NOTE — Assessment & Plan Note (Signed)
 Cbc with iron studies today  Has not been able to donate blood  Colonoscopy due in nov

## 2023-10-31 NOTE — Progress Notes (Signed)
 Subjective:    Patient ID: [name], female    DOB: 03-20-1951, 73 y.o.   MRN: [id number]  HPI  Wt Readings from Last 3 Encounters:  10/31/23 172 lb 2 oz (78.1 kg)  09/27/23 171 lb (77.6 kg)  08/01/23 171 lb 4 oz (77.7 kg)   29.55 kg/m  Vitals:   10/31/23 1120  BP: 122/76  Pulse: 65  Temp: 98 F (36.7 C)  SpO2: 99%    Pt presents for follow up of DM2, fatty liver, iron def  RUQ pain  Bone density-needs new order to do at [facility]   Recent ruq discomfort  Constant dull pain  Some tenderness  Radiates at times to her back  Wonders if it is her fatty liver  Worsened by eating fatty foods like pizza or fried foods  No n/v  No stool changes   In general gets abd pain if she gets really stressed   Had ccy in past  Has had ERPC for CBD operated on    Has colonoscopy due in November  Wants to get liver numbers     DM2 Lab Results  Component Value Date   HGBA1C 6.4 (A) 10/31/2023   HGBA1C 6.8 (H) 07/25/2023   HGBA1C 6.5 (H) 04/14/2023   Today down to 6.4    Working on diet and exercise since last visit -has been working on it   Was taking care of her husband   Lab Results  Component Value Date   MICROALBUR <0.7 08/01/2023      Fatty liver Pt has had some RUQ pain recently  Lab Results  Component Value Date   ALT 22 07/25/2023   AST 17 07/25/2023   ALKPHOS 67 07/25/2023   BILITOT 0.9 07/25/2023    History of mild anemia from blood donation in past Lab Results  Component Value Date   WBC 6.1 07/25/2023   HGB 11.8 (L) 07/25/2023   HCT 37.2 07/25/2023   MCV 78.1 07/25/2023   PLT 290.0 07/25/2023   No results found for: "IRON", "TIBC", "FERRITIN"    Hyperlipidemia Lab Results  Component Value Date   CHOL 158 07/25/2023   HDL 64.90 07/25/2023   LDLCALC 57 07/25/2023   LDLDIRECT 97.0 06/06/2018   TRIG 182.0 (H) 07/25/2023   CHOLHDL 2 07/25/2023   Takes repatha  Intol of statins and zetia    Patient Active Problem List    Diagnosis Date Noted   Mild anemia 09/27/2023   Minimal CAD 04/05/2022   Fatty liver 09/11/2021   Right upper quadrant abdominal pain 09/11/2021   Insomnia 09/11/2021   Hyperlipidemia 08/06/2021   Vaccine reaction 08/06/2021   GAD (generalized anxiety disorder) 08/01/2021   Current use of proton pump inhibitor 07/20/2021   Colon cancer screening 07/20/2021   Hot flashes 07/11/2020   Subclinical hypothyroidism 07/11/2020   Allergy to statin medication 07/11/2020   Medicare annual wellness visit, subsequent 07/10/2019   Paresthesia 04/06/2019   Foot pain, bilateral 04/06/2019   Vaginal atrophy 06/09/2018   Breast cancer screening 05/31/2017   Controlled type 2 diabetes mellitus without complication, without long-term current use of insulin (HCC) 06/02/2016   Estrogen deficiency 01/29/2015   Routine general medical examination at a health care facility 02/20/2012   GERD 06/20/2008   Past Medical History:  Diagnosis Date   Allergy    Anemia    Anxiety    Arthritis    BBB (bundle branch block)    Blood transfusion without reported diagnosis  As a baby   Cataract    Complication of anesthesia    Depression    Diabetes mellitus without complication (HCC)    Dyspnea    Fatty liver    Fibrocystic disease of both breasts    GERD (gastroesophageal reflux disease)    Heart murmur 07-02-2021   Nurse practitioner found it on home visit   Hyperglycemia    PONV (postoperative nausea and vomiting)    Pre-diabetes    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   On continue HRT since 1980 per patient   Sphincter of Oddi dysfunction    Past Surgical History:  Procedure Laterality Date   ABDOMINAL HYSTERECTOMY     APPENDECTOMY  1990   BREAST SURGERY  1610,9604, 1989   Multiple breat surgeries over the years   CARPAL TUNNEL RELEASE  08/2008   left   CHOLECYSTECTOMY     COSMETIC SURGERY  1989 & 2012   Subq mastectomies and incapacitation of implants   ERCP     HEEL  SPUR RESECTION Left 09/29/2021   Procedure: LEFT HEEL SPUR EXCISION AND ACHILLES TENDON REPAIR;  Surgeon: Marcene Corning, MD;  Location: [facility];  Service: Orthopedics;  Laterality: Left;   LEFT HEART CATHETERIZATION WITH CORONARY ANGIOGRAM N/A 06/08/2013   Procedure: LEFT HEART CATHETERIZATION WITH CORONARY ANGIOGRAM;  Surgeon: Alvia Grove, MD;  Location: [facility] CATH LAB;  Service: Cardiovascular;  Laterality: N/A;   MASTECTOMY     bilateral for severe macrocystic breast   SPINE SURGERY N/A 02/12/2020   neck   TUBAL LIGATION     Social History   Tobacco Use   Smoking status: Never   Smokeless tobacco: Never  Vaping Use   Vaping status: Never Used  Substance Use Topics   Alcohol use: Yes    Alcohol/week: 1.0 standard drink of alcohol    Types: 1 Shots of liquor per week    Comment: More like once a month if that   Drug use: No   Family History  Problem Relation Age of Onset   Brain cancer Mother        hospice   Diabetes Mother    Hypertension Mother    Varicose Veins Mother    Diabetes Father    Hypertension Father    Cancer Father        prostate and leukemia   Hearing loss Father    Breast cancer Sister 64   Cancer Sister    Cancer Maternal Aunt        Pancreatic   Diabetes Maternal Uncle    Heart disease Maternal Uncle    Heart disease Maternal Grandmother    Hyperlipidemia Maternal Grandmother    Stroke Maternal Grandmother    Obesity Maternal Grandfather    Cancer Sister    Allergies  Allergen Reactions   Atorvastatin Other (See Comments)    REACTION: rash   Ezetimibe Other (See Comments)    REACTION: muscle aches   Polyoxyethylene 40 Sorbitol Septaoleate [Sorbitan] Other (See Comments)   Prednisone Other (See Comments)    **INJECTIONS ONLY** caused tachycardia    Rosuvastatin Other (See Comments)    Fatigue and weakness   Shingrix [Zoster Vac Recomb Adjuvanted]     Reaction ?  Weakness on one side    Sulfamethoxazole-Trimethoprim Other (See  Comments)    Swelling of nostrils   Current Outpatient Medications on File Prior to Visit  Medication Sig Dispense Refill   acetaminophen (TYLENOL) 500 MG tablet Take 1,000  mg by mouth at bedtime.     ALPRAZolam (XANAX) 1 MG tablet TAKE 1/2 TABLET BY MOUTH AT BEDTIME AS NEEDED FOR ANXIETY OR SLEEP 15 tablet 3   ASPERCREME LIDOCAINE EX Apply 1 application  topically at bedtime as needed (foot pain).     aspirin EC 81 MG tablet Take 81 mg by mouth daily. Swallow whole.     Calcium-Magnesium-Vitamin D (CALCIUM 1200+D3 PO) Take 1 tablet by mouth at bedtime.     Cholecalciferol (D3-1000 PO) Take 1 capsule by mouth daily.     Cyanocobalamin (B-12 PO) Take 1,000 mcg by mouth daily.     cyclobenzaprine (FLEXERIL) 10 MG tablet Take 0.5 tablets (5 mg total) by mouth at bedtime as needed for muscle spasms (back pain). Caution of sedation 30 tablet 1   diclofenac Sodium (VOLTAREN) 1 % GEL APPLY 4 GRAMS TO AFFECTED AREA 4 TIMES DAILY AS NEEDED 200 g 11   Evolocumab (REPATHA SURECLICK) 140 MG/ML SOAJ Inject 140 mg into the skin every 14 (fourteen) days. 2 mL 12   Melatonin 10 MG CAPS Take 10 mg by mouth at bedtime.     metoprolol succinate (TOPROL-XL) 25 MG 24 hr tablet Take 0.5 tablets (12.5 mg total) by mouth daily. 45 tablet 3   Multiple Vitamins-Minerals (EYE VITAMINS PO) Take 1 capsule by mouth daily.     pantoprazole (PROTONIX) 40 MG tablet TAKE 1 TABLET BY MOUTH TWICE  DAILY 200 tablet 2   triamcinolone cream (KENALOG) 0.1 % APPLY 1 APPLICATION TOPICALLY 2 (TWO) TIMES DAILY AS NEEDED. RASH 30 g 3   No current facility-administered medications on file prior to visit.    Review of Systems  Constitutional:  Negative for activity change, appetite change, fatigue, fever and unexpected weight change.  HENT:  Negative for congestion, ear pain, rhinorrhea, sinus pressure and sore throat.   Eyes:  Negative for pain, redness and visual disturbance.  Respiratory:  Negative for cough, shortness of breath  and wheezing.   Cardiovascular:  Negative for chest pain and palpitations.  Gastrointestinal:  Negative for abdominal pain, blood in stool, constipation, diarrhea, nausea and vomiting.  Endocrine: Negative for polydipsia and polyuria.  Genitourinary:  Negative for dysuria, frequency and urgency.  Musculoskeletal:  Negative for arthralgias, back pain and myalgias.  Skin:  Negative for pallor and rash.  Allergic/Immunologic: Negative for environmental allergies.  Neurological:  Negative for dizziness, syncope and headaches.  Hematological:  Negative for adenopathy. Does not bruise/bleed easily.  Psychiatric/Behavioral:  Negative for decreased concentration and dysphoric mood. The patient is nervous/anxious.        Stressors        Objective:   Physical Exam Constitutional:      General: She is not in acute distress.    Appearance: Normal appearance. She is well-developed. She is obese. She is not ill-appearing or diaphoretic.  HENT:     Head: Normocephalic and atraumatic.     Mouth/Throat:     Mouth: Mucous membranes are moist.     Pharynx: Oropharynx is clear.  Eyes:     Conjunctiva/sclera: Conjunctivae normal.     Pupils: Pupils are equal, round, and reactive to light.  Neck:     Thyroid: No thyromegaly.     Vascular: No carotid bruit or JVD.  Cardiovascular:     Rate and Rhythm: Normal rate and regular rhythm.     Heart sounds: Normal heart sounds.     No gallop.  Pulmonary:     Effort: Pulmonary effort  is normal. No respiratory distress.     Breath sounds: Normal breath sounds. No wheezing or rales.  Abdominal:     General: Abdomen is protuberant. Bowel sounds are normal. There is no distension or abdominal bruit.     Palpations: Abdomen is soft. There is no hepatomegaly, splenomegaly, mass or pulsatile mass.     Tenderness: There is abdominal tenderness in the right upper quadrant. There is no right CVA tenderness, guarding or rebound. Negative signs include Murphy's sign  and McBurney's sign.  Musculoskeletal:     Cervical back: Normal range of motion and neck supple.     Right lower leg: No edema.     Left lower leg: No edema.  Lymphadenopathy:     Cervical: No cervical adenopathy.  Skin:    General: Skin is warm and dry.     Coloration: Skin is not pale.     Findings: No rash.  Neurological:     Mental Status: She is alert.     Coordination: Coordination normal.     Deep Tendon Reflexes: Reflexes are normal and symmetric. Reflexes normal.  Psychiatric:        Mood and Affect: Mood normal.           Assessment & Plan:   Problem List Items Addressed This Visit       Digestive   Fatty liver   Lab today  Some RUQ pain with fatty foods   Discussed diet /exercise for weight loss   Encouraged to avoid over use of tylenol and alcohol   Last LFTs normal in fall       Relevant Orders   Hepatic function panel     Endocrine   Controlled type 2 diabetes mellitus without complication, without long-term current use of insulin (HCC) - Primary   Improved with better diet /exercise Lab Results  Component Value Date   HGBA1C 6.4 (A) 10/31/2023   HGBA1C 6.8 (H) 07/25/2023   HGBA1C 6.5 (H) 04/14/2023   Normal foot exam Interested in metformin will try XR 500 mg daily  Reviewed possible side effects Lab today   Follow up 3 mo      Relevant Medications   metFORMIN (GLUCOPHAGE-XR) 500 MG 24 hr tablet   Other Relevant Orders   POCT HgB A1C (Completed)     Other   Right upper quadrant abdominal pain   Intermittent/chronic/ref to back  History of ccy and ercp in the past   Lab today  Reassuring exam - mild tenderness in RUQ w/o murphy sign   Encouraged to avoid fatty foods and things that flare condition  Does have history of fatty liver       Relevant Orders   Ferritin   CBC with Differential/Platelet   Basic metabolic panel   Hepatic function panel   Lipase   Mild anemia   Cbc with iron studies today  Has not been able to  donate blood  Colonoscopy due in nov       Relevant Medications   Cyanocobalamin (B-12 PO)   Other Relevant Orders   Iron   Ferritin   CBC with Differential/Platelet   Hyperlipidemia   Disc goals for lipids and reasons to control them Rev last labs with pt Rev low sat fat diet in detail  Taking repatha   LDL controlled at 57   Allergic to statins  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

## 2023-10-31 NOTE — Assessment & Plan Note (Signed)
 Intermittent/chronic/ref to back  History of ccy and ercp in the past   Lab today  Reassuring exam - mild tenderness in RUQ w/o murphy sign   Encouraged to avoid fatty foods and things that flare condition  Does have history of fatty liver

## 2023-10-31 NOTE — Patient Instructions (Addendum)
 Continue trying to eat less processed carbs   Add some strength training to your routine, this is important for bone and brain health and can reduce your risk of falls and help your body use insulin properly and regulate weight  Light weights, exercise bands , and internet videos are a good way to start  Yoga (chair or regular), machines , floor exercises or a gym with machines are also good options   Keep working on weight loss   Try the metformin xr 500 mg daily  If diarrhea or any intolerable side effects / stop it   I sent glucose test equip to your pharmacy  Check glucose daily at different times and keep a log   Labs today for cbc, iron , liver , pancreas , kidney function    Take care of yourself   Follow up in 3 months   You have an order for:  []   2D Mammogram  []   3D Mammogram  [x]   Bone Density     Please call for appointment:   []   [facility]  [address] [place] [address]  [phone]  []   [facility])   [address]  [place], [place] [address]  [phone]  []   [facility]      [address] [place], [place]        [phone]         []   [facility]  [address] [place], [place]  [phone]  [x]  [facility]   [address]   [place], [place] [address]  [phone]  []  [facility]  [address] [place], [place] [address] [phone]    Make sure to wear two piece clothing  No lotions powders or deodorants the day of the appointment Make sure to bring picture ID and insurance card.  Bring list of medications you are currently taking including any supplements.   Schedule your screening mammogram through MyChart!   Select [facility] imaging sites can now be scheduled through  MyChart.  Log into your MyChart account.  Go to 'Visit' (or 'Appointments' if  on mobile App) --> Schedule an  Appointment  Under 'Select a Reason for Visit' choose the Mammogram  Screening option.  Complete the pre-visit questions  and select the time and place that  best fits your schedule

## 2023-10-31 NOTE — Assessment & Plan Note (Signed)
 Dexa ordered

## 2023-10-31 NOTE — Assessment & Plan Note (Signed)
 Improved with better diet /exercise Lab Results  Component Value Date   HGBA1C 6.4 (A) 10/31/2023   HGBA1C 6.8 (H) 07/25/2023   HGBA1C 6.5 (H) 04/14/2023   Normal foot exam Interested in metformin will try XR 500 mg daily  Reviewed possible side effects Lab today   Follow up 3 mo

## 2023-10-31 NOTE — Assessment & Plan Note (Signed)
 Lab today  Some RUQ pain with fatty foods   Discussed diet /exercise for weight loss   Encouraged to avoid over use of tylenol and alcohol   Last LFTs normal in fall

## 2023-10-31 NOTE — Assessment & Plan Note (Signed)
 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

## 2023-11-14 DIAGNOSIS — Z1212 Encounter for screening for malignant neoplasm of rectum: Secondary | ICD-10-CM | POA: Diagnosis not present

## 2023-11-14 DIAGNOSIS — Z1211 Encounter for screening for malignant neoplasm of colon: Secondary | ICD-10-CM | POA: Diagnosis not present

## 2023-11-16 LAB — COLOGUARD: Cologuard: NEGATIVE

## 2023-11-17 ENCOUNTER — Other Ambulatory Visit: Payer: Self-pay | Admitting: Family Medicine

## 2023-11-17 ENCOUNTER — Ambulatory Visit (INDEPENDENT_AMBULATORY_CARE_PROVIDER_SITE_OTHER)
Admission: RE | Admit: 2023-11-17 | Discharge: 2023-11-17 | Disposition: A | Discharge status: Home / Self Care | Source: Ambulatory Visit | Attending: Family Medicine | Admitting: Family Medicine

## 2023-11-17 DIAGNOSIS — E2839 Other primary ovarian failure: Secondary | ICD-10-CM

## 2023-11-17 NOTE — Telephone Encounter (Signed)
 Name of Medication: Xanax Name of Pharmacy: [facility] Last Fill or Written Date and Quantity: 07/24/23 #15 tab/ 3 refill Last Office Visit and Type: f/u on 10/31/23 Next Office Visit and Type: f/u 02/07/24

## 2023-11-18 ENCOUNTER — Encounter: Payer: Self-pay | Admitting: *Deleted

## 2023-11-18 ENCOUNTER — Encounter: Payer: Self-pay | Admitting: Family Medicine

## 2023-11-18 DIAGNOSIS — M8580 Other specified disorders of bone density and structure, unspecified site: Secondary | ICD-10-CM | POA: Insufficient documentation

## 2023-11-22 LAB — EXTERNAL GENERIC LAB PROCEDURE: COLOGUARD: NEGATIVE

## 2023-11-22 LAB — COLOGUARD: COLOGUARD: NEGATIVE

## 2023-11-23 ENCOUNTER — Encounter: Payer: Self-pay | Admitting: Family Medicine

## 2023-11-28 ENCOUNTER — Encounter: Payer: Self-pay | Admitting: *Deleted

## 2023-11-30 ENCOUNTER — Telehealth: Payer: Self-pay | Admitting: Family Medicine

## 2023-11-30 NOTE — Telephone Encounter (Signed)
 Copied from CRM [id number]. Topic: General - Other >> Nov 30, 2023 10:41 AM Pascal Lux wrote: Reason for CRM: Patient called because she just started taking metFORMIN (GLUCOPHAGE-XR) 500 MG 24 hr tablet [[id number]] and  Dr. Milinda Antis advised her to call and let her know how she is feeling. Patient stated having indigestion since the first day she started taking it, going to the bathroom a lot, extremely tired around 3-4pm in the afternoon.

## 2023-11-30 NOTE — Telephone Encounter (Signed)
 Pt notified of Dr. Royden Purl comments and verbalized understanding. Med removed from med list and added to allergy list as a intolerance med

## 2023-11-30 NOTE — Addendum Note (Signed)
 Addended by: Shon Millet on: 11/30/2023 12:03 PM   Modules accepted: Orders

## 2023-11-30 NOTE — Telephone Encounter (Signed)
 Stop the metformin xr  Sorry it did not work out  Put on intolerance list (GI intol) and remove from med list   Keep the 3 month follow up and keep working on diet and exercise Thanks for letting me know

## 2023-12-13 DIAGNOSIS — H43813 Vitreous degeneration, bilateral: Secondary | ICD-10-CM | POA: Diagnosis not present

## 2023-12-13 DIAGNOSIS — H2513 Age-related nuclear cataract, bilateral: Secondary | ICD-10-CM | POA: Diagnosis not present

## 2023-12-13 DIAGNOSIS — H524 Presbyopia: Secondary | ICD-10-CM | POA: Diagnosis not present

## 2023-12-13 DIAGNOSIS — H5201 Hypermetropia, right eye: Secondary | ICD-10-CM | POA: Diagnosis not present

## 2023-12-13 LAB — HM DIABETES EYE EXAM

## 2023-12-27 DIAGNOSIS — Z1231 Encounter for screening mammogram for malignant neoplasm of breast: Secondary | ICD-10-CM | POA: Diagnosis not present

## 2023-12-27 LAB — HM MAMMOGRAPHY

## 2023-12-30 ENCOUNTER — Encounter: Payer: Self-pay | Admitting: Family Medicine

## 2023-12-31 ENCOUNTER — Other Ambulatory Visit: Payer: Self-pay | Admitting: Family Medicine

## 2024-02-06 ENCOUNTER — Ambulatory Visit: Payer: Self-pay

## 2024-02-06 ENCOUNTER — Other Ambulatory Visit: Payer: Self-pay | Admitting: Family Medicine

## 2024-02-06 MED ORDER — VALACYCLOVIR HCL 1 G PO TABS: 1000.0000 mg | ORAL_TABLET | Freq: Three times a day (TID) | ORAL | 0 refills | Status: DC

## 2024-02-06 NOTE — Telephone Encounter (Signed)
 I sent valtrex  to her pharmacy for possible shingles Will see her as planned Thanks

## 2024-02-06 NOTE — Telephone Encounter (Signed)
 FYI Only or Action Required?: Action required by provider  Patient was last seen in primary care on 10/31/2023 by Clemens Curt, MD. Called Nurse Triage reporting Rash. Symptoms began several days ago. Interventions attempted: OTC medications: Benadryl ointment. Symptoms are: gradually worsening.  Triage Disposition: See Physician Within 24 Hours  Patient/caregiver understands and will follow disposition?: Yes       Copied from CRM [id number]. Topic: Clinical - Red Word Triage >> Feb 06, 2024  8:32 AM Essie A wrote: Red Word that prompted transfer to Nurse Triage: Patient thinks she has shingles.  She noticed it on Saturday.  Very itchy. Reason for Disposition  [1] Shingles rash (matches SYMPTOMS) AND [2] onset < 72 hours ago (3 days)  Answer Assessment - Initial Assessment Questions 1. APPEARANCE of RASH: "Describe the rash."      Started out as little welts and she thought is was mosquito bites 2 days agao  2. LOCATION: "Where is the rash located?"      Right side of back near scapula, and makes a "moon shape" around to just under the breats  3. NUMBER: "How many spots are there?"      1 large spart  4. SIZE: "How big are the spots?" (Inches, centimeters or compare to size of a coin)      Several inches  5. ONSET: "When did the rash start?"      2 days ago  6. ITCHING: "Does the rash itch?" If Yes, ask: "How bad is the itch?"  (Scale 0-10; or none, mild, moderate, severe)     Itchy, moderate  7. PAIN: "Does the rash hurt?" If Yes, ask: "How bad is the pain?"  (Scale 0-10; or none, mild, moderate, severe)    - NONE (0): no pain    - MILD (1-3): doesn't interfere with normal activities     - MODERATE (4-7): interferes with normal activities or awakens from sleep     - SEVERE (8-10): excruciating pain, unable to do any normal activities     Tingly pain   8. OTHER SYMPTOMS: "Do you have any other symptoms?" (e.g., fever)     No  9. PREGNANCY: "Is there any chance you are  pregnant?" "When was your last menstrual period?"     No   Patient has 3 month f/u tomorrow, will add shingles to appt notes. Patient is requesting to be started on medication treatment for shingles prior to her visit. Pharmacy confirmed  Protocols used: Rash or Redness - Localized-A-AH, Shingles (Zoster)-A-AH

## 2024-02-06 NOTE — Telephone Encounter (Signed)
 See triage nurses comments at the bottom  Patient has 3 month f/u tomorrow, will add shingles to appt notes. Patient is requesting to be started on medication treatment for shingles prior to her visit. Pharmacy confirmed

## 2024-02-06 NOTE — Telephone Encounter (Signed)
Pt notified Rx sent to pharmacy and advised of Dr. Tower's comments  

## 2024-02-07 ENCOUNTER — Ambulatory Visit (INDEPENDENT_AMBULATORY_CARE_PROVIDER_SITE_OTHER): Admitting: Family Medicine

## 2024-02-07 ENCOUNTER — Encounter: Payer: Self-pay | Admitting: Family Medicine

## 2024-02-07 VITALS — BP 114/70 | HR 68 | Temp 98.2°F | Ht 64.0 in | Wt 169.4 lb

## 2024-02-07 DIAGNOSIS — Z7984 Long term (current) use of oral hypoglycemic drugs: Secondary | ICD-10-CM | POA: Diagnosis not present

## 2024-02-07 DIAGNOSIS — E119 Type 2 diabetes mellitus without complications: Secondary | ICD-10-CM

## 2024-02-07 DIAGNOSIS — K760 Fatty (change of) liver, not elsewhere classified: Secondary | ICD-10-CM

## 2024-02-07 DIAGNOSIS — B029 Zoster without complications: Secondary | ICD-10-CM | POA: Diagnosis not present

## 2024-02-07 DIAGNOSIS — E7800 Pure hypercholesterolemia, unspecified: Secondary | ICD-10-CM

## 2024-02-07 LAB — POCT GLYCOSYLATED HEMOGLOBIN (HGB A1C): Hemoglobin A1C: 6.3 % — AB (ref 4.0–5.6)

## 2024-02-07 MED ORDER — METFORMIN HCL ER 500 MG PO TB24: 500.0000 mg | ORAL_TABLET | Freq: Every day | ORAL | Status: DC

## 2024-02-07 NOTE — Assessment & Plan Note (Signed)
 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

## 2024-02-07 NOTE — Assessment & Plan Note (Signed)
 Lab Results  Component Value Date   HGBA1C 6.3 (A) 02/07/2024   HGBA1C 6.4 (A) 10/31/2023   HGBA1C 6.8 (H) 07/25/2023   Is trying metformin  xr 500 mg daily -thinks it gives her leg cramps and taking intermittently  Eating better when not on vacation  Encouraged strength training  Microalb utd  Allergic to statin  Follow up in dec for annual exam

## 2024-02-07 NOTE — Assessment & Plan Note (Signed)
 Lab Results  Component Value Date   ALT 29 10/31/2023   AST 19 10/31/2023   ALKPHOS 66 10/31/2023   BILITOT 0.8 10/31/2023   Avoids tylenol  Working on weight loss  No longer having the RUQ pain often Fibrosis score is in low risk Continue to follow

## 2024-02-07 NOTE — Patient Instructions (Addendum)
 Keep the shingles rash clean [name] and water  An antihistamine like claritin or zyrtec (oral) may help itching also  Finish the valcyclovir  A clean cold compress is ok   Update us  if this gets worse instead of better   Stay active Keep walking  Add some strength training to your routine, this is important for bone and brain health and can reduce your risk of falls and help your body use insulin properly and regulate weight  Light weights, exercise bands , and internet videos are a good way to start  Yoga (chair or regular), machines , floor exercises or a gym with machines are also good options    Continue to experiment with metformin    Follow up for annual exam after 07/31/24    Take care of yourself

## 2024-02-07 NOTE — Progress Notes (Signed)
 Subjective:    Patient ID: [name], female    DOB: 1951-03-24, 73 y.o.   MRN: [id number]  HPI  Wt Readings from Last 3 Encounters:  02/07/24 169 lb 6 oz (76.8 kg)  10/31/23 172 lb 2 oz (78.1 kg)  09/27/23 171 lb (77.6 kg)   29.07 kg/m  Vitals:   02/07/24 1117  BP: 114/70  Pulse: 68  Temp: 98.2 F (36.8 C)  SpO2: 96%    Pt presents for follow up of DM2 and chronic medical problems Also rash-suspects shingles   Called yesterday for rash Valtrex  sent to pharmacy   Rash on right shoulder blade area - vesicles in clusters  Only on right side  Itchy and uncomfortable   Had one shingrix in 2022  Had side effect and did not get 2nd     Diabetes Home sugar results  Ams generally below 120s  120s later in day  DM diet -see below  Exercise up to 23,000 steps per day  A lot of lifting    Symptoms Lab Results  Component Value Date   HGBA1C 6.3 (A) 02/07/2024   HGBA1C 6.4 (A) 10/31/2023   HGBA1C 6.8 (H) 07/25/2023   Good control overall   Metformin  xr 500 mg daily -thought it caused cramps / has been on and off of it  Some loose stools (more frequent stools)   Has been on vacation -ate poorly  When not on vacation eats fairly well  Only bread is whole wheat No soft drinks (only non calorie beverages)    Weight is down 3 lb    Lab Results  Component Value Date   MICROALBUR <0.7 08/01/2023    No problems with medications  Renal protection Last eye exam     Fatty liver  Has stopped tylenol  entirely   Lab Results  Component Value Date   ALT 29 10/31/2023   AST 19 10/31/2023   ALKPHOS 66 10/31/2023   BILITOT 0.8 10/31/2023    Lab Results  Component Value Date   LIPASE 35.0 10/31/2023   Lab Results  Component Value Date   WBC 5.5 10/31/2023   HGB 12.2 10/31/2023   HCT 38.5 10/31/2023   MCV 78.3 10/31/2023   PLT 314.0 10/31/2023      Fibrosis 4 Score = .83  Fib-4 interpretation is not validated for people under 35 or over 78  years of age. However, scores under 2.0 are generally considered low risk.  Hyperlipidemia Lab Results  Component Value Date   CHOL 158 07/25/2023   HDL 64.90 07/25/2023   LDLCALC 57 07/25/2023   LDLDIRECT 97.0 06/06/2018   TRIG 182.0 (H) 07/25/2023   CHOLHDL 2 07/25/2023   Allergic to statin medicine Repatha      Patient Active Problem List   Diagnosis Date Noted   Shingles 02/07/2024   Osteopenia 11/18/2023   Mild anemia 09/27/2023   Minimal CAD 04/05/2022   Fatty liver 09/11/2021   Right upper quadrant abdominal pain 09/11/2021   Insomnia 09/11/2021   Hyperlipidemia 08/06/2021   Vaccine reaction 08/06/2021   GAD (generalized anxiety disorder) 08/01/2021   Current use of proton pump inhibitor 07/20/2021   Colon cancer screening 07/20/2021   Hot flashes 07/11/2020   Subclinical hypothyroidism 07/11/2020   Allergy to statin medication 07/11/2020   Medicare annual wellness visit, subsequent 07/10/2019   Paresthesia 04/06/2019   Foot pain, bilateral 04/06/2019   Vaginal atrophy 06/09/2018   Breast cancer screening 05/31/2017   Controlled type 2 diabetes  mellitus without complication, without long-term current use of insulin (HCC) 06/02/2016   Estrogen deficiency 01/29/2015   Routine general medical examination at a health care facility 02/20/2012   GERD 06/20/2008   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   On continue  HRT since 1980 per patient   Sphincter of Oddi dysfunction    Past Surgical History:  Procedure Laterality Date   ABDOMINAL HYSTERECTOMY     APPENDECTOMY  1990   BREAST SURGERY  1610,9604, 1989   Multiple breat surgeries over the years   CARPAL TUNNEL RELEASE  08/2008   left   CHOLECYSTECTOMY     COSMETIC SURGERY  1989 & 2012   Subq mastectomies and incapacitation of implants   ERCP     HEEL SPUR RESECTION Left 09/29/2021   Procedure: LEFT HEEL SPUR EXCISION AND ACHILLES TENDON REPAIR;  Surgeon: Dayne Even, MD;  Location: [facility];  Service: Orthopedics;  Laterality: Left;   LEFT HEART CATHETERIZATION WITH CORONARY ANGIOGRAM N/A 06/08/2013   Procedure: LEFT HEART CATHETERIZATION WITH CORONARY ANGIOGRAM;  Surgeon: Patrici Boom, MD;  Location: [facility] CATH LAB;  Service: Cardiovascular;  Laterality: N/A;   MASTECTOMY     bilateral for severe macrocystic breast   SPINE SURGERY N/A 02/12/2020   neck   TUBAL LIGATION     Social History   Tobacco Use   Smoking status: Never   Smokeless tobacco: Never  Vaping Use   Vaping status: Never Used  Substance Use Topics   Alcohol use: Yes    Alcohol/week: 1.0 standard drink of alcohol    Types: 1 Shots of liquor per week    Comment: More like once a month if that   Drug use: No   Family History  Problem Relation Age of Onset   Brain cancer Mother        hospice   Diabetes Mother    Hypertension Mother    Varicose Veins Mother    Diabetes Father    Hypertension Father    Cancer Father        prostate and leukemia   Hearing loss Father    Breast cancer Sister 33   Cancer Sister    Cancer Maternal Aunt        Pancreatic   Diabetes Maternal Uncle    Heart disease Maternal Uncle    Heart disease Maternal Grandmother    Hyperlipidemia Maternal Grandmother    Stroke Maternal Grandmother    Obesity Maternal Grandfather    Cancer Sister    Allergies  Allergen Reactions   Atorvastatin Other (See Comments)    REACTION:  rash   Ezetimibe Other (See Comments)    REACTION: muscle aches   Metformin  And Related Other (See Comments)    Muscle cramps- can tolerate  Polyoxyethylene 40 Sorbitol Septaoleate [Sorbitan] Other (See Comments)   Prednisone  Other (See Comments)    **INJECTIONS ONLY** caused tachycardia    Rosuvastatin  Other (See Comments)    Fatigue and weakness   Shingrix [Zoster Vac Recomb Adjuvanted]     Reaction ?  Weakness on one side    Sulfamethoxazole-Trimethoprim Other (See Comments)    Swelling of nostrils   Current Outpatient Medications on File Prior to Visit  Medication Sig Dispense Refill   ALPRAZolam  (XANAX ) 1 MG tablet TAKE 1/2 TABLET BY MOUTH AT BEDTIME AS NEEDED FOR ANXIETY OR SLEEP 15 tablet 3   ASPERCREME LIDOCAINE  EX Apply 1 application  topically at bedtime as needed (foot pain).     aspirin  EC 81 MG tablet Take 81 mg by mouth daily. Swallow whole.     Blood Glucose Monitoring Suppl (ACCU-CHEK GUIDE ME) w/Device KIT Use to check blood sugar once daily  (dx. E11.9) 1 kit 0   Calcium -Magnesium -Vitamin D  (CALCIUM  1200+D3 PO) Take 1 tablet by mouth at bedtime.     Cholecalciferol (D3-1000 PO) Take 1 capsule by mouth daily.     Cyanocobalamin  (B-12 PO) Take 1,000 mcg by mouth daily.     cyclobenzaprine  (FLEXERIL ) 10 MG tablet Take 0.5 tablets (5 mg total) by mouth at bedtime as needed for muscle spasms (back pain). Caution of sedation 30 tablet 1   diclofenac  Sodium (VOLTAREN ) 1 % GEL APPLY 4 GRAMS TO AFFECTED AREA 4 TIMES DAILY AS NEEDED 200 g 11   Evolocumab  (REPATHA  SURECLICK) 140 MG/ML SOAJ Inject 140 mg into the skin every 14 (fourteen) days. 2 mL 12   Glucose Blood (BLOOD GLUCOSE TEST STRIPS) STRP 1 each by In Vitro route daily. May substitute to any manufacturer covered by patient's insurance. 100 strip 3   Lancet Device MISC 1 each by Does not apply route daily. May substitute to any manufacturer covered by patient's insurance. 1 each 0   Lancets Misc. MISC 1 each by Does not  apply route daily. May substitute to any manufacturer covered by patient's insurance. 100 each 3   Melatonin 10 MG CAPS Take 10 mg by mouth at bedtime.     metoprolol  succinate (TOPROL -XL) 25 MG 24 hr tablet Take 0.5 tablets (12.5 mg total) by mouth daily. 45 tablet 3   Multiple Vitamins-Minerals (EYE VITAMINS PO) Take 1 capsule by mouth daily.     pantoprazole  (PROTONIX ) 40 MG tablet TAKE 1 TABLET BY MOUTH TWICE  DAILY 200 tablet 2   triamcinolone  cream (KENALOG ) 0.1 % APPLY 1 APPLICATION TOPICALLY 2 (TWO) TIMES DAILY AS NEEDED. RASH 30 g 3   valACYclovir  (VALTREX ) 1000 MG tablet Take 1 tablet (1,000 mg total) by mouth 3 (three) times daily. 21 tablet 0   No current facility-administered medications on file prior to visit.    Review of Systems  Constitutional:  Negative for activity change, appetite change, fatigue, fever and unexpected weight change.  HENT:  Negative for congestion, ear pain, rhinorrhea, sinus pressure and sore throat.   Eyes:  Negative for pain, redness and visual disturbance.  Respiratory:  Negative for cough, shortness of breath and wheezing.   Cardiovascular:  Negative for chest pain and palpitations.  Gastrointestinal:  Negative for abdominal pain, blood in stool, constipation and diarrhea.  Endocrine: Negative for polydipsia and polyuria.  Genitourinary:  Negative for dysuria, frequency and urgency.  Musculoskeletal:  Negative for arthralgias, back pain and myalgias.  Skin:  Positive for rash. Negative for pallor.  Allergic/Immunologic: Negative for environmental  allergies.  Neurological:  Negative for dizziness, syncope and headaches.  Hematological:  Negative for adenopathy. Does not bruise/bleed easily.  Psychiatric/Behavioral:  Negative for decreased concentration and dysphoric mood. The patient is not nervous/anxious.        Objective:   Physical Exam Constitutional:      General: She is not in acute distress.    Appearance: Normal appearance. She is  well-developed. She is not ill-appearing or diaphoretic.     Comments: Overweight   HENT:     Head: Normocephalic and atraumatic.  Eyes:     General:        Right eye: No discharge.        Left eye: No discharge.     Conjunctiva/sclera: Conjunctivae normal.     Pupils: Pupils are equal, round, and reactive to light.  Neck:     Thyroid : No thyromegaly.     Vascular: No carotid bruit or JVD.  Cardiovascular:     Rate and Rhythm: Normal rate and regular rhythm.     Heart sounds: Normal heart sounds.     No gallop.  Pulmonary:     Effort: Pulmonary effort is normal. No respiratory distress.     Breath sounds: Normal breath sounds. No wheezing or rales.  Abdominal:     General: There is no distension or abdominal bruit.     Palpations: Abdomen is soft. There is no mass.     Tenderness: There is no abdominal tenderness.  Musculoskeletal:     Cervical back: Normal range of motion and neck supple.     Right lower leg: No edema.     Left lower leg: No edema.  Lymphadenopathy:     Cervical: No cervical adenopathy.  Skin:    General: Skin is warm and dry.     Coloration: Skin is not pale.     Findings: Rash present.     Comments: Patchy vesicular rash over right back T3-5 dermatomal distribution Mildly tender No drainage  Consistent with zoster   Neurological:     Mental Status: She is alert.     Coordination: Coordination normal.     Deep Tendon Reflexes: Reflexes are normal and symmetric. Reflexes normal.  Psychiatric:        Mood and Affect: Mood normal.           Assessment & Plan:   Problem List Items Addressed This Visit       Digestive   Fatty liver   Lab Results  Component Value Date   ALT 29 10/31/2023   AST 19 10/31/2023   ALKPHOS 66 10/31/2023   BILITOT 0.8 10/31/2023   Avoids tylenol  Working on weight loss  No longer having the RUQ pain often Fibrosis score is in low risk Continue to follow         Endocrine   Controlled type 2 diabetes  mellitus without complication, without long-term current use of insulin (HCC) - Primary   Lab Results  Component Value Date   HGBA1C 6.3 (A) 02/07/2024   HGBA1C 6.4 (A) 10/31/2023   HGBA1C 6.8 (H) 07/25/2023   Is trying metformin  xr 500 mg daily -thinks it gives her leg cramps and taking intermittently  Eating better when not on vacation  Encouraged strength training  Microalb utd  Allergic to statin  Follow up in dec for annual exam      Relevant Medications   metFORMIN  (GLUCOPHAGE -XR) 500 MG 24 hr tablet   Other Relevant Orders   POCT HgB  A1C (Completed)     Nervous and Auditory   Shingles   Right back T3-5 dermatomal distribution  Had one shingrix in past -did not get 2nd due to side effects  Tolerating well/ some itching more than pain  Taking valtrex  1 g tid for 7 d  Encouraged to keep clean and away from irritation/trauma  Can try antihistamine for itch  Update if not starting to improve in a week or if worsening  Call back and Er precautions noted in detail today          Other   Hyperlipidemia   Disc goals for lipids and reasons to control them Rev last labs with pt Rev low sat fat diet in detail  Taking repatha    LDL controlled at 57   Allergic to statins  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

## 2024-02-07 NOTE — Assessment & Plan Note (Signed)
 Right back T3-5 dermatomal distribution  Had one shingrix in past -did not get 2nd due to side effects  Tolerating well/ some itching more than pain  Taking valtrex  1 g tid for 7 d  Encouraged to keep clean and away from irritation/trauma  Can try antihistamine for itch  Update if not starting to improve in a week or if worsening  Call back and Er precautions noted in detail today

## 2024-02-16 ENCOUNTER — Other Ambulatory Visit: Payer: Self-pay | Admitting: Cardiology

## 2024-03-17 ENCOUNTER — Other Ambulatory Visit: Payer: Self-pay | Admitting: Family Medicine

## 2024-03-19 NOTE — Telephone Encounter (Signed)
 Name of Medication: Xanax  Name of Pharmacy: [facility] Last Fill or Written Date and Quantity: 11/17/23 #15 tab/ 3 refill Last Office Visit and Type: f/u 02/07/24 Next Office Visit and Type: CPE on 08/01/24

## 2024-04-04 ENCOUNTER — Other Ambulatory Visit: Payer: Self-pay | Admitting: Cardiology

## 2024-04-09 ENCOUNTER — Telehealth: Payer: Self-pay | Admitting: Cardiology

## 2024-04-09 DIAGNOSIS — I2510 Atherosclerotic heart disease of native coronary artery without angina pectoris: Secondary | ICD-10-CM

## 2024-04-09 DIAGNOSIS — E7800 Pure hypercholesterolemia, unspecified: Secondary | ICD-10-CM

## 2024-04-09 NOTE — Telephone Encounter (Signed)
*  STAT* If patient is at the pharmacy, call can be transferred to refill team.   1. Which medications need to be refilled? (please list name of each medication and dose if known)   Evolocumab  (REPATHA  SURECLICK) 140 MG/ML SOAJ     2. Would you like to learn more about the convenience, safety, & potential cost savings by using the [facility]? No   3. Are you open to using the [facility] (Type [facility] No   4. Which pharmacy/location (including street and city if local pharmacy) is medication to be sent to? [id number] - [place], [place] - [address]    5. Do they need a 30 day or 90 day supply? 30 day

## 2024-04-10 ENCOUNTER — Other Ambulatory Visit: Payer: Self-pay | Admitting: Family Medicine

## 2024-04-10 NOTE — Telephone Encounter (Signed)
 CPE scheduled 08/01/24  Last filled on 08/01/23 #30 tab/ 1 refill

## 2024-04-11 MED ORDER — REPATHA SURECLICK 140 MG/ML ~~LOC~~ SOAJ: 140.0000 mg | SUBCUTANEOUS | 12 refills | Status: AC

## 2024-04-11 NOTE — Telephone Encounter (Signed)
 Patient called to follow-up on the status of her Repatha  refill and note she will need this medication for her dosage on Sunday, 8/17.  Patient wants a call back to confirm medication has been sent to [id number] - [place], [place] - [address].

## 2024-04-20 DIAGNOSIS — D225 Melanocytic nevi of trunk: Secondary | ICD-10-CM | POA: Diagnosis not present

## 2024-04-20 DIAGNOSIS — Z1283 Encounter for screening for malignant neoplasm of skin: Secondary | ICD-10-CM | POA: Diagnosis not present

## 2024-04-26 ENCOUNTER — Ambulatory Visit
Admission: EM | Admit: 2024-04-26 | Discharge: 2024-04-26 | Disposition: A | Discharge status: Home / Self Care | Attending: Physician Assistant | Admitting: Physician Assistant

## 2024-04-26 ENCOUNTER — Ambulatory Visit: Payer: Self-pay

## 2024-04-26 ENCOUNTER — Ambulatory Visit (INDEPENDENT_AMBULATORY_CARE_PROVIDER_SITE_OTHER)

## 2024-04-26 ENCOUNTER — Encounter: Payer: Self-pay | Admitting: Emergency Medicine

## 2024-04-26 DIAGNOSIS — M79641 Pain in right hand: Secondary | ICD-10-CM

## 2024-04-26 DIAGNOSIS — W19XXXA Unspecified fall, initial encounter: Secondary | ICD-10-CM

## 2024-04-26 NOTE — ED Triage Notes (Signed)
 Pt presents c/o right hand pain x 1 days. Pt reports she was cleaning the bugs out of the pool and tripped on one of the loungers and fell. Pt used her hand to break the fall and now has pain in the right hand.  Pt denies LOC.

## 2024-04-26 NOTE — ED Provider Notes (Signed)
 [facility]    CSN: [id number] Arrival date & time: 04/26/24  1556      History   Chief Complaint Chief Complaint  Patient presents with   Fall   Hand Pain    HPI NADRA HRITZ is a 73 y.o. female.   Patient here today for evaluation of right hand pain after a fall today.  She states that she was trying to clean bugs out of her pool and tripped over one of her lounger's.  She made contact with her right hand as she was using it to catch herself.  She also sustained a bruise to her right upper leg and has abrasions to both knees.  She reports her hand pain is worsening with time.  She does not report head injury or LOC.  The history is provided by the patient.  Fall Pertinent negatives include no abdominal pain and no shortness of breath.  Hand Pain Pertinent negatives include no abdominal pain and no shortness of breath.    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   On continue HRT since 1980 per patient   Sphincter of Oddi dysfunction     Patient Active Problem List   Diagnosis Date Noted   Shingles 02/07/2024   Osteopenia 11/18/2023   Mild anemia 09/27/2023   Minimal CAD 04/05/2022   Fatty liver 09/11/2021   Right upper quadrant abdominal pain 09/11/2021   Insomnia 09/11/2021   Hyperlipidemia 08/06/2021   Vaccine reaction 08/06/2021   GAD (generalized anxiety disorder) 08/01/2021   Current use of proton  pump inhibitor 07/20/2021   Colon cancer screening 07/20/2021   Hot flashes 07/11/2020   Subclinical hypothyroidism 07/11/2020   Allergy to statin medication 07/11/2020   Medicare annual wellness visit, subsequent 07/10/2019   Paresthesia 04/06/2019   Foot pain, bilateral 04/06/2019   Vaginal atrophy 06/09/2018   Breast cancer screening 05/31/2017   Controlled type 2 diabetes mellitus without complication, without long-term current use of insulin (HCC) 06/02/2016   Estrogen deficiency 01/29/2015   Routine general medical examination at a health care facility 02/20/2012   GERD 06/20/2008    Past Surgical History:  Procedure Laterality Date   ABDOMINAL HYSTERECTOMY     APPENDECTOMY  1990   BREAST SURGERY  8030,8022, 1989   Multiple breat surgeries over the years   CARPAL TUNNEL RELEASE  08/2008   left   CHOLECYSTECTOMY     COSMETIC SURGERY  1989 & 2012   Subq mastectomies and incapacitation of implants   ERCP     HEEL SPUR RESECTION Left 09/29/2021   Procedure: LEFT HEEL SPUR EXCISION AND ACHILLES TENDON REPAIR;  Surgeon: Sheril Coy, MD;  Location: [facility];  Service: Orthopedics;  Laterality: Left;   LEFT HEART CATHETERIZATION WITH CORONARY ANGIOGRAM N/A 06/08/2013   Procedure: LEFT  HEART CATHETERIZATION WITH CORONARY ANGIOGRAM;  Surgeon: Mickey Aleene JINNY Alveta, MD;  Location: [facility] C[facility];  Service: Cardiovascular;  Laterality: N/A;   MASTECTOMY     bilateral for severe macrocystic breast   SPINE SURGERY N/A 02/12/2020   neck   TUBAL LIGATION      OB History     Gravida  3   Para  0   Term  0   Preterm  0   AB  1   Living  2      SAB  0   IAB  0   Ectopic  1   Multiple  0   Live Births  2            Home Medications    Prior to Admission medications   Medication Sig Start Date End Date Taking? Authorizing Provider  ALPRAZolam  (XANAX ) 1 MG tablet TAKE 1/2 TABLET BY MOUTH AT BEDTIME AS NEEDED FOR ANXIETY OR SLEEP 03/19/24   Tower, Laine LABOR, MD   ASPERCREME LIDOCAINE  EX Apply 1 application  topically at bedtime as needed (foot pain).    [provider]  aspirin  EC 81 MG tablet Take 81 mg by mouth daily. Swallow whole.    [provider]  Blood Glucose Monitoring Suppl (ACCU-CHEK GUIDE ME) w/Device KIT Use to check blood sugar once daily  (dx. E11.9) 01/02/24   Tower, Laine LABOR, MD  Calcium -Magnesium -Vitamin D  (CALCIUM  1200+D3 PO) Take 1 tablet by mouth at bedtime.    [provider]  Cholecalciferol (D3-1000 PO) Take 1 capsule by mouth daily.    [provider]  Cyanocobalamin  (B-12 PO) Take 1,000 mcg by mouth daily.    [provider]  cyclobenzaprine  (FLEXERIL ) 10 MG tablet TAKE 0.5 TABLETS BY MOUTH AT BEDTIME AS NEEDED FOR MUSCLE SPASMS (BACK PAIN). CAUTION OF SEDATION 04/10/24   Tower, Laine LABOR, MD  diclofenac  Sodium (VOLTAREN ) 1 % GEL APPLY 4 GRAMS TO AFFECTED AREA 4 TIMES DAILY AS NEEDED 05/27/22   Tower, Laine LABOR, MD  Evolocumab  (REPATHA  SURECLICK) 140 MG/ML SOAJ Inject 140 mg into the skin every 14 (fourteen) days. 04/11/24   Tobb, Kardie, DO  Glucose Blood (BLOOD GLUCOSE TEST STRIPS) STRP 1 each by In Vitro route daily. May substitute to any manufacturer covered by patient's insurance. 10/31/23   Tower, Laine LABOR, MD  Lancet Device MISC 1 each by Does not apply route daily. May substitute to any manufacturer covered by patient's insurance. 10/31/23   Tower, Laine LABOR, MD  Lancets Misc. MISC 1 each by Does not apply route daily. May substitute to any manufacturer covered by patient's insurance. 10/31/23   Tower, Laine LABOR, MD  Melatonin 10 MG CAPS Take 10 mg by mouth at bedtime.    [provider]  metFORMIN  (GLUCOPHAGE -XR) 500 MG 24 hr tablet Take 1 tablet (500 mg total) by mouth daily with breakfast. 02/07/24   Tower, Laine LABOR, MD  metoprolol  succinate (TOPROL -XL) 25 MG 24 hr tablet Take 0.5 tablets (12.5 mg total) by mouth daily. 04/14/23   Tobb, Kardie, DO  Multiple Vitamins-Minerals (EYE VITAMINS  PO) Take 1 capsule by mouth daily.    [provider]  pantoprazole  (PROTONIX ) 40 MG tablet TAKE 1 TABLET BY MOUTH TWICE  DAILY 09/05/23   Tower, Laine LABOR, MD  triamcinolone  cream (KENALOG ) 0.1 % APPLY 1 APPLICATION TOPICALLY 2 (TWO) TIMES DAILY AS NEEDED. RASH 08/25/21   Tower, Laine LABOR, MD  valACYclovir  (VALTREX ) 1000 MG tablet Take 1 tablet (1,000  mg total) by mouth 3 (three) times daily. 02/06/24   Tower, Laine LABOR, MD    Family History Family History  Problem Relation Age of Onset   Brain cancer Mother        hospice   Diabetes Mother    Hypertension Mother    Varicose Veins Mother    Diabetes Father    Hypertension Father    Cancer Father        prostate and leukemia   Hearing loss Father    Breast cancer Sister 80   Cancer Sister    Cancer Maternal Aunt        Pancreatic   Diabetes Maternal Uncle    Heart disease Maternal Uncle    Heart disease Maternal Grandmother    Hyperlipidemia Maternal Grandmother    Stroke Maternal Grandmother    Obesity Maternal Grandfather    Cancer Sister     Social History Social History   Tobacco Use   Smoking status: Never    Passive exposure: Never   Smokeless tobacco: Never  Vaping Use   Vaping status: Never Used  Substance Use Topics   Alcohol use: Yes    Alcohol/week: 1.0 standard drink of alcohol    Types: 1 Shots of liquor per week    Comment: More like once a month if that   Drug use: No     Allergies   Atorvastatin, Ezetimibe, Metformin  and related, Polyoxyethylene 40 sorbitol septaoleate [sorbitan], Prednisone , Rosuvastatin , Shingrix [zoster vac recomb adjuvanted], and Sulfamethoxazole-trimethoprim   Review of Systems Review of Systems  Constitutional:  Negative for chills and fever.  Eyes:  Negative for discharge and redness.  Respiratory:  Negative for shortness of breath.   Gastrointestinal:  Negative for abdominal pain, nausea and vomiting.  Skin:  Positive for color change.  Neurological:  Negative for  numbness.     Physical Exam Triage Vital Signs ED Triage Vitals  Encounter Vitals Group     BP 04/26/24 1631 131/82     Girls Systolic BP Percentile --      Girls Diastolic BP Percentile --      Boys Systolic BP Percentile --      Boys Diastolic BP Percentile --      Pulse Rate 04/26/24 1631 80     Resp 04/26/24 1631 18     Temp 04/26/24 1631 98.5 F (36.9 C)     Temp Source 04/26/24 1631 Oral     SpO2 04/26/24 1631 96 %     Weight 04/26/24 1630 169 lb 5 oz (76.8 kg)     Height --      Head Circumference --      Peak Flow --      Pain Score 04/26/24 1629 6     Pain Loc --      Pain Education --      Exclude from Growth Chart --    No data found.  Updated Vital Signs BP 131/82 (BP Location: Left Arm)   Pulse 80   Temp 98.5 F (36.9 C) (Oral)   Resp 18   Wt 169 lb 5 oz (76.8 kg)   SpO2 96%   BMI 29.06 kg/m   Visual Acuity Right Eye Distance:   Left Eye Distance:   Bilateral Distance:    Right Eye Near:   Left Eye Near:    Bilateral Near:     Physical Exam Vitals and nursing note reviewed.  Constitutional:      General: She is  not in acute distress.    Appearance: Normal appearance. She is not ill-appearing.  HENT:     Head: Normocephalic and atraumatic.  Eyes:     Conjunctiva/sclera: Conjunctivae normal.  Cardiovascular:     Rate and Rhythm: Normal rate.  Pulmonary:     Effort: Pulmonary effort is normal. No respiratory distress.  Musculoskeletal:     Comments: Full range of motion of right wrist.  Mild decreased range of motion of right fifth finger but normal range of motion of other right fingers.  Tenderness and mild swelling appreciated to distal right fifth metacarpal.   Skin:    Comments: Round bruise approximately 3 cm wide noted to mid right lateral thigh.,  Small, nonbleeding, superficial abrasions noted to bilateral knees.  Neurological:     Mental Status: She is alert.  Psychiatric:        Mood and Affect: Mood normal.        Behavior:  Behavior normal.        Thought Content: Thought content normal.      UC Treatments / Results  Labs (all labs ordered are listed, but only abnormal results are displayed) Labs Reviewed - No data to display  EKG   Radiology DG Hand Complete Right Result Date: 04/26/2024 CLINICAL DATA:  Right hand pain after fall yesterday. EXAM: RIGHT HAND - COMPLETE 3+ VIEW COMPARISON:  None Available. FINDINGS: There is no evidence of fracture or dislocation. There is no evidence of arthropathy or other focal bone abnormality. Soft tissues are unremarkable. IMPRESSION: Negative. Electronically Signed   By: Lynwood Landy Raddle M.D.   On: 04/26/2024 16:54    Procedures Procedures (including critical care time)  Medications Ordered in UC Medications - No data to display  Initial Impression / Assessment and Plan / UC Course  I have reviewed the triage vital signs and the nursing notes.  Pertinent labs & imaging results that were available during my care of the patient were reviewed by me and considered in my medical decision making (see chart for details).    X-ray of hand without fracture.  Recommended symptomatic treatment, ice, elevation.  Encouraged follow-up if no gradual improvement over the next few days or sooner with any worsening.  Patient expressed understanding.  Final Clinical Impressions(s) / UC Diagnoses   Final diagnoses:  Fall, initial encounter  Right hand pain   Discharge Instructions   None    ED Prescriptions   None    PDMP not reviewed this encounter.   Billy Asberry FALCON, PA-C 04/26/24 1742

## 2024-04-26 NOTE — Telephone Encounter (Signed)
  FYI Only or Action Required?: FYI only for provider.  Patient was last seen in primary care on 02/07/2024 by Randeen Laine LABOR, MD.  Called Nurse Triage reporting Hand Injury.  Symptoms began today.  Interventions attempted: Nothing.  Symptoms are: unchanged.  Triage Disposition: See Physician Within 24 Hours  Patient/caregiver understands and will follow disposition?: Yes   Copied from CRM #[id number]. Topic: Clinical - Red Word Triage >> Apr 26, 2024  3:20 PM Jasmin G wrote: [facility] that prompted transfer to Nurse Triage: Pt states that she fell on concrete today and fell on her right hand, one of her fingertips got swollen shortly after that, she can move her hand but can't lay it down flat. Reason for Disposition  [1] MODERATE pain (e.g., interferes with normal activities) AND [2] high-risk adult (e.g., age > 60 years, osteoporosis, chronic steroid use)  Answer Assessment - Initial Assessment Questions 1. MECHANISM: How did the injury happen?     Fell on cement and injured right hand, little finger 2. ONSET: When did the injury happen? (e.g., minutes, hours ago)      today 3. APPEARANCE of INJURY: What does the injury look like?      Swelling, pain 4. SEVERITY: Can you use your hand normally? Can you bend your fingers into a ball and then fully open them?     Cannot lay it flat 5. SIZE: For cuts, bruises, or swelling, ask: How large is it? (e.g., inches or centimeters; entire hand)      na 6. PAIN: How bad is the pain? (Scale 0-10; or none, mild, moderate, severe)     moderate 7. TETANUS: For any breaks in the skin, ask: When was your last tetanus booster?     no 8. OTHER SYMPTOMS: Do you have any other symptoms?      no 9. PREGNANCY: Is there any chance you are pregnant? When was your last menstrual period?     na  Protocols used: Hand Injury-A-AH

## 2024-04-26 NOTE — Telephone Encounter (Signed)
 Agree with UC disposition as I am out of the office  Will watch for correspondence

## 2024-05-08 ENCOUNTER — Other Ambulatory Visit: Payer: Self-pay

## 2024-05-08 DIAGNOSIS — Z23 Encounter for immunization: Secondary | ICD-10-CM

## 2024-05-08 MED ORDER — COVID-19 MRNA VAC-TRIS(PFIZER) 30 MCG/0.3ML IM SUSY: 0.3000 mL | PREFILLED_SYRINGE | Freq: Once | INTRAMUSCULAR | 0 refills | Status: AC

## 2024-05-08 NOTE — Telephone Encounter (Signed)
 Copied from CRM [id number]. Topic: Clinical - Medication Question >> May 08, 2024 11:35 AM Deleta RAMAN wrote: Reason for CRM: patient is requesting covid vaccine   Ok to send as pended

## 2024-05-09 ENCOUNTER — Encounter: Payer: Self-pay | Admitting: Cardiology

## 2024-05-09 ENCOUNTER — Ambulatory Visit: Attending: Cardiovascular Disease | Admitting: Cardiology

## 2024-05-09 VITALS — BP 160/72 | HR 67 | Ht 64.0 in | Wt 171.0 lb

## 2024-05-09 DIAGNOSIS — R002 Palpitations: Secondary | ICD-10-CM | POA: Diagnosis not present

## 2024-05-09 DIAGNOSIS — E781 Pure hyperglyceridemia: Secondary | ICD-10-CM

## 2024-05-09 DIAGNOSIS — R7303 Prediabetes: Secondary | ICD-10-CM

## 2024-05-09 MED ORDER — COVID-19 MRNA VACC (MODERNA) 50 MCG/0.5ML IM SUSP: 0.5000 mL | Freq: Once | INTRAMUSCULAR | 0 refills | Status: AC

## 2024-05-09 NOTE — Patient Instructions (Signed)
 Medication Instructions:  Your physician recommends that you continue on your current medications as directed. Please refer to the Current Medication list given to you today.  *If you need a refill on your cardiac medications before your next appointment, please call your pharmacy*  Lab Work: Lipds, HgbA1c If you have labs (blood work) drawn today and your tests are completely normal, you will receive your results only by: MyChart Message (if you have MyChart) OR A paper copy in the mail If you have any lab test that is abnormal or we need to change your treatment, we will call you to review the results.   Follow-Up: At [facility], you and your health needs are our priority.  As part of our continuing mission to provide you with exceptional heart care, our providers are all part of one team.  This team includes your primary Cardiologist (physician) and Advanced Practice Providers or APPs (Physician Assistants and Nurse Practitioners) who all work together to provide you with the care you need, when you need it.  Your next appointment:   1 year(s)  Provider:   Kardie Tobb, DO    Other Instructions Please take your blood pressure daily for 1 week and send in a MyChart message. Please include heart rates. (One message at the end of the 1 week).   HOW TO TAKE YOUR BLOOD PRESSURE: Rest 5 minutes before taking your blood pressure. Don't smoke or drink caffeinated beverages for at least 30 minutes before. Take your blood pressure before (not after) you eat. Sit comfortably with your back supported and both feet on the floor (don't cross your legs). Elevate your arm to heart level on a table or a desk. Use the proper sized cuff. It should fit smoothly and snugly around your bare upper arm. There should be enough room to slip a fingertip under the cuff. The bottom edge of the cuff should be 1 inch above the crease of the elbow. Ideally, take 3 measurements at one sitting and record  the average.

## 2024-05-09 NOTE — Telephone Encounter (Unsigned)
 Copied from CRM [id number]. Topic: Clinical - Medication Question >> May 08, 2024 11:35 AM Deleta RAMAN wrote: Reason for CRM: patient is requesting covid vaccine >> May 09, 2024  2:26 PM Chiquita SQUIBB wrote: Patient is calling in asking if this can be sent to [facility] on [address] in [place] [phone]. Please advise patient if this can be sent there.

## 2024-05-09 NOTE — Progress Notes (Unsigned)
 Cardiology Office Note:    Date:  05/09/2024   ID:  [name], DOB 1951/04/18, MRN [id number]  PCP:  Randeen Laine LABOR, MD  Cardiologist:  Dub Huntsman, DO  Electrophysiologist:  None   Referring MD: Randeen Laine LABOR, MD    I am doing fine  History of Present Illness:    [name] is a 73 y.o. 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  She shared she recently experienced a fall while cleaning the pool, resulting in a hematoma on her leg. The hematoma has progressed from a bruise to a palpable mass with skin changes. Her blood pressure is not typically elevated at home, but she is monitoring it closely. She has a history of a transient ischemic attack. She discontinued Tylenol  due to liver discomfort, with no current liver pain.  She engages in regular physical activity, including swimming, walking 10,000 steps daily, and line dancing weekly. She is on metformin  for type 2 diabetes, with her last A1c being 6.8% in November. Her triglycerides were elevated last year, but she has not had recent lipid testing.  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   On continue HRT since 1980 per patient   Sphincter of Oddi dysfunction     Past Surgical History:  Procedure Laterality Date   ABDOMINAL HYSTERECTOMY     APPENDECTOMY  1990    BREAST SURGERY  8030,8022, 1989   Multiple breat surgeries over the years   CARPAL TUNNEL RELEASE  08/2008   left   CHOLECYSTECTOMY     COSMETIC SURGERY  1989 & 2012   Subq mastectomies and incapacitation of implants   ERCP     HEEL SPUR RESECTION Left 09/29/2021   Procedure: LEFT HEEL SPUR EXCISION AND ACHILLES TENDON REPAIR;  Surgeon: Sheril Coy, MD;  Location: [facility];  Service: Orthopedics;  Laterality: Left;   LEFT HEART CATHETERIZATION WITH CORONARY ANGIOGRAM N/A 06/08/2013   Procedure: LEFT HEART CATHETERIZATION WITH CORONARY ANGIOGRAM;  Surgeon: Mickey Aleene JINNY Alveta, MD;  Location: [facility] CATH LAB;  Service: Cardiovascular;  Laterality: N/A;   MASTECTOMY     bilateral for severe macrocystic breast   SPINE SURGERY N/A 02/12/2020   neck   TUBAL LIGATION      Current Medications: Current Meds  Medication Sig   ALPRAZolam  (XANAX ) 1 MG tablet TAKE 1/2 TABLET BY MOUTH AT BEDTIME AS NEEDED FOR ANXIETY OR SLEEP   ASPERCREME LIDOCAINE  EX Apply 1 application  topically at bedtime as needed (foot pain).   aspirin  EC 81 MG tablet Take 81 mg by mouth daily. Swallow whole.   Blood Glucose Monitoring Suppl (ACCU-CHEK GUIDE ME) w/Device KIT Use to check blood sugar once daily  (dx. E11.9)  Calcium -Magnesium -Vitamin D  (CALCIUM  1200+D3 PO) Take 1 tablet by mouth at bedtime.   Cholecalciferol (D3-1000 PO) Take 1 capsule by mouth daily.   Cyanocobalamin  (B-12 PO) Take 1,000 mcg by mouth daily.   cyclobenzaprine  (FLEXERIL ) 10 MG tablet TAKE 0.5 TABLETS BY MOUTH AT BEDTIME AS NEEDED FOR MUSCLE SPASMS (BACK PAIN). CAUTION OF SEDATION   diclofenac  Sodium (VOLTAREN ) 1 % GEL APPLY 4 GRAMS TO AFFECTED AREA 4 TIMES DAILY AS NEEDED   Evolocumab  (REPATHA  SURECLICK) 140 MG/ML SOAJ Inject 140 mg into the skin every 14 (fourteen) days.   Glucose Blood (BLOOD GLUCOSE TEST STRIPS) STRP 1 each by In Vitro route daily. May substitute to any manufacturer covered by patient's insurance.   Lancet Device MISC 1 each  by Does not apply route daily. May substitute to any manufacturer covered by patient's insurance.   Lancets Misc. MISC 1 each by Does not apply route daily. May substitute to any manufacturer covered by patient's insurance.   Melatonin 10 MG CAPS Take 10 mg by mouth at bedtime.   metFORMIN  (GLUCOPHAGE -XR) 500 MG 24 hr tablet Take 1 tablet (500 mg total) by mouth daily with breakfast.   metoprolol  succinate (TOPROL -XL) 25 MG 24 hr tablet Take 0.5 tablets (12.5 mg total) by mouth daily.   Multiple Vitamins-Minerals (EYE VITAMINS PO) Take 1 capsule by mouth daily.   pantoprazole  (PROTONIX ) 40 MG tablet TAKE 1 TABLET BY MOUTH TWICE  DAILY   triamcinolone  cream (KENALOG ) 0.1 % APPLY 1 APPLICATION TOPICALLY 2 (TWO) TIMES DAILY AS NEEDED. RASH   valACYclovir  (VALTREX ) 1000 MG tablet Take 1 tablet (1,000 mg total) by mouth 3 (three) times daily.     Allergies:   Atorvastatin, Ezetimibe, Metformin  and related, Polyoxyethylene 40 sorbitol septaoleate [sorbitan], Prednisone , Rosuvastatin , Shingrix [zoster vac recomb adjuvanted], and Sulfamethoxazole-trimethoprim   Social History   Socioeconomic History   Marital status: Married    Spouse name: Not on file   Number of children: Not on file   Years of education: Not on file   Highest education level: Associate degree: occupational, Scientist, product/process development, or vocational program  Occupational History   Not on file  Tobacco Use   Smoking status: Never    Passive exposure: Never   Smokeless tobacco: Never  Vaping Use   Vaping status: Never Used  Substance and Sexual Activity   Alcohol use: Yes    Alcohol/week: 1.0 standard drink of alcohol    Types: 1 Shots of liquor per week    Comment: More like once a month if that   Drug use: No   Sexual activity: Yes    Birth control/protection: Surgical    Comment: Hysterectomy  Other Topics Concern   Not on file  Social History Narrative   Not on file   Social Drivers of Health   Financial Resource Strain: Low  Risk  (10/27/2023)   Overall Financial Resource Strain (CARDIA)    Difficulty of Paying Living Expenses: Not very hard  Food Insecurity: No Food Insecurity (10/27/2023)   Hunger Vital Sign    Worried About Running Out of Food in the Last Year: Never true    Ran Out of Food in the Last Year: Never true  Transportation Needs: No Transportation Needs (10/27/2023)   PRAPARE - Administrator, Civil Service (Medical): No    Lack of Transportation (Non-Medical): No  Physical Activity: Insufficiently Active (10/27/2023)   Exercise Vital Sign    Days of Exercise per Week: 4 days    Minutes of  Exercise per Session: 30 min  Stress: Stress Concern Present (10/27/2023)   [employer] of Occupational Health - Occupational Stress Questionnaire    Feeling of Stress : To some extent  Social Connections: Socially Integrated (10/27/2023)   Social Connection and Isolation Panel    Frequency of Communication with Friends and Family: Three times a week    Frequency of Social Gatherings with Friends and Family: Once a week    Attends Religious Services: More than 4 times per year    Active Member of [employer] or Organizations: Yes    Attends Engineer, structural: More than 4 times per year    Marital Status: Married     Family History: The patient's family history includes Brain cancer in her mother; Breast cancer (age of onset: 25) in her sister; Cancer in her father, maternal aunt, sister, and sister; Diabetes in her father, maternal uncle, and mother; Hearing loss in her father; Heart disease in her maternal grandmother and maternal uncle; Hyperlipidemia in her maternal grandmother; Hypertension in her father and mother; Obesity in her maternal grandfather; Stroke in her maternal grandmother; Varicose Veins in her mother.  ROS:   Review of Systems  Constitution: Negative for decreased appetite, fever and weight gain.  HENT: Negative for congestion, ear discharge, hoarse voice and sore  throat.   Eyes: Negative for discharge, redness, vision loss in right eye and visual halos.  Cardiovascular: Negative for chest pain, dyspnea on exertion, leg swelling, orthopnea and palpitations.  Respiratory: Negative for cough, hemoptysis, shortness of breath and snoring.   Endocrine: Negative for heat intolerance and polyphagia.  Hematologic/Lymphatic: Negative for bleeding problem. Does not bruise/bleed easily.  Skin: Negative for flushing, nail changes, rash and suspicious lesions.  Musculoskeletal: Negative for arthritis, joint pain, muscle cramps, myalgias, neck pain and stiffness.  Gastrointestinal: Negative for abdominal pain, bowel incontinence, diarrhea and excessive appetite.  Genitourinary: Negative for decreased libido, genital sores and incomplete emptying.  Neurological: Negative for brief paralysis, focal weakness, headaches and loss of balance.  Psychiatric/Behavioral: Negative for altered mental status, depression and suicidal ideas.  Allergic/Immunologic: Negative for HIV exposure and persistent infections.    EKGs/Labs/Other Studies Reviewed:    The following studies were reviewed today:   EKG: Sinus rhythm, heart rate 61 bpm with underlying left bundle branch block.  CCTA 07/2021   Coronary Arteries:  Normal coronary origin.  Right dominance.   RCA is a large dominant artery that gives rise to PDA and PLA. There is no plaque.   Left main is a large artery that gives rise to LAD and LCX arteries. Noncalcified plaque in left main causes minimal (0-24%) stenosis   LAD is a large vessel. Noncalcified plaque in proximal LAD causes minimal (0-24%) stenosis   LCX is a non-dominant artery that gives rise to one large OM1 branch. Noncalcified plaque in proximal LCX causes minimal (0-24%) stenosis   Other findings:   Left Ventricle: Normal size   Left Atrium: Normal size   Pulmonary Veins: Normal configuration   Right Ventricle: Normal size   Right  Atrium: Normal size   Cardiac valves: No calcifications   Thoracic aorta: Normal size   Pulmonary Arteries: Normal size   Systemic Veins: Normal drainage   Pericardium: Normal thickness   IMPRESSION: 1.  Coronary calcium  score of 0.   2.  Normal coronary origin with right dominance.   3.  Nonobstructive CAD   4. Noncalcified plaque in left main, proximal LAD, and proximal LCX causes minimal (  0-24%) stenosis   CAD-RADS 1. Minimal non-obstructive CAD (0-24%). Consider non-atherosclerotic causes of chest pain. Consider preventive therapy and risk factor modification.     Electronically Signed   By: Lonni Nanas M.D.   On: 08/20/2021 22:07  Recent Labs: 07/25/2023: TSH 2.88 10/31/2023: ALT 29; BUN 13; Creatinine, Ser 0.58; Hemoglobin 12.2; Platelets 314.0; Potassium 4.5; Sodium 141  Recent Lipid Panel    Component Value Date/Time   CHOL 158 07/25/2023 0902   TRIG 182.0 (H) 07/25/2023 0902   HDL 64.90 07/25/2023 0902   CHOLHDL 2 07/25/2023 0902   VLDL 36.4 07/25/2023 0902   LDLCALC 57 07/25/2023 0902   LDLDIRECT 97.0 06/06/2018 0812    Physical Exam:    VS:  BP (!) 160/72 (BP Location: Left Arm, Patient Position: Sitting, Cuff Size: Normal)   Pulse 67   Ht 5' 4 (1.626 m)   Wt 171 lb (77.6 kg)   SpO2 93%   BMI 29.35 kg/m     Wt Readings from Last 3 Encounters:  05/09/24 171 lb (77.6 kg)  04/26/24 169 lb 5 oz (76.8 kg)  02/07/24 169 lb 6 oz (76.8 kg)     GEN: Well nourished, well developed in no acute distress HEENT: Normal NECK: No JVD; No carotid bruits LYMPHATICS: No lymphadenopathy CARDIAC: S1S2 noted,RRR, no murmurs, rubs, gallops RESPIRATORY:  Clear to auscultation without rales, wheezing or rhonchi  ABDOMEN: Soft, non-tender, non-distended, +bowel sounds, no guarding. EXTREMITIES: No edema, No cyanosis, no clubbing MUSCULOSKELETAL:  No deformity  SKIN: Warm and dry NEUROLOGIC:  Alert and oriented x 3, non-focal PSYCHIATRIC:  Normal  affect, good insight  ASSESSMENT:    1. Palpitations    PLAN:    Minimal coronary artery disease-she is on aspirin  81 mg daily.  She is currently on Repatha  and has been doing well on this.  She is concerned about not being able to afford it in the coming months.  Hypertension Blood pressure elevated at 138/84 mmHg. Concern for persistently elevated levels due to TIA history. - Monitor blood pressure at home and report if persistently high. - Call in blood pressure readings after one week.  Type 2 diabetes mellitus Last A1c was 6.8% in November. - Check A1c today.  Hypertriglyceridemia Last known value 182 mg/dL. - Order lipid panel to assess triglyceride levels.  Recent fall with left lower leg contusion and abrasions Contusion and abrasions on left lower leg with hematoma. Healing expected to take time.  The patient is in agreement with the above plan. The patient left the office in stable condition.  The patient will follow up in   Medication Adjustments/Labs and Tests Ordered: Current medicines are reviewed at length with the patient today.  Concerns regarding medicines are outlined above.  Orders Placed This Encounter  Procedures   EKG 12-Lead   No orders of the defined types were placed in this encounter.   There are no Patient Instructions on file for this visit.   Adopting a Healthy Lifestyle.  Know what a healthy weight is for you (roughly BMI <25) and aim to maintain this   Aim for 7+ servings of fruits and vegetables daily   65-80+ fluid ounces of water or unsweet tea for healthy kidneys   Limit to max 1 drink of alcohol per day; avoid smoking/tobacco   Limit animal fats in diet for cholesterol and heart health - choose grass fed whenever available   Avoid highly processed foods, and foods high in saturated/trans fats   Aim  for low stress - take time to unwind and care for your mental health   Aim for 150 min of moderate intensity exercise weekly for  heart health, and weights twice weekly for bone health   Aim for 7-9 hours of sleep daily   When it comes to diets, agreement about the perfect plan isnt easy to find, even among the experts. Experts at the [facility] of [employer] developed an idea known as the Healthy Eating Plate. Just imagine a plate divided into logical, healthy portions.   The emphasis is on diet quality:   Load up on vegetables and fruits - one-half of your plate: Aim for color and variety, and remember that potatoes dont count.   Go for whole grains - one-quarter of your plate: Whole wheat, barley, wheat berries, quinoa, oats, brown rice, and foods made with them. If you want pasta, go with whole wheat pasta.   Protein power - one-quarter of your plate: Fish, chicken, beans, and nuts are all healthy, versatile protein sources. Limit red meat.   The diet, however, does go beyond the plate, offering a few other suggestions.   Use healthy plant oils, such as olive, canola, soy, corn, sunflower and peanut. Check the labels, and avoid partially hydrogenated oil, which have unhealthy trans fats.   If youre thirsty, drink water. Coffee and tea are good in moderation, but skip sugary drinks and limit milk and dairy products to one or two daily servings.   The type of carbohydrate in the diet is more important than the amount. Some sources of carbohydrates, such as vegetables, fruits, whole grains, and beans-are healthier than others.   Finally, stay active  Signed, Dub Huntsman, DO  05/09/2024 11:23 AM    [facility]

## 2024-05-09 NOTE — Addendum Note (Signed)
 Addended by: RANDEEN HARDY A on: 05/09/2024 03:43 PM   Modules accepted: Orders

## 2024-05-10 LAB — LIPID PANEL
Chol/HDL Ratio: 1.9 ratio (ref 0.0–4.4)
Cholesterol, Total: 129 mg/dL (ref 100–199)
HDL: 68 mg/dL (ref >39)
LDL Chol Calc (NIH): 29 mg/dL (ref 0–99)
Triglycerides: 206 mg/dL — ABNORMAL HIGH (ref 0–149)
VLDL Cholesterol Cal: 32 mg/dL (ref 5–40)

## 2024-05-10 LAB — HEMOGLOBIN A1C
Est. average glucose Bld gHb Est-mCnc: 140 mg/dL
Hgb A1c MFr Bld: 6.5 % — ABNORMAL HIGH (ref 4.8–5.6)

## 2024-05-16 ENCOUNTER — Telehealth: Payer: Self-pay | Admitting: Pharmacy Technician

## 2024-05-16 ENCOUNTER — Encounter: Payer: Self-pay | Admitting: Cardiology

## 2024-05-18 ENCOUNTER — Telehealth: Payer: Self-pay | Admitting: Cardiology

## 2024-05-18 MED ORDER — METOPROLOL SUCCINATE ER 25 MG PO TB24: 12.5000 mg | ORAL_TABLET | Freq: Every day | ORAL | 3 refills | Status: AC

## 2024-05-18 NOTE — Telephone Encounter (Signed)
*  STAT* If patient is at the pharmacy, call can be transferred to refill team.   1. Which medications need to be refilled? (please list name of each medication and dose if known) metoprolol  succinate (TOPROL -XL) 25 MG 24 hr tablet    2. Would you like to learn more about the convenience, safety, & potential cost savings by using the [facility]?     3. Are you open to using the [facility] (Type [facility].  ).   4. Which pharmacy/location (including street and city if local pharmacy) is medication to be sent to?  [id number] - [place], [place] - [address]     5. Do they need a 30 day or 90 day supply? 90 day

## 2024-05-18 NOTE — Telephone Encounter (Signed)
 Pt's medication was sent to pt's pharmacy as requested. Confirmation received.

## 2024-06-12 ENCOUNTER — Ambulatory Visit: Payer: Self-pay | Admitting: Cardiology

## 2024-06-12 ENCOUNTER — Encounter: Payer: Self-pay | Admitting: Pharmacist

## 2024-06-12 NOTE — Progress Notes (Signed)
 Pharmacy Quality Measure Review  Statin Use in Persons with Diabetes (SUPD)  Prior report of side effect on statin medication.  Not addressed in 2025.   ICD-10-CM code exceptions:  Rhabdomyolysis or myopathy T46.6X5A      Adverse effect of antihyperlipidemic and antiarteriosclerotic drugs  Upcoming PCP visit Dec. Future message sent to consider addressing with above ICD10 code. Visit note updated.   Future Appointments  Date Time Provider Department Center  07/25/2024  8:45 AM [facility] LAB [facility] [address]  08/01/2024 10:00 AM Tower, Laine LABOR, MD [facility] [address]  09/27/2024 10:10 AM [facility] ANNUAL WELLNESS VISIT 1 [facility] [address]

## 2024-06-19 NOTE — Progress Notes (Signed)
 [name]                                          MRN: [id number]   06/19/2024   The [facility] Quality Team Specialist reviewed this patient medical record for the purposes of chart review for care gap closure. The following were reviewed: chart review for care gap closure-kidney health evaluation for diabetes:eGFR  and uACR.    [facility] Quality Team

## 2024-06-19 NOTE — Progress Notes (Signed)
 [name]                                          MRN: [id number]   06/19/2024   The [facility] Quality Team Specialist reviewed this patient medical record for the purposes of chart review for care gap closure. The following were reviewed: abstraction for care gap closure-glycemic status assessment.    [facility] Quality Team

## 2024-06-20 MED ORDER — ICOSAPENT ETHYL 0.5 G PO CAPS: 4.0000 | ORAL_CAPSULE | Freq: Two times a day (BID) | ORAL | 3 refills | Status: DC

## 2024-06-20 NOTE — Telephone Encounter (Signed)
 Patient returned RN's call regarding results.

## 2024-07-02 NOTE — Telephone Encounter (Signed)
 Pt c/o medication issue:  1. Name of Medication: Icosapent Ethyl (VASCEPA) 0.5 g CAPS   2. How are you currently taking this medication (dosage and times per day)? Stopped the medication on Friday, was taking it as Take 4 capsules (2 g total) by mouth 2 (two) times daily.   3. Are you having a reaction (difficulty breathing--STAT)? No, but she had SOB while taking the medication.  4. What is your medication issue? Patient stated she felt awful. Patient stated she was having SOB and weakness in her arms. Patient stopped the medication on Friday and felt better once off the medication. Please advise.

## 2024-07-16 ENCOUNTER — Other Ambulatory Visit: Payer: Self-pay | Admitting: Family Medicine

## 2024-07-17 NOTE — Telephone Encounter (Signed)
 Medication: Xanax  Directions: TAKE 1/2 TABLET BY MOUTH AT BEDTIME AS NEEDED FOR ANXIETY OR SLEEP,  Last given: 03/19/2024 Number refills: 3 Last o/v:  Follow up:  Labs:    Please review refill request.

## 2024-07-22 ENCOUNTER — Telehealth: Payer: Self-pay | Admitting: Family Medicine

## 2024-07-22 DIAGNOSIS — E119 Type 2 diabetes mellitus without complications: Secondary | ICD-10-CM

## 2024-07-22 DIAGNOSIS — Z79899 Other long term (current) drug therapy: Secondary | ICD-10-CM

## 2024-07-22 DIAGNOSIS — E038 Other specified hypothyroidism: Secondary | ICD-10-CM

## 2024-07-22 DIAGNOSIS — D649 Anemia, unspecified: Secondary | ICD-10-CM

## 2024-07-22 DIAGNOSIS — K760 Fatty (change of) liver, not elsewhere classified: Secondary | ICD-10-CM

## 2024-07-22 DIAGNOSIS — E7800 Pure hypercholesterolemia, unspecified: Secondary | ICD-10-CM

## 2024-07-22 NOTE — Telephone Encounter (Signed)
-----   Message from Veva JINNY Ferrari sent at 07/10/2024  2:38 PM EST ----- Regarding: Lab orders for Wed, 11.26.25 Patient is scheduled for CPX labs, please order future labs, Thanks , Terri  Has a few older orders, just checking to see if you needed more

## 2024-07-25 ENCOUNTER — Other Ambulatory Visit

## 2024-07-25 DIAGNOSIS — E7800 Pure hypercholesterolemia, unspecified: Secondary | ICD-10-CM

## 2024-07-25 DIAGNOSIS — D649 Anemia, unspecified: Secondary | ICD-10-CM

## 2024-07-25 DIAGNOSIS — K760 Fatty (change of) liver, not elsewhere classified: Secondary | ICD-10-CM

## 2024-07-25 DIAGNOSIS — E038 Other specified hypothyroidism: Secondary | ICD-10-CM | POA: Diagnosis not present

## 2024-07-25 DIAGNOSIS — Z79899 Other long term (current) drug therapy: Secondary | ICD-10-CM

## 2024-07-25 DIAGNOSIS — E119 Type 2 diabetes mellitus without complications: Secondary | ICD-10-CM

## 2024-07-25 LAB — CBC WITH DIFFERENTIAL/PLATELET
Basophils Absolute: 0 K/uL (ref 0.0–0.1)
Basophils Relative: 0.8 % (ref 0.0–3.0)
Eosinophils Absolute: 0.2 K/uL (ref 0.0–0.7)
Eosinophils Relative: 3.9 % (ref 0.0–5.0)
HCT: 35.3 % — ABNORMAL LOW (ref 36.0–46.0)
Hemoglobin: 11.3 g/dL — ABNORMAL LOW (ref 12.0–15.0)
Lymphocytes Relative: 37.3 % (ref 12.0–46.0)
Lymphs Abs: 2.1 K/uL (ref 0.7–4.0)
MCHC: 31.8 g/dL (ref 30.0–36.0)
MCV: 73.3 fl — ABNORMAL LOW (ref 78.0–100.0)
Monocytes Absolute: 0.5 K/uL (ref 0.1–1.0)
Monocytes Relative: 9.4 % (ref 3.0–12.0)
Neutro Abs: 2.8 K/uL (ref 1.4–7.7)
Neutrophils Relative %: 48.6 % (ref 43.0–77.0)
Platelets: 328 K/uL (ref 150.0–400.0)
RBC: 4.82 Mil/uL (ref 3.87–5.11)
RDW: 15.6 % — ABNORMAL HIGH (ref 11.5–15.5)
WBC: 5.7 K/uL (ref 4.0–10.5)

## 2024-07-25 LAB — LIPID PANEL
Cholesterol: 125 mg/dL (ref 0–200)
HDL: 69.4 mg/dL (ref >39.00)
LDL Cholesterol: 34 mg/dL (ref 0–99)
NonHDL: 55.62
Total CHOL/HDL Ratio: 2
Triglycerides: 110 mg/dL (ref 0.0–149.0)
VLDL: 22 mg/dL (ref 0.0–40.0)

## 2024-07-25 LAB — COMPREHENSIVE METABOLIC PANEL WITH GFR
ALT: 34 U/L (ref 0–35)
AST: 26 U/L (ref 0–37)
Albumin: 4.5 g/dL (ref 3.5–5.2)
Alkaline Phosphatase: 62 U/L (ref 39–117)
BUN: 15 mg/dL (ref 6–23)
CO2: 29 meq/L (ref 19–32)
Calcium: 9.7 mg/dL (ref 8.4–10.5)
Chloride: 104 meq/L (ref 96–112)
Creatinine, Ser: 0.69 mg/dL (ref 0.40–1.20)
GFR: 85.23 mL/min (ref >60.00)
Glucose, Bld: 114 mg/dL — ABNORMAL HIGH (ref 70–99)
Potassium: 4.3 meq/L (ref 3.5–5.1)
Sodium: 140 meq/L (ref 135–145)
Total Bilirubin: 1.1 mg/dL (ref 0.2–1.2)
Total Protein: 7.2 g/dL (ref 6.0–8.3)

## 2024-07-25 LAB — VITAMIN B12: Vitamin B-12: >1500 pg/mL — ABNORMAL HIGH (ref 211–911)

## 2024-07-25 LAB — MICROALBUMIN / CREATININE URINE RATIO
Creatinine,U: 150.4 mg/dL
Microalb Creat Ratio: 17.2 mg/g (ref 0.0–30.0)
Microalb, Ur: 2.6 mg/dL — ABNORMAL HIGH (ref 0.0–1.9)

## 2024-07-25 LAB — FERRITIN: Ferritin: 6.3 ng/mL — ABNORMAL LOW (ref 10.0–291.0)

## 2024-07-25 LAB — TSH: TSH: 4.15 u[IU]/mL (ref 0.35–5.50)

## 2024-07-25 LAB — HEMOGLOBIN A1C: Hgb A1c MFr Bld: 6.8 % — ABNORMAL HIGH (ref 4.6–6.5)

## 2024-07-25 LAB — IRON: Iron: 50 ug/dL (ref 42–145)

## 2024-07-25 LAB — T4, FREE: Free T4: 0.75 ng/dL (ref 0.60–1.60)

## 2024-07-27 ENCOUNTER — Ambulatory Visit: Payer: Self-pay | Admitting: Family Medicine

## 2024-08-01 ENCOUNTER — Encounter: Payer: Self-pay | Admitting: Family Medicine

## 2024-08-01 ENCOUNTER — Ambulatory Visit: Admitting: Family Medicine

## 2024-08-01 VITALS — BP 130/78 | HR 79 | Temp 98.2°F | Ht 63.25 in | Wt 174.1 lb

## 2024-08-01 DIAGNOSIS — E119 Type 2 diabetes mellitus without complications: Secondary | ICD-10-CM | POA: Diagnosis not present

## 2024-08-01 DIAGNOSIS — Z0000 Encounter for general adult medical examination without abnormal findings: Secondary | ICD-10-CM

## 2024-08-01 DIAGNOSIS — E7800 Pure hypercholesterolemia, unspecified: Secondary | ICD-10-CM | POA: Diagnosis not present

## 2024-08-01 DIAGNOSIS — Z1211 Encounter for screening for malignant neoplasm of colon: Secondary | ICD-10-CM

## 2024-08-01 DIAGNOSIS — M8580 Other specified disorders of bone density and structure, unspecified site: Secondary | ICD-10-CM

## 2024-08-01 DIAGNOSIS — E038 Other specified hypothyroidism: Secondary | ICD-10-CM

## 2024-08-01 DIAGNOSIS — Z7984 Long term (current) use of oral hypoglycemic drugs: Secondary | ICD-10-CM

## 2024-08-01 DIAGNOSIS — K219 Gastro-esophageal reflux disease without esophagitis: Secondary | ICD-10-CM

## 2024-08-01 DIAGNOSIS — K760 Fatty (change of) liver, not elsewhere classified: Secondary | ICD-10-CM

## 2024-08-01 DIAGNOSIS — Z79899 Other long term (current) drug therapy: Secondary | ICD-10-CM

## 2024-08-01 DIAGNOSIS — D649 Anemia, unspecified: Secondary | ICD-10-CM

## 2024-08-01 MED ORDER — METFORMIN HCL ER 500 MG PO TB24: 500.0000 mg | ORAL_TABLET | Freq: Every day | ORAL | 3 refills | Status: AC

## 2024-08-01 MED ORDER — PANTOPRAZOLE SODIUM 40 MG PO TBEC: 40.0000 mg | DELAYED_RELEASE_TABLET | Freq: Two times a day (BID) | ORAL | 2 refills | Status: AC

## 2024-08-01 NOTE — Assessment & Plan Note (Signed)
 Dexa 10/2023 One fall No fractures Discussed fall prevention, supplements and exercise for bone density   Will increase D for low level

## 2024-08-01 NOTE — Assessment & Plan Note (Signed)
 Cologuard neg 10/2023-reassuring  Plans to follow up with GI  Mild anemia noted

## 2024-08-01 NOTE — Assessment & Plan Note (Signed)
 Lab Results  Component Value Date   VITAMINB12 >1500 (H) 07/25/2024   Encouraged to hold B12  Last vitamin D  Lab Results  Component Value Date   VD25OH 24.79 (L) 07/25/2023   Will double D3 supplement  Continues bid ppi Gi follow up recommended

## 2024-08-01 NOTE — Assessment & Plan Note (Signed)
 Fairly stable Lab Results  Component Value Date   HGBA1C 6.8 (H) 07/25/2024   HGBA1C 6.5 (H) 05/09/2024   HGBA1C 6.3 (A) 02/07/2024   Utd microalb Continues metformin  xr 500 mg daily   Encouraged Mediterranean low glycemic diet  More exercise /strength building Normal foot exam

## 2024-08-01 NOTE — Assessment & Plan Note (Signed)
 Continues protonix  40 mg bid Encouraged to avoid triggers  Recommend GI follow up for upper abd pain

## 2024-08-01 NOTE — Progress Notes (Signed)
 Subjective:    Patient ID: [name], female    DOB: 02/18/1951, 73 y.o.   MRN: [id number]  HPI  Here for health maintenance exam and to review chronic medical problems   Wt Readings from Last 3 Encounters:  08/01/24 174 lb 2 oz (79 kg)  05/09/24 171 lb (77.6 kg)  04/26/24 169 lb 5 oz (76.8 kg)   30.60 kg/m  Vitals:   08/01/24 0952  BP: 130/78  Pulse: 79  Temp: 98.2 F (36.8 C)  SpO2: 97%    Immunization History  Administered Date(s) Administered   Fluad Quad(high Dose 65+) 06/18/2019, 06/12/2020   Fluad Trivalent(High Dose 65+) 06/13/2023   INFLUENZA, HIGH DOSE SEASONAL PF 06/03/2021, 05/08/2024   Influenza Whole 05/30/2008   Influenza,inj,Quad PF,6+ Mos 05/03/2014, 06/06/2015, 05/10/2016, 05/26/2017, 06/09/2018   Influenza-Unspecified 05/24/2022   Moderna SARS-COV2 Booster Vaccination 12/03/2020   PFIZER(Purple Top)SARS-COV-2 Vaccination 10/05/2019, 10/30/2019, 06/05/2020   Pfizer Covid-19 Vaccine Bivalent Booster 37yrs & up 06/03/2021   Pfizer(Comirnaty)Fall Seasonal Vaccine 12 years and older 06/04/2022, 06/13/2023, 05/15/2024   Pneumococcal Conjugate-13 01/29/2015   Pneumococcal Polysaccharide-23 05/10/2016   Td 08/30/2002, 04/27/2019   Tdap 05/03/2014, 05/15/2024   Zoster Recombinant(Shingrix) 07/29/2021   Zoster, Live 09/07/2012    Health Maintenance Due  Topic Date Due   Colonoscopy  08/11/2023   Medicare Annual Wellness (AWV)  09/26/2024   Doing ok overall    Mammogram 11/2023  Has 10% breast tissue , severe macrocystic breast dz and had proph mastectomy Sister had breast cancer  Self breast exam- has thickening /scar tissue under right arm-this is baseline for her    Gyn health no c/o   Colon cancer screening  Colonoscopy 07/2013  Dr Kristie Cologuard neg 10/2023  (it just showed up on the mail)   Having some stomach problems and wonders if she needs appointment with GI  Left side of abd    Bone health  Dexa 10/2023  osteopenia  Falls  -one , tripped on furniture , bruised  Fractures-none Needs to slow down and be intentional with movements  Supplements ca plus D and 2000 international units  Last vitamin D  Lab Results  Component Value Date   VD25OH 24.79 (L) 07/25/2023    Exercise  Swimming over the summer  Is interested in water aerobics  10-15,000 steps per day  Some weights Elbows bother her   Utd derm care Uses sun protection   Mood    08/01/2024    9:55 AM 02/07/2024   11:32 AM 10/31/2023   11:40 AM 09/27/2023   10:28 AM 08/01/2023    2:27 PM  Depression screen PHQ 2/9  Decreased Interest 0 0 0 0 1  Down, Depressed, Hopeless 0 0 0 0 1  PHQ - 2 Score 0 0 0 0 2  Altered sleeping 0 0 3  3  Tired, decreased energy 0 0 0  1  Change in appetite 0 0 0  1  Feeling bad or failure about yourself  0 0 1  1  Trouble concentrating 0 0 0  0  Moving slowly or fidgety/restless 0 0 0  0  Suicidal thoughts 0 0 0  0  PHQ-9 Score 0 0  4   8   Difficult doing work/chores Not difficult at all Not difficult at all Not difficult at all  Not difficult at all     Data saved with a previous flowsheet row definition    Blood pressure BP Readings from Last 3 Encounters:  08/01/24 130/78  05/09/24 (!) 160/72  04/26/24 131/82   Lab Results  Component Value Date   NA 140 07/25/2024   K 4.3 07/25/2024   CO2 29 07/25/2024   GLUCOSE 114 (H) 07/25/2024   BUN 15 07/25/2024   CREATININE 0.69 07/25/2024   CALCIUM  9.7 07/25/2024   GFR 85.23 07/25/2024   EGFR 93 04/14/2023   GFRNONAA >60 09/21/2021    Fatty liver Lab Results  Component Value Date   ALT 34 07/25/2024   AST 26 07/25/2024   ALKPHOS 62 07/25/2024   BILITOT 1.1 07/25/2024    GERD Protonix   40 mg bid Still has some left abd symptoms  Avoids fatty and spicy foods   Lab Results  Component Value Date   VITAMINB12 >1500 (H) 07/25/2024   DM2 Lab Results  Component Value Date   HGBA1C 6.8 (H) 07/25/2024   HGBA1C 6.5 (H) 05/09/2024   HGBA1C 6.3  (A) 02/07/2024   Lab Results  Component Value Date   MICROALBUR 2.6 (H) 07/25/2024   Checks blood glucose in am -one teens to 120   Metformin  xr 500 mg daily   Eats close to the Mediterranean diet  Uses healthy cook books  Low fat protein   Subclinical hypothyroid Lab Results  Component Value Date   TSH 4.15 07/25/2024   Normal FT4  Hyperlipidemia Lab Results  Component Value Date   CHOL 125 07/25/2024   CHOL 129 05/09/2024   CHOL 158 07/25/2023   Lab Results  Component Value Date   HDL 69.40 07/25/2024   HDL 68 05/09/2024   HDL 64.90 07/25/2023   Lab Results  Component Value Date   LDLCALC 34 07/25/2024   LDLCALC 29 05/09/2024   LDLCALC 57 07/25/2023   Lab Results  Component Value Date   TRIG 110.0 07/25/2024   TRIG 206 (H) 05/09/2024   TRIG 182.0 (H) 07/25/2023   Lab Results  Component Value Date   CHOLHDL 2 07/25/2024   CHOLHDL 1.9 05/09/2024   CHOLHDL 2 07/25/2023   Lab Results  Component Value Date   LDLDIRECT 97.0 06/06/2018   LDLDIRECT 119.0 05/24/2013   LDLDIRECT 117.3 02/24/2012   Allergic to statins 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  Taking repatha   Fish oil   Is prone to muscle cramps   Lab Results  Component Value Date   WBC 5.7 07/25/2024   HGB 11.3 (L) 07/25/2024   HCT 35.3 (L) 07/25/2024   MCV 73.3 (L) 07/25/2024   PLT 328.0 07/25/2024   Mild anemia in past from blood donation  Last donation in June   Lab Results  Component Value Date   IRON 50 07/25/2024   FERRITIN 6.3 (L) 07/25/2024      Patient Active Problem List   Diagnosis Date Noted   Shingles 02/07/2024   Osteopenia 11/18/2023   Mild anemia 09/27/2023   Minimal CAD 04/05/2022   Fatty liver 09/11/2021   Right upper quadrant abdominal pain 09/11/2021   Insomnia 09/11/2021   Hyperlipidemia 08/06/2021   Vaccine reaction 08/06/2021   GAD (generalized anxiety disorder) 08/01/2021   Current use of proton pump inhibitor 07/20/2021   Colon cancer screening 07/20/2021    Hot flashes 07/11/2020   Subclinical hypothyroidism 07/11/2020   Allergy to statin medication 07/11/2020   Paresthesia 04/06/2019   Foot pain, bilateral 04/06/2019   Vaginal atrophy 06/09/2018   Breast cancer screening 05/31/2017   Controlled type 2 diabetes mellitus without complication, without long-term current use of insulin (HCC) 06/02/2016   Estrogen deficiency  01/29/2015   Routine general medical examination at a health care facility 02/20/2012   GERD 06/20/2008   Past Medical History:  Diagnosis Date   Allergy    Anemia    Anxiety    Arthritis    BBB (bundle branch block)    Blood transfusion without reported diagnosis    As a baby   Cataract    Complication of anesthesia    Depression    Father's passing   Diabetes mellitus without complication (HCC)    Dyspnea    Fatty liver    Fibrocystic disease of both breasts    GERD (gastroesophageal reflux disease)    Heart murmur 07-02-2021   Nurse practitioner found it on home visit   Hyperglycemia    PONV (postoperative nausea and vomiting)    Pre-diabetes    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   On continue HRT since 1980 per patient   Sphincter of Oddi dysfunction    Past Surgical History:  Procedure Laterality Date   ABDOMINAL HYSTERECTOMY     After ectopic pregnancy   APPENDECTOMY  1990   BREAST SURGERY  8030,8022, 1989   Multiple breat surgeries over the years   CARDIAC CATHETERIZATION     I think about 10 years ago. It was negative.   CARPAL TUNNEL RELEASE  08/2008   left   CHOLECYSTECTOMY  1999   COSMETIC SURGERY  1989 & 2012   Subq mastectomies and incapacitation of implants   ERCP     HEEL SPUR RESECTION Left 09/29/2021   Procedure: LEFT HEEL SPUR EXCISION AND ACHILLES TENDON REPAIR;  Surgeon: Sheril Coy, MD;  Location: [facility];  Service: Orthopedics;  Laterality: Left;   LEFT HEART CATHETERIZATION WITH CORONARY ANGIOGRAM N/A 06/08/2013   Procedure: LEFT HEART CATHETERIZATION WITH  CORONARY ANGIOGRAM;  Surgeon: Mickey Aleene JINNY Alveta, MD;  Location: [facility] C[facility];  Service: Cardiovascular;  Laterality: N/A;   MASTECTOMY     bilateral for severe macrocystic breast   SPINE SURGERY N/A 02/12/2020   neck   TUBAL LIGATION     Social History   Tobacco Use   Smoking status: Never    Passive exposure: Never   Smokeless tobacco: Never  Vaping Use   Vaping status: Never Used  Substance Use Topics   Alcohol use: Yes    Alcohol/week: 1.0 standard drink of alcohol    Types: 1 Shots of liquor per week    Comment: More like once a month if that   Drug use: No   Family History  Problem Relation Age of Onset   Brain cancer Mother        hospice   Diabetes Mother    Hypertension Mother    Varicose Veins Mother    Diabetes Father    Hypertension Father    Cancer Father        prostate and leukemia   Hearing loss Father    Breast cancer Sister 9   Cancer Sister    Cancer Maternal Aunt        Pancreatic   Diabetes Maternal Uncle    Heart disease Maternal Uncle    Heart disease Maternal Grandmother    Hyperlipidemia Maternal Grandmother    Stroke Maternal Grandmother    Obesity Maternal Grandfather    Cancer Sister    Allergies  Allergen Reactions   Atorvastatin Other (See Comments)    REACTION: rash   Ezetimibe Other (See Comments)    REACTION: muscle aches  Icosapent  Ethyl (Epa Ethyl Ester) (Fish) Other (See Comments)    Arm/leg weakness, SOB   Metformin  And Related Other (See Comments)    Muscle cramps- can tolerate    Polyoxyethylene 40 Sorbitol Septaoleate [Sorbitan] Other (See Comments)   Prednisone  Other (See Comments)    **INJECTIONS ONLY** caused tachycardia    Rosuvastatin  Other (See Comments)    Fatigue and weakness   Shingrix [Zoster Vac Recomb Adjuvanted]     Reaction ?  Weakness on one side    Sulfamethoxazole-Trimethoprim Other (See Comments)    Swelling of nostrils   Current Outpatient Medications on File Prior to Visit  Medication Sig  Dispense Refill   ALPRAZolam  (XANAX ) 1 MG tablet TAKE 1/2 TABLET BY MOUTH AT BEDTIME AS NEEDED FOR ANXIETY OR SLEEP 15 tablet 3   ASPERCREME LIDOCAINE  EX Apply 1 application  topically at bedtime as needed (foot pain).     aspirin  EC 81 MG tablet Take 81 mg by mouth daily. Swallow whole.     Blood Glucose Monitoring Suppl (ACCU-CHEK GUIDE ME) w/Device KIT Use to check blood sugar once daily  (dx. E11.9) 1 kit 0   Calcium -Magnesium -Vitamin D  (CALCIUM  1200+D3 PO) Take 1 tablet by mouth at bedtime.     Cholecalciferol (D3-1000 PO) Take 1 capsule by mouth daily.     cyclobenzaprine  (FLEXERIL ) 10 MG tablet TAKE 0.5 TABLETS BY MOUTH AT BEDTIME AS NEEDED FOR MUSCLE SPASMS (BACK PAIN). CAUTION OF SEDATION 30 tablet 1   diclofenac  Sodium (VOLTAREN ) 1 % GEL APPLY 4 GRAMS TO AFFECTED AREA 4 TIMES DAILY AS NEEDED 200 g 11   Evolocumab  (REPATHA  SURECLICK) 140 MG/ML SOAJ Inject 140 mg into the skin every 14 (fourteen) days. 2 mL 12   Glucose Blood (BLOOD GLUCOSE TEST STRIPS) STRP 1 each by In Vitro route daily. May substitute to any manufacturer covered by patient's insurance. 100 strip 3   Lancet Device MISC 1 each by Does not apply route daily. May substitute to any manufacturer covered by patient's insurance. 1 each 0   Lancets Misc. MISC 1 each by Does not apply route daily. May substitute to any manufacturer covered by patient's insurance. 100 each 3   Melatonin 10 MG CAPS Take 10 mg by mouth at bedtime.     metoprolol  succinate (TOPROL -XL) 25 MG 24 hr tablet Take 0.5 tablets (12.5 mg total) by mouth daily. 45 tablet 3   Multiple Vitamins-Minerals (EYE VITAMINS PO) Take 1 capsule by mouth daily.     Omega-3 Fatty Acids (FISH OIL PO) Take 1 capsule by mouth daily.     triamcinolone  cream (KENALOG ) 0.1 % APPLY 1 APPLICATION TOPICALLY 2 (TWO) TIMES DAILY AS NEEDED. RASH 30 g 3   No current facility-administered medications on file prior to visit.    Review of Systems  Constitutional:  Positive for  appetite change. Negative for activity change, fatigue, fever and unexpected weight change.  HENT:  Negative for congestion, ear pain, rhinorrhea, sinus pressure and sore throat.   Eyes:  Negative for pain, redness and visual disturbance.  Respiratory:  Negative for cough, shortness of breath and wheezing.   Cardiovascular:  Negative for chest pain and palpitations.  Gastrointestinal:  Negative for abdominal pain, blood in stool, constipation and diarrhea.  Endocrine: Negative for polydipsia and polyuria.  Genitourinary:  Negative for dysuria, frequency and urgency.  Musculoskeletal:  Negative for arthralgias, back pain and myalgias.  Skin:  Negative for pallor and rash.  Allergic/Immunologic: Negative for environmental allergies.  Neurological:  Negative  for dizziness, syncope and headaches.  Hematological:  Negative for adenopathy. Does not bruise/bleed easily.  Psychiatric/Behavioral:  Negative for decreased concentration and dysphoric mood. The patient is not nervous/anxious.        Objective:   Physical Exam Constitutional:      General: She is not in acute distress.    Appearance: Normal appearance. She is well-developed. She is obese. She is not ill-appearing or diaphoretic.  HENT:     Head: Normocephalic and atraumatic.     Right Ear: Tympanic membrane, ear canal and external ear normal.     Left Ear: Tympanic membrane, ear canal and external ear normal.     Nose: Nose normal. No congestion.     Mouth/Throat:     Mouth: Mucous membranes are moist.     Pharynx: Oropharynx is clear. No posterior oropharyngeal erythema.  Eyes:     General: No scleral icterus.    Extraocular Movements: Extraocular movements intact.     Conjunctiva/sclera: Conjunctivae normal.     Pupils: Pupils are equal, round, and reactive to light.  Neck:     Thyroid : No thyromegaly.     Vascular: No carotid bruit or JVD.  Cardiovascular:     Rate and Rhythm: Normal rate and regular rhythm.     Pulses:  Normal pulses.     Heart sounds: Murmur heard.     No gallop.  Pulmonary:     Effort: Pulmonary effort is normal. No respiratory distress.     Breath sounds: Normal breath sounds. No wheezing.     Comments: Good air exch Chest:     Chest wall: No tenderness.  Abdominal:     General: Bowel sounds are normal. There is no distension or abdominal bruit.     Palpations: Abdomen is soft. There is no mass.     Tenderness: There is no abdominal tenderness.     Hernia: No hernia is present.  Genitourinary:    Comments: Bilateral breast implants (mastectomy) baseline surg changes  Some fatty tissue in axillae bilaterally No masses   Musculoskeletal:        General: No tenderness. Normal range of motion.     Cervical back: Normal range of motion and neck supple. No rigidity. No muscular tenderness.     Right lower leg: No edema.     Left lower leg: No edema.     Comments: No kyphosis   Lymphadenopathy:     Cervical: No cervical adenopathy.  Skin:    General: Skin is warm and dry.     Coloration: Skin is not pale.     Findings: No erythema or rash.     Comments: Solar lentigines diffusely Scattered sks   Neurological:     Mental Status: She is alert. Mental status is at baseline.     Cranial Nerves: No cranial nerve deficit.     Motor: No abnormal muscle tone.     Coordination: Coordination normal.     Gait: Gait normal.     Deep Tendon Reflexes: Reflexes are normal and symmetric. Reflexes normal.  Psychiatric:        Mood and Affect: Mood normal.        Cognition and Memory: Cognition and memory normal.           Assessment & Plan:   Problem List Items Addressed This Visit       Digestive   GERD   Continues protonix  40 mg bid Encouraged to avoid triggers  Recommend GI follow  up for upper abd pain       Relevant Medications   pantoprazole  (PROTONIX ) 40 MG tablet   Fatty liver   Reassuring labs Lab Results  Component Value Date   ALT 34 07/25/2024   AST 26  07/25/2024   ALKPHOS 62 07/25/2024   BILITOT 1.1 07/25/2024    Strongly encouraged weight loss with healthy diet and exercise         Endocrine   Subclinical hypothyroidism   Normal TSH and FT4 today No clinical changes       Controlled type 2 diabetes mellitus without complication, without long-term current use of insulin (HCC)   Fairly stable Lab Results  Component Value Date   HGBA1C 6.8 (H) 07/25/2024   HGBA1C 6.5 (H) 05/09/2024   HGBA1C 6.3 (A) 02/07/2024   Utd microalb Continues metformin  xr 500 mg daily   Encouraged Mediterranean low glycemic diet  More exercise /strength building Normal foot exam        Relevant Medications   metFORMIN  (GLUCOPHAGE -XR) 500 MG 24 hr tablet     Musculoskeletal and Integument   Osteopenia   Dexa 10/2023 One fall No fractures Discussed fall prevention, supplements and exercise for bone density   Will increase D for low level        Other   Routine general medical examination at a health care facility - Primary   Reviewed health habits including diet and exercise and skin cancer prevention Reviewed appropriate screening tests for age  Also reviewed health mt list, fam hx and immunization status , as well as social and family history   See HPI Labs reviewed and ordered Health Maintenance  Topic Date Due   Colon Cancer Screening  08/11/2023   Medicare Annual Wellness Visit  09/26/2024   Complete foot exam   10/30/2024   COVID-19 Vaccine (8 - Pfizer risk 2025-26 season) 11/12/2024   Eye exam for diabetics  12/12/2024   Breast Cancer Screening  12/26/2024   Hemoglobin A1C  01/22/2025   Yearly kidney function blood test for diabetes  07/25/2025   Yearly kidney health urinalysis for diabetes  07/25/2025   DTaP/Tdap/Td vaccine (5 - Td or Tdap) 05/15/2034   Pneumococcal Vaccine for age over 23  Completed   Flu Shot  Completed   Osteoporosis screening with Bone Density Scan  Completed   Hepatitis C Screening  Completed    Meningitis B Vaccine  Aged Out   Zoster (Shingles) Vaccine  Discontinued    Continues mammograms for small amt of breast tissue Cologuard neg 10/2023 Discussed fall prevention, supplements and exercise for bone density  PHQ 0       Mild anemia   Lab Results  Component Value Date   WBC 5.7 07/25/2024   HGB 11.3 (L) 07/25/2024   HCT 35.3 (L) 07/25/2024   MCV 73.3 (L) 07/25/2024   PLT 328.0 07/25/2024  Last blood donation was June Going less often Iron 50  ? Causing cramps   Plans to follow up with gi last      Hyperlipidemia   Disc goals for lipids and reasons to control them Rev last labs with pt Rev low sat fat diet in detail LDL and HDL good  Trig down  On repatha  and fish oil  Is allergic to statins       Current use of proton pump inhibitor   Lab Results  Component Value Date   VITAMINB12 >1500 (H) 07/25/2024   Encouraged to hold B12  Last vitamin  D Lab Results  Component Value Date   VD25OH 24.79 (L) 07/25/2023   Will double D3 supplement  Continues bid ppi Gi follow up recommended      Colon cancer screening   Cologuard neg 10/2023-reassuring  Plans to follow up with GI  Mild anemia noted

## 2024-08-01 NOTE — Assessment & Plan Note (Signed)
 Reviewed health habits including diet and exercise and skin cancer prevention Reviewed appropriate screening tests for age  Also reviewed health mt list, fam hx and immunization status , as well as social and family history   See HPI Labs reviewed and ordered Health Maintenance  Topic Date Due   Colon Cancer Screening  08/11/2023   Medicare Annual Wellness Visit  09/26/2024   Complete foot exam   10/30/2024   COVID-19 Vaccine (8 - Pfizer risk 2025-26 season) 11/12/2024   Eye exam for diabetics  12/12/2024   Breast Cancer Screening  12/26/2024   Hemoglobin A1C  01/22/2025   Yearly kidney function blood test for diabetes  07/25/2025   Yearly kidney health urinalysis for diabetes  07/25/2025   DTaP/Tdap/Td vaccine (5 - Td or Tdap) 05/15/2034   Pneumococcal Vaccine for age over 43  Completed   Flu Shot  Completed   Osteoporosis screening with Bone Density Scan  Completed   Hepatitis C Screening  Completed   Meningitis B Vaccine  Aged Out   Zoster (Shingles) Vaccine  Discontinued    Continues mammograms for small amt of breast tissue Cologuard neg 10/2023 Discussed fall prevention, supplements and exercise for bone density  PHQ 0

## 2024-08-01 NOTE — Assessment & Plan Note (Signed)
 Normal TSH and FT4 today No clinical changes

## 2024-08-01 NOTE — Assessment & Plan Note (Signed)
 Lab Results  Component Value Date   WBC 5.7 07/25/2024   HGB 11.3 (L) 07/25/2024   HCT 35.3 (L) 07/25/2024   MCV 73.3 (L) 07/25/2024   PLT 328.0 07/25/2024  Last blood donation was June Going less often Iron 50  ? Causing cramps   Plans to follow up with gi last

## 2024-08-01 NOTE — Patient Instructions (Addendum)
 Stop the B12    Call to schedule appointment with GI/ Dr Kristie - and if you need a referral let us  know   Add 1000-2000 international units of vitamin D3 daily   Stay active  Add some strength training to your routine, this is important for bone and brain health and can reduce your risk of falls and help your body use insulin properly and regulate weight  Light weights, exercise bands , and internet videos are a good way to start  Yoga (chair or regular), machines , floor exercises or a gym with machines are also good options  Also water aerobics   Check glucose daily  Am -every other 2 hours post meal - every other   Mild anemia  Get back to GI  Hold off on blood donation for now   Follow up in 3 months

## 2024-08-01 NOTE — Assessment & Plan Note (Signed)
 Reassuring labs Lab Results  Component Value Date   ALT 34 07/25/2024   AST 26 07/25/2024   ALKPHOS 62 07/25/2024   BILITOT 1.1 07/25/2024    Strongly encouraged weight loss with healthy diet and exercise

## 2024-08-01 NOTE — Assessment & Plan Note (Signed)
 Disc goals for lipids and reasons to control them Rev last labs with pt Rev low sat fat diet in detail LDL and HDL good  Trig down  On repatha  and fish oil  Is allergic to statins

## 2024-08-07 NOTE — Progress Notes (Signed)
 [name]                                          MRN: [id number]   08/07/2024   The [facility] Quality Team Specialist reviewed this patient medical record for the purposes of chart review for care gap closure. The following were reviewed: abstraction for care gap closure-glycemic status assessment and kidney health evaluation for diabetes:eGFR  and uACR.    [facility] Quality Team

## 2024-08-08 ENCOUNTER — Telehealth (HOSPITAL_BASED_OUTPATIENT_CLINIC_OR_DEPARTMENT_OTHER): Payer: Self-pay

## 2024-08-08 ENCOUNTER — Telehealth: Payer: Self-pay

## 2024-08-08 NOTE — Telephone Encounter (Signed)
 Pt returning call. Please advise.

## 2024-08-08 NOTE — Telephone Encounter (Signed)
 Called patient to schedule a televisit appointment for a pre-op clearance on 08/09/24 @ 9:20. Meds, Rec, and consent done.

## 2024-08-08 NOTE — Telephone Encounter (Signed)
 Called patient to schedule a televisit appointment for a pre-op clearance on 08/09/24 @ 9:20. Meds, Rec, and consent done.          Patient Consent for Virtual Visit        [name] has provided verbal consent on 08/08/2024 for a virtual visit (video or telephone).   CONSENT FOR VIRTUAL VISIT FOR:  [name]  By participating in this virtual visit I agree to the following:  I hereby voluntarily request, consent and authorize [facility] and its employed or contracted physicians, physician assistants, nurse practitioners or other licensed health care professionals (the Practitioner), to provide me with telemedicine health care services (the Services) as deemed necessary by the treating Practitioner. I acknowledge and consent to receive the Services by the Practitioner via telemedicine. I understand that the telemedicine visit will involve communicating with the Practitioner through live audiovisual communication technology and the disclosure of certain medical information by electronic transmission. I acknowledge that I have been given the opportunity to request an in-person assessment or other available alternative prior to the telemedicine visit and am voluntarily participating in the telemedicine visit.  I understand that I have the right to withhold or withdraw my consent to the use of telemedicine in the course of my care at any time, without affecting my right to future care or treatment, and that the Practitioner or I may terminate the telemedicine visit at any time. I understand that I have the right to inspect all information obtained and/or recorded in the course of the telemedicine visit and may receive copies of available information for a reasonable fee.  I understand that some of the potential risks of receiving the Services via telemedicine include:  Delay or interruption in medical evaluation due to technological equipment failure or  disruption; Information transmitted may not be sufficient (e.g. poor resolution of images) to allow for appropriate medical decision making by the Practitioner; and/or  In rare instances, security protocols could fail, causing a breach of personal health information.  Furthermore, I acknowledge that it is my responsibility to provide information about my medical history, conditions and care that is complete and accurate to the best of my ability. I acknowledge that Practitioner's advice, recommendations, and/or decision may be based on factors not within their control, such as incomplete or inaccurate data provided by me or distortions of diagnostic images or specimens that may result from electronic transmissions. I understand that the practice of medicine is not an exact science and that Practitioner makes no warranties or guarantees regarding treatment outcomes. I acknowledge that a copy of this consent can be made available to me via my patient portal [facility] MyChart), or I can request a printed copy by calling the office of [facility].    I understand that my insurance will be billed for this visit.   I have read or had this consent read to me. I understand the contents of this consent, which adequately explains the benefits and risks of the Services being provided via telemedicine.  I have been provided ample opportunity to ask questions regarding this consent and the Services and have had my questions answered to my satisfaction. I give my informed consent for the services to be provided through the use of telemedicine in my medical care

## 2024-08-08 NOTE — Telephone Encounter (Signed)
 Attempted to call patient to schedule a telelvisit for a pre-op clearance. No answer, left a vm to call back.

## 2024-08-08 NOTE — Telephone Encounter (Signed)
° °  Name: [name]  DOB: Mar 26, 1951  MRN: [id number]  Primary Cardiologist: Kardie Tobb, DO   Preoperative team, please contact this patient and set up a phone call appointment for further preoperative risk assessment. Please obtain consent and complete medication review. Thank you for your help.  I confirm that guidance regarding antiplatelet and oral anticoagulation therapy has been completed and, if necessary, noted below.  Regarding ASA therapy, we recommend continuation of ASA throughout the perioperative period.  However, if the surgeon feels that cessation of ASA is required in the perioperative period, it may be stopped 5-7 days prior to surgery with a plan to resume it as soon as felt to be feasible from a surgical standpoint in the post-operative period.   I also confirmed the patient resides in the state of [place] . As per [facility] [employer] telemedicine laws, the patient must reside in the state in which the provider is licensed.   Rollo FABIENE Louder, PA-C 08/08/2024, 9:12 AM [facility]

## 2024-08-08 NOTE — Telephone Encounter (Signed)
° °  Pre-operative Risk Assessment    Patient Name: [name]  DOB: 05/07/1951 MRN: [id number]   Date of last office visit: 05/09/24 with Dr. Sheena Date of next office visit: NA  Request for Surgical Clearance    Procedure:  EGD and Colonoscopy  Date of Surgery:  Clearance 08/17/24                                 Surgeon:  Dr. Kristie Socks Group or Practice Name:  [facility] Phone number:  [phone] Fax number:  [phone]   Type of Clearance Requested:   - Medical  - Pharmacy:  Hold Aspirin  not indicated    Type of Anesthesia:  propofol    Additional requests/questions:    SignedAugustin JONETTA [name]   08/08/2024, 8:38 AM

## 2024-08-09 ENCOUNTER — Ambulatory Visit: Attending: Cardiovascular Disease | Admitting: Emergency Medicine

## 2024-08-09 DIAGNOSIS — Z01810 Encounter for preprocedural cardiovascular examination: Secondary | ICD-10-CM

## 2024-08-09 NOTE — Progress Notes (Signed)
 Virtual Visit via Telephone Note   Because of [name] co-morbid illnesses, she is at least at moderate risk for complications without adequate follow up.  This format is felt to be most appropriate for this patient at this time.  Due to technical limitations with video connection (technology), today's appointment will be conducted as an audio only telehealth visit, and [name] verbally agreed to proceed in this manner.   All issues noted in this document were discussed and addressed.  No physical exam could be performed with this format.  Evaluation Performed:  Preoperative cardiovascular risk assessment _____________   Date:  08/09/2024   Patient ID:  [name], DOB [birth year], MRN [id number] Patient Location:  Home Provider location:   Office  Primary Care Provider:  Randeen Laine LABOR, MD Primary Cardiologist:  Kardie Tobb, DO  Chief Complaint / Patient Profile   73 y.o. y/o female with a h/o coronary artery disease, hyperlipidemia, previously suspected TIA, GERD, hypertension, T2DM who is pending EGD and colonoscopy on 08/17/2024 with [facility] and presents today for telephonic preoperative cardiovascular risk assessment.  History of Present Illness    [name] is a 73 y.o. female who presents via audio/video conferencing for a telehealth visit today.  Pt was last seen in cardiology clinic on 05/09/2024 by Dr. Sheena.  At that time [name] was doing well.  The patient is now pending procedure as outlined above. Since her last visit, she denies chest pain, shortness of breath, lower extremity edema, fatigue, palpitations, melena, hematuria, hemoptysis, diaphoresis, weakness, presyncope, syncope, orthopnea, and PND.  Today patient is doing well without acute cardiovascular concerns or complaints.  She stays relatively active where she will walk 10,000 steps a day, do housework, and swim during the summers without any exertional symptoms.  No  symptoms to suspect active angina.  She is able to complete greater than 4 METS.  Past Medical History    Past Medical History:  Diagnosis Date   Allergy    Anemia    Anxiety    Arthritis    BBB (bundle branch block)    Blood transfusion without reported diagnosis    As a baby   Cataract    Complication of anesthesia    Depression    Fathers passing   Diabetes mellitus without complication (HCC)    Dyspnea    Fatty liver    Fibrocystic disease of both breasts    GERD (gastroesophageal reflux disease)    Heart murmur 07-02-2021   Nurse practitioner found it on home visit   Hyperglycemia    PONV (postoperative nausea and vomiting)    Pre-diabetes    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   On continue HRT since 1980 per patient   Sphincter of Oddi dysfunction    Past Surgical History:  Procedure Laterality Date   ABDOMINAL HYSTERECTOMY     After ectopic pregnancy   APPENDECTOMY  1990   BREAST SURGERY  8030,8022, 1989   Multiple breat surgeries over the years   CARDIAC CATHETERIZATION     I think about 10 years ago. It was negative.   CARPAL TUNNEL RELEASE  08/2008   left   CHOLECYSTECTOMY  1999   COSMETIC SURGERY  1989 & 2012   Subq mastectomies and incapacitation of implants   ERCP     HEEL SPUR RESECTION Left 09/29/2021   Procedure: LEFT HEEL SPUR EXCISION AND ACHILLES TENDON REPAIR;  Surgeon: Sheril Coy, MD;  Location: [facility];  Service: Orthopedics;  Laterality: Left;   LEFT HEART CATHETERIZATION WITH CORONARY ANGIOGRAM N/A 06/08/2013   Procedure: LEFT HEART CATHETERIZATION WITH CORONARY ANGIOGRAM;  Surgeon: Mickey Aleene JINNY Alveta, MD;  Location[facility] CATH LAB;  Service: Cardiovascular;  Laterality: N/A;   MASTECTOMY     bilateral for severe macrocystic breast   SPINE SURGERY N/A 02/12/2020   neck   TUBAL LIGATION      Allergies  Allergies[1]  Home Medications    Prior to Admission medications  Medication Sig Start Date End Date Taking?  Authorizing Provider  ALPRAZolam  (XANAX ) 1 MG tablet TAKE 1/2 TABLET BY MOUTH AT BEDTIME AS NEEDED FOR ANXIETY OR SLEEP 07/17/24   Tower, Laine LABOR, MD  ASPERCREME LIDOCAINE  EX Apply 1 application  topically at bedtime as needed (foot pain).    [provider]  aspirin  EC 81 MG tablet Take 81 mg by mouth daily. Swallow whole.    [provider]  Blood Glucose Monitoring Suppl (ACCU-CHEK GUIDE ME) w/Device KIT Use to check blood sugar once daily  (dx. E11.9) 01/02/24   Tower, Laine LABOR, MD  Calcium -Magnesium -Vitamin D  (CALCIUM  1200+D3 PO) Take 1 tablet by mouth at bedtime.    [provider]  Cholecalciferol (D3-1000 PO) Take 1 capsule by mouth daily.    [provider]  cyclobenzaprine  (FLEXERIL ) 10 MG tablet TAKE 0.5 TABLETS BY MOUTH AT BEDTIME AS NEEDED FOR MUSCLE SPASMS (BACK PAIN). CAUTION OF SEDATION 04/10/24   Tower, Laine LABOR, MD  diclofenac  Sodium (VOLTAREN ) 1 % GEL APPLY 4 GRAMS TO AFFECTED AREA 4 TIMES DAILY AS NEEDED 05/27/22   Tower, Laine LABOR, MD  Evolocumab  (REPATHA  SURECLICK) 140 MG/ML SOAJ Inject 140 mg into the skin every 14 (fourteen) days. 04/11/24   Tobb, Kardie, DO  Glucose Blood (BLOOD GLUCOSE TEST STRIPS) STRP 1 each by In Vitro route daily. May substitute to any manufacturer covered by patient's insurance. 10/31/23   Tower, Laine LABOR, MD  Lancet Device MISC 1 each by Does not apply route daily. May substitute to any manufacturer covered by patient's insurance. 10/31/23   Tower, Laine LABOR, MD  Lancets Misc. MISC 1 each by Does not apply route daily. May substitute to any manufacturer covered by patient's insurance. 10/31/23   Tower, Laine LABOR, MD  Melatonin 10 MG CAPS Take 10 mg by mouth at bedtime.    [provider]  metFORMIN  (GLUCOPHAGE -XR) 500 MG 24 hr tablet Take 1 tablet (500 mg total) by mouth daily with breakfast. 08/01/24   Tower, Laine LABOR, MD  metoprolol  succinate (TOPROL -XL) 25 MG 24 hr tablet Take 0.5 tablets (12.5 mg total) by mouth daily. 05/18/24    Tobb, Kardie, DO  Multiple Vitamins-Minerals (EYE VITAMINS PO) Take 1 capsule by mouth daily.    [provider]  Omega-3 Fatty Acids (FISH OIL PO) Take 1 capsule by mouth daily.    [provider]  pantoprazole  (PROTONIX ) 40 MG tablet Take 1 tablet (40 mg total) by mouth 2 (two) times daily. 08/01/24   Tower, Laine LABOR, MD  triamcinolone  cream (KENALOG ) 0.1 % APPLY 1 APPLICATION TOPICALLY 2 (TWO) TIMES DAILY AS NEEDED. RASH 08/25/21   Tower, Laine LABOR, MD    Physical Exam    Vital Signs:  [name] does not have vital signs available for review today.  Given telephonic nature of communication, physical exam is limited. AAOx3. NAD. Normal affect.  Speech and respirations are unlabored.  Accessory Clinical Findings    None  Assessment & Plan    1.  Preoperative Cardiovascular Risk Assessment: According to the Revised Cardiac Risk Index (RCRI), her Perioperative Risk of Major Cardiac Event is (%): 0.9. Her Functional Capacity in METs is: 6.55 according to the Duke Activity Status Index (DASI).  Therefore, based on ACC/AHA guidelines, patient would be at acceptable risk for the planned procedure without further cardiovascular testing. I will route this recommendation to the requesting party via Epic fax function.  The patient was advised that if she develops new symptoms prior to surgery to contact our office to arrange for a follow-up visit, and she verbalized understanding.  She may hold Aspirin  for 5-7 days prior to procedure. Please resume Aspirin  as soon as possible postprocedure, at the discretion of the surgeon.  A copy of this note will be routed to requesting surgeon.  Time:   Today, I have spent 8 minutes with the patient with telehealth technology discussing medical history, symptoms, and management plan.     Rose LITTIE Louis, NP  08/09/2024, 9:48 AM     [1]  Allergies Allergen Reactions   Atorvastatin Other (See Comments)    REACTION: rash    Ezetimibe Other (See Comments)    REACTION: muscle aches   Icosapent  Ethyl (Epa Ethyl Ester) (Fish) Other (See Comments)    Arm/leg weakness, SOB   Metformin  And Related Other (See Comments)    Muscle cramps- can tolerate    Polyoxyethylene 40 Sorbitol Septaoleate [Sorbitan] Other (See Comments)   Prednisone  Other (See Comments)    **INJECTIONS ONLY** caused tachycardia    Rosuvastatin  Other (See Comments)    Fatigue and weakness   Shingrix [Zoster Vac Recomb Adjuvanted]     Reaction ?  Weakness on one side    Sulfamethoxazole-Trimethoprim Other (See Comments)    Swelling of nostrils

## 2024-08-11 ENCOUNTER — Other Ambulatory Visit: Payer: Self-pay | Admitting: Family Medicine

## 2024-08-13 NOTE — Telephone Encounter (Signed)
 Last filled on 04/10/24 #30 tab/ 1 refill  F/u scheduled on 10/30/24

## 2024-08-17 LAB — HM COLONOSCOPY

## 2024-08-18 ENCOUNTER — Encounter: Payer: Self-pay | Admitting: Family Medicine

## 2024-08-18 DIAGNOSIS — D649 Anemia, unspecified: Secondary | ICD-10-CM

## 2024-08-20 ENCOUNTER — Ambulatory Visit: Payer: Self-pay

## 2024-08-20 NOTE — Telephone Encounter (Signed)
 Aware, will watch for correspondence Thanks for seeing her   Keep on list for cancellations please  If symptoms worsen let us  know   Agree with ER/UC precautions

## 2024-08-20 NOTE — Telephone Encounter (Signed)
 Patient only has lab appointment scheduled tomorrow.  Then it looks like she got scheduled with Dr. KANDICE on 08/28/2024.

## 2024-08-20 NOTE — Telephone Encounter (Signed)
 FYI Only or Action Required?: FYI only for provider: appointment scheduled on 12/23.  Patient was last seen in primary care on 08/01/2024 by Randeen Laine LABOR, MD.  Called Nurse Triage reporting Headache and Shortness of Breath.  Symptoms began a week ago.  Interventions attempted: OTC medications: Advil.  Symptoms are: gradually worsening.  Triage Disposition: See PCP When Office is Open (Within 3 Days)  Patient/caregiver understands and will follow disposition?: Yes   1 week ago onset of 3/10 constant pressure like headaches and mild SOB/lethargy with exertion. Denies numbness, tingling, weakness, or changes to speech. Denies double or loss of vision. Hit head on a cabinet door after walking into it that created a hematoma about a week ago. Hematoma has resolved. Takes a baby aspirin , wasn't taking any at that time d/t needing to have an endoscopy done. Also reports her hgb  is 11.3 and ferritin 6.3. Concerned these may be contributing to her symptoms. Has appt scheduled tomorrow to get these redrawn. Scheduled appt with different provider at home office tomorrow d/t no PCP availability within timeframe. Advised UC or ED for worsening symptoms.    Copied from CRM [id number]. Topic: Clinical - Red Word Triage >> Aug 20, 2024  2:20 PM Mesmerise C wrote: [facility] that prompted transfer to Nurse Triage: Patient has been experiencing headaches and sob, states the headaches are like tightness around her head been ongoing for a week now Reason for Disposition  [1] After 3 days AND [2] headache persists  Answer Assessment - Initial Assessment Questions 1. MECHANISM: How did the injury happen? For falls, ask: What height did you fall from? and What surface did you fall against?      Accidentally walked into a cabinet door   2. ONSET: When did the injury happen? (e.g., minutes, hours ago)      A week ago  3. NEUROLOGIC SYMPTOMS: Was there any loss of consciousness? Are there any other  neurological symptoms?      Denies  4. MENTAL STATUS: Does the person know who they are, who you are, and where they are?      Yes  5. LOCATION: What part of the head was hit?      Right front  6. SCALP APPEARANCE: What does the scalp look like? Is it bleeding now? If Yes, ask: Is it difficult to stop?      Had a hematoma, since resolved  7. SIZE: For cuts, bruises, or swelling, ask: How large is it? (e.g., inches or centimeters)      Had a hematoma, since resolved  8. PAIN: Is there any pain? If Yes, ask: How bad is it? (Scale 0-10; or none, mild, moderate, severe)     3/10 headache pain  9. TETANUS: For any breaks in the skin, ask: When was your last tetanus booster?     Did not break skin  10. BLOOD THINNERS: Do you take any blood thinners? (e.g., aspirin , clopidogrel / Plavix, coumadin, heparin ). Notes: Other strong blood thinners include: Arixtra (fondaparinux), Eliquis (apixaban), Pradaxa (dabigatran), and Xarelto (rivaroxaban).       Takes baby aspirin , wasn't taking any at that time (for a week) d/t needing to have an endoscopy done.  11. OTHER SYMPTOMS: Do you have any other symptoms? (e.g., neck pain, vomiting)       Mild increased SOB with exertion. Mild nausea, intermittent x1 week.  Protocols used: Head Injury-A-AH

## 2024-08-20 NOTE — Assessment & Plan Note (Signed)
 Negative EGD/colonoscopy

## 2024-08-21 ENCOUNTER — Other Ambulatory Visit (INDEPENDENT_AMBULATORY_CARE_PROVIDER_SITE_OTHER)

## 2024-08-21 DIAGNOSIS — D649 Anemia, unspecified: Secondary | ICD-10-CM | POA: Diagnosis not present

## 2024-08-21 LAB — RETICULOCYTES
ABS Retic: 52800 {cells}/uL (ref 20000–80000)
Retic Ct Pct: 1.1 %

## 2024-08-21 NOTE — Telephone Encounter (Signed)
 Patient came in for lab work today and was told she had appointment with Dr. KANDICE at 4. I see where patient was given that date and time but appointment was not put in. We have nothing open in office. I have set her up to see Mallie tomorrow at 11:20. I have reviewed all red words and she will go to ED of any.

## 2024-08-22 ENCOUNTER — Ambulatory Visit (HOSPITAL_COMMUNITY)
Admission: RE | Admit: 2024-08-22 | Discharge: 2024-08-22 | Disposition: A | Discharge status: Home / Self Care | Source: Ambulatory Visit | Attending: Primary Care | Admitting: Primary Care

## 2024-08-22 ENCOUNTER — Encounter: Payer: Self-pay | Admitting: Primary Care

## 2024-08-22 ENCOUNTER — Ambulatory Visit: Admitting: Primary Care

## 2024-08-22 VITALS — BP 128/68 | HR 76 | Temp 98.0°F | Ht 63.25 in | Wt 175.2 lb

## 2024-08-22 DIAGNOSIS — G4452 New daily persistent headache (NDPH): Secondary | ICD-10-CM | POA: Insufficient documentation

## 2024-08-22 LAB — CBC WITH DIFFERENTIAL/PLATELET
Basophils Absolute: 0 K/uL (ref 0.0–0.1)
Basophils Relative: 0.1 % (ref 0.0–3.0)
Eosinophils Absolute: 0.5 K/uL (ref 0.0–0.7)
Eosinophils Relative: 7.5 % — ABNORMAL HIGH (ref 0.0–5.0)
HCT: 34 % — ABNORMAL LOW (ref 36.0–46.0)
Hemoglobin: 11 g/dL — ABNORMAL LOW (ref 12.0–15.0)
Lymphocytes Relative: 48.8 % — ABNORMAL HIGH (ref 12.0–46.0)
Lymphs Abs: 3.2 K/uL (ref 0.7–4.0)
MCHC: 32.4 g/dL (ref 30.0–36.0)
MCV: 71.5 fl — ABNORMAL LOW (ref 78.0–100.0)
Monocytes Absolute: 0.6 K/uL (ref 0.1–1.0)
Monocytes Relative: 9.5 % (ref 3.0–12.0)
Neutro Abs: 2.3 K/uL (ref 1.4–7.7)
Neutrophils Relative %: 34.1 % — ABNORMAL LOW (ref 43.0–77.0)
Platelets: 328 K/uL (ref 150.0–400.0)
RBC: 4.75 Mil/uL (ref 3.87–5.11)
RDW: 16.3 % — ABNORMAL HIGH (ref 11.5–15.5)
WBC: 6.6 K/uL (ref 4.0–10.5)

## 2024-08-22 LAB — FERRITIN: Ferritin: 8.3 ng/mL — ABNORMAL LOW (ref 10.0–291.0)

## 2024-08-22 LAB — IRON: Iron: 24 ug/dL — ABNORMAL LOW (ref 42–145)

## 2024-08-22 MED ORDER — KETOROLAC TROMETHAMINE 60 MG/2ML IM SOLN
60.0000 mg | Freq: Once | INTRAMUSCULAR | Status: AC
  Administered 2024-08-22: 60 mg via INTRAMUSCULAR

## 2024-08-22 NOTE — Progress Notes (Signed)
 "  Subjective:    Patient ID: [name], female    DOB: May 01, 1951, 73 y.o.   MRN: [id number]  [name] is a very pleasant 73 y.o. female patient of Dr. Randeen with a history of type 2 diabetes, hypothyroidism, hyperlipidemia who presents today to discuss headaches.  Symptom onset 1 week ago with a bandlike pressure headache to the entire head. She hit her head to the right temporal region of her head one week ago on a kitchen cabinet while rising up from loading the dishwasher. The site of injury doesn't hurt much unless she applies pressure. She's not sure if the head pressure began first or the trauma of hitting her head.   She has noticed that her eyes feel more tired, left eye has become fuzzy. Also with intermittent nausea without vomiting, right sided sinus pressure. She denies dizziness, photophobia, changes in speech, history of migraines.   She's taken nothing OTC for her symptoms. She is not on anticoagulants, does take baby aspirin .   She underwent labs yesterday per PCP, which showed ongoing iron deficiency anemia. She does not see hematology.    Review of Systems  HENT:  Positive for sinus pressure.   Eyes:  Positive for visual disturbance.  Respiratory:  Negative for shortness of breath.   Cardiovascular:  Negative for chest pain.  Neurological:  Positive for headaches. Negative for dizziness.         Past Medical History:  Diagnosis Date   Allergy    Anemia    Anxiety    Arthritis    BBB (bundle branch block)    Blood transfusion without reported diagnosis    As a baby   Cataract    Complication of anesthesia    Depression    Fathers passing   Diabetes mellitus without complication (HCC)    Dyspnea    Fatty liver    Fibrocystic disease of both breasts    GERD (gastroesophageal reflux disease)    Heart murmur 07-02-2021   Nurse practitioner found it on home visit   Hyperglycemia    PONV (postoperative nausea and vomiting)    Pre-diabetes     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   On continue HRT since 1980 per patient   Sphincter of Oddi dysfunction     Social History   Socioeconomic History   Marital status: Married    Spouse name: Not on file   Number of children: Not on file   Years of education: Not on file   Highest education level: Associate degree: occupational, scientist, product/process development, or vocational program  Occupational History   Not on file  Tobacco Use   Smoking status: Never    Passive exposure: Never   Smokeless tobacco: Never  Vaping Use   Vaping status: Never Used  Substance and Sexual Activity   Alcohol use: Yes    Alcohol/week: 1.0 standard drink of alcohol    Types: 1 Shots of liquor per week    Comment: More like once a month if that   Drug use: No   Sexual activity: Yes    Birth control/protection: Surgical    Comment: Hysterectomy  Other Topics Concern   Not on file  Social History Narrative   Not on file   Social Drivers of Health   Tobacco Use: Low Risk (08/22/2024)   Patient History    Smoking Tobacco Use: Never    Smokeless Tobacco Use: Never    Passive Exposure: Never  Physicist, Medical  Strain: Low Risk (10/27/2023)   Overall Financial Resource Strain (CARDIA)    Difficulty of Paying Living Expenses: Not very hard  Food Insecurity: No Food Insecurity (10/27/2023)   Hunger Vital Sign    Worried About Running Out of Food in the Last Year: Never true    Ran Out of Food in the Last Year: Never true  Transportation Needs: No Transportation Needs (10/27/2023)   PRAPARE - Administrator, Civil Service (Medical): No    Lack of Transportation (Non-Medical): No  Physical Activity: Insufficiently Active (10/27/2023)   Exercise Vital Sign    Days of Exercise per Week: 4 days    Minutes of Exercise per Session: 30 min  Stress: Stress Concern Present (10/27/2023)   [employer] of Occupational Health - Occupational Stress Questionnaire    Feeling of Stress : To some  extent  Social Connections: Socially Integrated (10/27/2023)   Social Connection and Isolation Panel    Frequency of Communication with Friends and Family: Three times a week    Frequency of Social Gatherings with Friends and Family: Once a week    Attends Religious Services: More than 4 times per year    Active Member of Clubs or Organizations: Yes    Attends Banker Meetings: More than 4 times per year    Marital Status: Married  Catering Manager Violence: Not At Risk (09/27/2023)   Humiliation, Afraid, Rape, and Kick questionnaire    Fear of Current or Ex-Partner: No    Emotionally Abused: No    Physically Abused: No    Sexually Abused: No  Depression (PHQ2-9): Low Risk (08/22/2024)   Depression (PHQ2-9)    PHQ-2 Score: 2  Alcohol Screen: Low Risk (10/27/2023)   Alcohol Screen    Last Alcohol Screening Score (AUDIT): 1  Housing: Low Risk (10/27/2023)   Housing Stability Vital Sign    Unable to Pay for Housing in the Last Year: No    Number of Times Moved in the Last Year: 0    Homeless in the Last Year: No  Utilities: Not At Risk (09/27/2023)   [employer] Utilities    Threatened with loss of utilities: No  Health Literacy: Adequate Health Literacy (09/27/2023)   B1300 Health Literacy    Frequency of need for help with medical instructions: Never    Past Surgical History:  Procedure Laterality Date   ABDOMINAL HYSTERECTOMY     After ectopic pregnancy   APPENDECTOMY  1990   BREAST SURGERY  8030,8022, 1989   Multiple breat surgeries over the years   CARDIAC CATHETERIZATION     I think about 10 years ago. It was negative.   CARPAL TUNNEL RELEASE  08/2008   left   CHOLECYSTECTOMY  1999   COSMETIC SURGERY  1989 & 2012   Subq mastectomies and incapacitation of implants   ERCP     HEEL SPUR RESECTION Left 09/29/2021   Procedure: LEFT HEEL SPUR EXCISION AND ACHILLES TENDON REPAIR;  Surgeon: Sheril Coy, MD;  Location: [facility];  Service: Orthopedics;  Laterality: Left;    LEFT HEART CATHETERIZATION WITH CORONARY ANGIOGRAM N/A 06/08/2013   Procedure: LEFT HEART CATHETERIZATION WITH CORONARY ANGIOGRAM;  Surgeon: Mickey Aleene JINNY Alveta, MD;  Location: [facility] C[facility];  Service: Cardiovascular;  Laterality: N/A;   MASTECTOMY     bilateral for severe macrocystic breast   SPINE SURGERY N/A 02/12/2020   neck   TUBAL LIGATION      Family History  Problem Relation Age of  Onset   Brain cancer Mother        hospice   Diabetes Mother    Hypertension Mother    Varicose Veins Mother    Diabetes Father    Hypertension Father    Cancer Father        prostate and leukemia   Hearing loss Father    Breast cancer Sister 64   Cancer Sister    Cancer Maternal Aunt        Pancreatic   Diabetes Maternal Uncle    Heart disease Maternal Uncle    Heart disease Maternal Grandmother    Hyperlipidemia Maternal Grandmother    Stroke Maternal Grandmother    Obesity Maternal Grandfather    Cancer Sister     Allergies[1]  Medications Ordered Prior to Encounter[2]  BP 128/68   Pulse 76   Temp 98 F (36.7 C) (Oral)   Ht 5' 3.25 (1.607 m)   Wt 175 lb 4 oz (79.5 kg)   SpO2 94%   BMI 30.80 kg/m  Objective:   Physical Exam Eyes:     Extraocular Movements: Extraocular movements intact.  Cardiovascular:     Rate and Rhythm: Normal rate and regular rhythm.  Pulmonary:     Effort: Pulmonary effort is normal.     Breath sounds: Normal breath sounds.  Musculoskeletal:     Cervical back: Neck supple.  Skin:    General: Skin is warm and dry.  Neurological:     Mental Status: She is alert and oriented to person, place, and time.     Cranial Nerves: No cranial nerve deficit.     Coordination: Coordination normal.  Psychiatric:        Mood and Affect: Mood normal.     Physical Exam        Assessment & Plan:  New daily persistent headache Assessment & Plan: Unclear if this is posttraumatic or coincidental to recent head trauma.  Regardless, a persistent  headache with trauma warrants evaluation, especially with ocular changes. Fortunately, neuroexam is unremarkable Stat MRI brain ordered and pending.  She agrees.  IM Toradol  60 mg provided today in clinic. Consider prednisone  prescription  Await results.  Orders: -     Ketorolac  Tromethamine  -     MR BRAIN WO CONTRAST; Future    Assessment and Plan Assessment & Plan         Comer MARLA Gaskins, NP       [1]  Allergies Allergen Reactions   Atorvastatin Other (See Comments)    REACTION: rash   Ezetimibe Other (See Comments)    REACTION: muscle aches   Icosapent  Ethyl (Epa Ethyl Ester) (Fish) Other (See Comments)    Arm/leg weakness, SOB   Metformin  And Related Other (See Comments)    Muscle cramps- can tolerate    Polyoxyethylene 40 Sorbitol Septaoleate [Sorbitan] Other (See Comments)   Prednisone  Other (See Comments)    **INJECTIONS ONLY** caused tachycardia    Rosuvastatin  Other (See Comments)    Fatigue and weakness   Shingrix [Zoster Vac Recomb Adjuvanted]     Reaction ?  Weakness on one side    Sulfamethoxazole-Trimethoprim Other (See Comments)    Swelling of nostrils  [2]  Current Outpatient Medications on File Prior to Visit  Medication Sig Dispense Refill   ALPRAZolam  (XANAX ) 1 MG tablet TAKE 1/2 TABLET BY MOUTH AT BEDTIME AS NEEDED FOR ANXIETY OR SLEEP 15 tablet 3   ASPERCREME LIDOCAINE  EX Apply 1 application  topically at bedtime as  needed (foot pain).     aspirin  EC 81 MG tablet Take 81 mg by mouth daily. Swallow whole.     Blood Glucose Monitoring Suppl (ACCU-CHEK GUIDE ME) w/Device KIT Use to check blood sugar once daily  (dx. E11.9) 1 kit 0   Calcium -Magnesium -Vitamin D  (CALCIUM  1200+D3 PO) Take 1 tablet by mouth at bedtime.     Cholecalciferol (D3-1000 PO) Take 1 capsule by mouth daily.     cyclobenzaprine  (FLEXERIL ) 10 MG tablet TAKE 0.5 TABLETS BY MOUTH AT BEDTIME AS NEEDED FOR MUSCLE SPASMS (BACK PAIN). CAUTION OF SEDATION 30 tablet 1    diclofenac  Sodium (VOLTAREN ) 1 % GEL APPLY 4 GRAMS TO AFFECTED AREA 4 TIMES DAILY AS NEEDED 200 g 11   Evolocumab  (REPATHA  SURECLICK) 140 MG/ML SOAJ Inject 140 mg into the skin every 14 (fourteen) days. 2 mL 12   Glucose Blood (BLOOD GLUCOSE TEST STRIPS) STRP 1 each by In Vitro route daily. May substitute to any manufacturer covered by patient's insurance. 100 strip 3   Lancet Device MISC 1 each by Does not apply route daily. May substitute to any manufacturer covered by patient's insurance. 1 each 0   Lancets Misc. MISC 1 each by Does not apply route daily. May substitute to any manufacturer covered by patient's insurance. 100 each 3   Melatonin 10 MG CAPS Take 10 mg by mouth at bedtime.     metFORMIN  (GLUCOPHAGE -XR) 500 MG 24 hr tablet Take 1 tablet (500 mg total) by mouth daily with breakfast. 90 tablet 3   metoprolol  succinate (TOPROL -XL) 25 MG 24 hr tablet Take 0.5 tablets (12.5 mg total) by mouth daily. 45 tablet 3   Multiple Vitamins-Minerals (EYE VITAMINS PO) Take 1 capsule by mouth daily.     Omega-3 Fatty Acids (FISH OIL PO) Take 1 capsule by mouth daily.     pantoprazole  (PROTONIX ) 40 MG tablet Take 1 tablet (40 mg total) by mouth 2 (two) times daily. 200 tablet 2   triamcinolone  cream (KENALOG ) 0.1 % APPLY 1 APPLICATION TOPICALLY 2 (TWO) TIMES DAILY AS NEEDED. RASH 30 g 3   No current facility-administered medications on file prior to visit.   "

## 2024-08-22 NOTE — Patient Instructions (Signed)
 You will receive a phone call regarding the MRI of your brain  Will be in touch once we receive the results.  It was a pleasure meeting you!

## 2024-08-22 NOTE — Assessment & Plan Note (Addendum)
 Unclear if this is posttraumatic or coincidental to recent head trauma.  Regardless, a persistent headache with trauma warrants evaluation, especially with ocular changes. Fortunately, neuroexam is unremarkable Stat MRI brain ordered and pending.  She agrees.  IM Toradol  60 mg provided today in clinic. Consider prednisone  prescription  Await results.

## 2024-08-23 ENCOUNTER — Ambulatory Visit: Payer: Self-pay | Admitting: Family Medicine

## 2024-08-23 DIAGNOSIS — D649 Anemia, unspecified: Secondary | ICD-10-CM

## 2024-08-24 ENCOUNTER — Ambulatory Visit: Payer: Self-pay | Admitting: Primary Care

## 2024-08-25 MED ORDER — POLYSACCHARIDE IRON COMPLEX 150 MG PO CAPS: 150.0000 mg | ORAL_CAPSULE | Freq: Every day | ORAL | 1 refills | Status: DC

## 2024-08-25 NOTE — Assessment & Plan Note (Signed)
 Neg GI work up Normal retic ct Will try oral iron 

## 2024-08-28 ENCOUNTER — Ambulatory Visit: Admitting: Family Medicine

## 2024-09-16 ENCOUNTER — Other Ambulatory Visit: Payer: Self-pay | Admitting: Family Medicine

## 2024-09-21 ENCOUNTER — Other Ambulatory Visit (INDEPENDENT_AMBULATORY_CARE_PROVIDER_SITE_OTHER)

## 2024-09-21 DIAGNOSIS — D649 Anemia, unspecified: Secondary | ICD-10-CM | POA: Diagnosis not present

## 2024-09-21 LAB — CBC WITH DIFFERENTIAL/PLATELET
Basophils Absolute: 0 K/uL (ref 0.0–0.1)
Basophils Relative: 0.6 % (ref 0.0–3.0)
Eosinophils Absolute: 0.2 K/uL (ref 0.0–0.7)
Eosinophils Relative: 3.6 % (ref 0.0–5.0)
HCT: 33.5 % — ABNORMAL LOW (ref 36.0–46.0)
Hemoglobin: 10.9 g/dL — ABNORMAL LOW (ref 12.0–15.0)
Lymphocytes Relative: 38.8 % (ref 12.0–46.0)
Lymphs Abs: 2 K/uL (ref 0.7–4.0)
MCHC: 32.7 g/dL (ref 30.0–36.0)
MCV: 70.4 fl — ABNORMAL LOW (ref 78.0–100.0)
Monocytes Absolute: 0.6 K/uL (ref 0.1–1.0)
Monocytes Relative: 10.8 % (ref 3.0–12.0)
Neutro Abs: 2.4 K/uL (ref 1.4–7.7)
Neutrophils Relative %: 46.2 % (ref 43.0–77.0)
Platelets: 277 K/uL (ref 150.0–400.0)
RBC: 4.76 Mil/uL (ref 3.87–5.11)
RDW: 18.3 % — ABNORMAL HIGH (ref 11.5–15.5)
WBC: 5.1 K/uL (ref 4.0–10.5)

## 2024-09-21 LAB — IRON: Iron: 63 ug/dL (ref 42–145)

## 2024-09-21 LAB — FERRITIN: Ferritin: 8.5 ng/mL — ABNORMAL LOW (ref 10.0–291.0)

## 2024-09-23 ENCOUNTER — Ambulatory Visit: Payer: Self-pay | Admitting: Family Medicine

## 2024-09-23 DIAGNOSIS — D649 Anemia, unspecified: Secondary | ICD-10-CM

## 2024-09-23 DIAGNOSIS — E611 Iron deficiency: Secondary | ICD-10-CM

## 2024-09-23 NOTE — Assessment & Plan Note (Signed)
 Normal EGD and colonoscopy  On niferex- iron  level is improved but HGB not increased yet  Lab Results  Component Value Date   IRON  63 09/21/2024   FERRITIN 8.5 (L) 09/21/2024   Lab Results  Component Value Date   WBC 5.1 09/21/2024   HGB 10.9 (L) 09/21/2024   HCT 33.5 (L) 09/21/2024   MCV 70.4 (L) 09/21/2024   PLT 277.0 09/21/2024

## 2024-09-24 ENCOUNTER — Other Ambulatory Visit

## 2024-09-24 DIAGNOSIS — E611 Iron deficiency: Secondary | ICD-10-CM | POA: Insufficient documentation

## 2024-09-27 ENCOUNTER — Ambulatory Visit: Payer: Medicare Other

## 2024-09-28 ENCOUNTER — Ambulatory Visit

## 2024-10-24 ENCOUNTER — Inpatient Hospital Stay

## 2024-10-24 ENCOUNTER — Inpatient Hospital Stay: Admitting: Oncology

## 2024-10-30 ENCOUNTER — Ambulatory Visit: Admitting: Family Medicine

## 2024-11-13 ENCOUNTER — Ambulatory Visit
